# Patient Record
Sex: Female | Born: 1950
Health system: Southern US, Community
[De-identification: ages and names within clinical notes are randomized; demographics above are authoritative.]

## PROBLEM LIST (undated history)

## (undated) DIAGNOSIS — M549 Dorsalgia, unspecified: Secondary | ICD-10-CM

## (undated) DIAGNOSIS — M797 Fibromyalgia: Secondary | ICD-10-CM

## (undated) DIAGNOSIS — F329 Major depressive disorder, single episode, unspecified: Secondary | ICD-10-CM

## (undated) DIAGNOSIS — K279 Peptic ulcer, site unspecified, unspecified as acute or chronic, without hemorrhage or perforation: Secondary | ICD-10-CM

## (undated) DIAGNOSIS — E669 Obesity, unspecified: Secondary | ICD-10-CM

## (undated) DIAGNOSIS — M25569 Pain in unspecified knee: Secondary | ICD-10-CM

## (undated) DIAGNOSIS — M1990 Unspecified osteoarthritis, unspecified site: Secondary | ICD-10-CM

## (undated) DIAGNOSIS — I10 Essential (primary) hypertension: Secondary | ICD-10-CM

## (undated) DIAGNOSIS — E7800 Pure hypercholesterolemia, unspecified: Secondary | ICD-10-CM

## (undated) DIAGNOSIS — G8929 Other chronic pain: Secondary | ICD-10-CM

## (undated) DIAGNOSIS — E119 Type 2 diabetes mellitus without complications: Secondary | ICD-10-CM

## (undated) DIAGNOSIS — F32A Depression, unspecified: Secondary | ICD-10-CM

## (undated) DIAGNOSIS — I471 Supraventricular tachycardia, unspecified: Secondary | ICD-10-CM

## (undated) DIAGNOSIS — F419 Anxiety disorder, unspecified: Secondary | ICD-10-CM

## (undated) DIAGNOSIS — J660 Byssinosis: Secondary | ICD-10-CM

## (undated) DIAGNOSIS — K219 Gastro-esophageal reflux disease without esophagitis: Secondary | ICD-10-CM

## (undated) DIAGNOSIS — E876 Hypokalemia: Secondary | ICD-10-CM

## (undated) DIAGNOSIS — C801 Malignant (primary) neoplasm, unspecified: Secondary | ICD-10-CM

## (undated) HISTORY — DX: Hypokalemia: E87.6

## (undated) HISTORY — PX: GANGLION CYST EXCISION: SHX1691

## (undated) HISTORY — DX: Dorsalgia, unspecified: M54.9

## (undated) HISTORY — DX: Peptic ulcer, site unspecified, unspecified as acute or chronic, without hemorrhage or perforation: K27.9

## (undated) HISTORY — DX: Depression, unspecified: F32.A

## (undated) HISTORY — DX: Supraventricular tachycardia, unspecified: I47.10

## (undated) HISTORY — DX: Obesity, unspecified: E66.9

## (undated) HISTORY — DX: Byssinosis: J66.0

## (undated) HISTORY — DX: Hypomagnesemia: E83.42

## (undated) HISTORY — PX: BREAST SURGERY: SHX581

## (undated) HISTORY — DX: Pure hypercholesterolemia, unspecified: E78.00

## (undated) HISTORY — DX: Supraventricular tachycardia: I47.1

## (undated) HISTORY — PX: TOTAL ABDOMINAL HYSTERECTOMY: SHX209

## (undated) HISTORY — DX: Other chronic pain: G89.29

## (undated) HISTORY — PX: DILATION AND CURETTAGE OF UTERUS: SHX78

## (undated) HISTORY — DX: Fibromyalgia: M79.7

## (undated) HISTORY — PX: APPENDECTOMY: SHX54

## (undated) HISTORY — PX: OTHER SURGICAL HISTORY: SHX169

## (undated) HISTORY — DX: Pain in unspecified knee: M25.569

## (undated) HISTORY — DX: Essential (primary) hypertension: I10

## (undated) HISTORY — DX: Anxiety disorder, unspecified: F41.9

## (undated) HISTORY — DX: Major depressive disorder, single episode, unspecified: F32.9

## (undated) HISTORY — DX: Gastro-esophageal reflux disease without esophagitis: K21.9

## (undated) HISTORY — PX: CHOLECYSTECTOMY: SHX55

---

## 1998-08-16 ENCOUNTER — Other Ambulatory Visit: Admission: RE | Admit: 1998-08-16 | Discharge: 1998-08-16 | Payer: Self-pay | Admitting: General Surgery

## 2002-03-08 ENCOUNTER — Emergency Department (HOSPITAL_COMMUNITY): Admission: EM | Admit: 2002-03-08 | Discharge: 2002-03-08 | Payer: Self-pay | Admitting: Emergency Medicine

## 2004-04-26 ENCOUNTER — Inpatient Hospital Stay (HOSPITAL_BASED_OUTPATIENT_CLINIC_OR_DEPARTMENT_OTHER): Admission: RE | Admit: 2004-04-26 | Discharge: 2004-04-26 | Payer: Self-pay | Admitting: Cardiovascular Disease

## 2005-05-31 ENCOUNTER — Ambulatory Visit: Payer: Self-pay | Admitting: Gastroenterology

## 2005-06-10 ENCOUNTER — Ambulatory Visit: Payer: Self-pay | Admitting: Internal Medicine

## 2005-06-10 ENCOUNTER — Ambulatory Visit (HOSPITAL_COMMUNITY): Admission: RE | Admit: 2005-06-10 | Discharge: 2005-06-10 | Payer: Self-pay | Admitting: Internal Medicine

## 2005-06-10 ENCOUNTER — Encounter (INDEPENDENT_AMBULATORY_CARE_PROVIDER_SITE_OTHER): Payer: Self-pay | Admitting: Internal Medicine

## 2005-07-01 ENCOUNTER — Ambulatory Visit: Payer: Self-pay | Admitting: Cardiology

## 2005-07-22 ENCOUNTER — Ambulatory Visit (HOSPITAL_COMMUNITY): Admission: RE | Admit: 2005-07-22 | Discharge: 2005-07-22 | Payer: Self-pay | Admitting: Internal Medicine

## 2007-12-03 ENCOUNTER — Ambulatory Visit (HOSPITAL_COMMUNITY): Admission: RE | Admit: 2007-12-03 | Discharge: 2007-12-03 | Payer: Self-pay | Admitting: Orthopaedic Surgery

## 2008-12-27 ENCOUNTER — Ambulatory Visit (HOSPITAL_COMMUNITY): Admission: RE | Admit: 2008-12-27 | Discharge: 2008-12-27 | Payer: Self-pay | Admitting: Obstetrics & Gynecology

## 2009-01-16 ENCOUNTER — Ambulatory Visit: Payer: Self-pay | Admitting: Cardiology

## 2009-01-17 ENCOUNTER — Ambulatory Visit: Payer: Self-pay | Admitting: Cardiology

## 2009-01-17 ENCOUNTER — Ambulatory Visit (HOSPITAL_COMMUNITY): Admission: AD | Admit: 2009-01-17 | Discharge: 2009-01-17 | Payer: Self-pay | Admitting: Cardiology

## 2009-07-18 ENCOUNTER — Emergency Department (HOSPITAL_COMMUNITY): Admission: EM | Admit: 2009-07-18 | Discharge: 2009-07-18 | Payer: Self-pay | Admitting: Emergency Medicine

## 2010-12-16 ENCOUNTER — Encounter (INDEPENDENT_AMBULATORY_CARE_PROVIDER_SITE_OTHER): Payer: Self-pay | Admitting: Internal Medicine

## 2011-03-02 LAB — POCT CARDIAC MARKERS
Myoglobin, poc: 77.8 ng/mL (ref 12–200)
Troponin i, poc: <0.05 ng/mL (ref 0.00–0.09)

## 2011-03-12 LAB — GLUCOSE, CAPILLARY: Glucose-Capillary: 186 mg/dL — ABNORMAL HIGH (ref 70–99)

## 2011-04-09 NOTE — Discharge Summary (Signed)
Kelli Wilson, Kelli Wilson              ACCOUNT NO.:  [id number]   MEDICAL RECORD NO.:  [id number]          PATIENT TYPE:  OIB   LOCATION:  2899                         FACILITY:  [facility]   PHYSICIAN:  Vanna Scotland. Olevia Perches, MD, FACCDATE OF BIRTH:  04-Aug-1951   DATE OF ADMISSION:  01/16/2009  DATE OF DISCHARGE:  01/17/2009                               DISCHARGE SUMMARY   PRIMARY CARDIOLOGIST:  Satira Sark, MD   DISCHARGE DIAGNOSIS:  Noncardiac chest pain.   SECONDARY DIAGNOSES:  1. Chronic obstructive pulmonary disease.  2. Borderline diabetes.  3. Hypokalemia.  4. Morbid obesity.  5. Gastroesophageal reflux disease.  6. Osteoarthritis.  7. Fibromyalgia.   ALLERGIES:  1. TRAMADOL  2. MORPHINE.  3. LIPITOR.  4. PREVACID  5. SULFONAMIDE.  6. NSAIDs.  7. FLEXERIL.  8. SULFA.   PROCEDURES PERFORMED DURING THIS HOSPITALIZATION:  1. EKG showed normal sinus rhythm with a rate of 78 beats per minute.      No acute ST-T wave changes.  No significant Q-waves.  Normal axis.      No evidence of hypertrophy.  PR 132.  QRS 92.  QTc 433.  2. Cardiac catheterization on January 17, 2009, that revealed normal      coronaries and normal LV function.   BRIEF HISTORY OF PRESENT ILLNESS:  This is a 60 year old white female  with a history of diabetes, hypertension, dyslipidemia, morbid obesity,  and COPD. She came to the emergency room at [facility] on  January 16, 2009, complaining of chest pain that was sharp, pressure-  like, radiating to the left arm, lasting for a few minutes, resolving  and then returning.  No associated diaphoresis.  Not exacerbated by  exertion or rest.  The patient reports good medication compliance.   SURGICAL HISTORY:  1. Appendectomy.  2. Cholecystectomy.  3. Hysterectomy.   HOSPITAL COURSE:  The patient was transferred from [facility] to [facility] to undergo diagnostic cardiac catheterization.  The  patient tolerated the procedure well  without any significant  complications.  The patient was found to have normal coronaries and  normal LV function.  Please see results above.  The patient will be  transferred from to the Short Stay and will be discharged at 6:30 p.m.  pending her ability to ambulate without significant symptoms or any  other complications.  Prior to discharge, the patient will be given her  post-cath instructions as well as followup instructions to see her  primary care within the next 2 weeks.  All of her questions or concerns  will be addressed prior to her discharge. (Should any changes, occur  please see addendum.)   DISCHARGE LABORATORY DATA:  CK 91, CK-MB 1.0, and troponin I of less  than 0.01.  PT 13.0, INR of 1.0, and aPTT 26.0.  WBC 13.3, HGB 14.6, HCT  41.5, and PLT count 314.  Sodium 137, potassium 3.3, chloride 101, CO2  26.0, BUN 12, creatinine 1.10, and glucose 188.   FOLLOWUP PLANS AND APPOINTMENTS:  The patient will be instructed to make  a followup appointment with her primary care  doctor within the next 2  weeks.  The patient will be scheduled for a post-cath followup  appointment with Dr. Domenic Polite within the next 2-3 weeks.   DISCHARGE MEDICATIONS:  No changes.  1. Alprazolam 1 mg p.o. q.i.d. p.r.n.  2. Advair Diskus 1 inhalation b.i.d.  3. Albuterol nebulizer 1 vial q.4 h. p.r.n.  4. Vicodin 5/325 mg p.o. p.r.n.  5. Vistaril 25 mg p.o. q.4 h.  6. Protonix 40 mg p.o. daily.  7. DuoNeb 1 inhalation q.4 h. p.r.n.   Duration of discharge encounter including physician time was 35 minutes.      Guss Bunde, PAC      Bruce R. Olevia Perches, MD, [facility]  Electronically Signed    MS/MEDQ  D:  01/17/2009  T:  01/18/2009  Job:  [id number]   cc:   Satira Sark, MD

## 2011-04-12 NOTE — Op Note (Signed)
NAMEDELANNY, [name]   MEDICAL RECORD NO.:  [id number]          PATIENT TYPE:  AMB   LOCATION:  DAY                           FACILITY:  [facility]   PHYSICIAN:  Hildred Laser, M.D.    DATE OF BIRTH:  11/20/1951   DATE OF PROCEDURE:  06/10/2005  DATE OF DISCHARGE:                                 OPERATIVE REPORT   PROCEDURE:  Esophagogastroduodenoscopy followed by colonoscopy.   INDICATION:  Kelli Wilson is a 60 year old Caucasian female with multiple medical  problems with chronic GERD with frequent breakthrough symptoms.  She also  has intermittent nausea, vomiting, as well as epigastric and left upper  quadrant pain.  She also has noted change in her bowel habits.  Some days  she has diarrhea.  Other days she feels she is constipated and she also  passes pencil thin stools and gives history of melena.  She had colonic  adenoma removed in 02/2001.   Procedure risks were reviewed with the patient and informed consent was  obtained.   PREOPERATIVE MEDICATIONS:  Cetacaine spray pharyngeal topical anesthesia,  Demerol 50 mg IV, Versed 17 mg IV.   FINDINGS/PROCEDURES PERFORMED IN ENDOSCOPY SUITE:  The patient's vital signs  and O2 sat were monitored during the procedure and remained stable.   PROCEDURE:  1.  Esophagogastroduodenoscopy.  The patient was placed in the left lateral      position and Olympus videoscope was passed into the oropharynx without      any difficulty into esophagus.   Esophagus:  Mucosa of the esophagus normal.  There was a diamond shaped  patch of gastric type mucosa at distal esophagus which was biopsied for  histology to make sure this was not early Barrett's.  GE junction at 39 and  hiatus was at 41 cm.   Stomach:  It was empty and distended very well with insufflation.  Folds of  proximal stomach are normal.  Examination of mucosa of body, antrum, pyloric  channel as well as angularis, fundus and cardia was normal.   Duodenum:  Bulbar mucosa was normal.  The scope was passed to the second  bulb of the duodenal mucosa and folds were normal.  Endoscope was withdrawn  and the patient prepared for procedure number two.   Colonoscopy:  Rectal exam was performed.  No abnormality noted on external  or digital exam.  Olympus video scope was placed in the rectum and advanced  under vision into the sigmoid colon beyond.  Redundant colon with suboptimal  prep.  She still had some thick liquid stool.  A few diverticula were noted  at the sigmoid descending colon and some at the hepatic flexure ascending  colon.  I was able to see the valve from a distance but never could see the  blunt end.  The patient was turned in different positions.  She was  constantly complaining of pain or cramps during colonoscopy and was never  well sedated.  As the scope was withdrawn, colonic mucosa was carefully  examined and there were no other abnormalities.  Rectal mucosa was normal.  The  scope was retroflexed to examine anorectal junction which was  unremarkable.  The endoscope was then withdrawn.  The patient appeared to be  comfortable at the end of the procedure.   FINAL DIAGNOSES:  1.Small sliding hiatal hernia, small diamond shaped patch  of gastric type mucosa distal esophagus which was biopsied for histology to  make sure she does not have Barrett's.  1.  No evidence of peptic ulcer disease.  2.  Scattered diverticula at sigmoid, descending colon as well as the      ascending colon and hepatic flexure.  3.  blunt end of cecum was not seen.   RECOMMENDATIONS:  1.  She will continue antireflux measures and a PPI as before.  2.  Dicyclomine 20 mg before each meal.  3.  FiberChoice two tablets q.d.  4.  Will bring her back for barium enema at a later date.       NR/MEDQ  D:  06/10/2005  T:  06/10/2005  Job:  [id number]   cc:   Ara Kussmaul  [place] [id number]  Fax: [phone]

## 2011-04-12 NOTE — Consult Note (Signed)
[name], [name]              ACCOUNT NO.:  [id number]   MEDICAL RECORD NO.:  [id number]         PATIENT TYPE:  AMB   LOCATION:                                FACILITY:  [facility]   PHYSICIAN:  Hildred Laser, M.D.    DATE OF BIRTH:  1951-08-09   DATE OF CONSULTATION:  05/31/2005  DATE OF DISCHARGE:                                   CONSULTATION   CHIEF COMPLAINT:  Acid reflux, history of polyps.   HISTORY OF PRESENT ILLNESS:  The patient is a 60 year old Caucasian female  patient of Dr. Wiliam Ke who presents today for further evaluation of  above-stated symptoms.  She has had chronic gastroesophageal reflux disease.  Over the last 2-3 months she has had a flare-up of her symptoms.  She also  has nausea and vomiting intermittently and has been taking her daughter's  Reglan with some relief.  She often wakes up in the morning with nausea.  The smell of food makes her nauseated.  On occasion she does vomit.  She has  been having some left upper quadrant swelling and discomfort which is often  followed by abdominal cramps and diarrhea.  She often breaks out in a cold  sweat when she gets severe abdominal cramps.  She has fecal urgency.  She  complains of nocturnal acid reflux.  She has been sleeping on three pillows  underneath her head.  She notes dark carbonated beverages cause acid reflux.  For the most part her heartburn is controlled on Protonix which she takes  most days.  Her bowel movements are becoming worse over the last several  months.  She may go from having just one bowel movement a day to having  multiple postprandial stools.  Her stool caliber is pencil thin.  She states  she has black stools intermittently, especially when she has abdominal pain.  She denies any heartburn.   CURRENT MEDICATIONS:  1.  Serevent p.r.n.  2.  Flovent p.r.n.  3.  Albuterol inhaler p.r.n.  4.  Advair 2 puffs b.i.d.  5.  Protonix 40 mg daily p.r.n.  6.  Reglan 5 mg every other day.  7.  Xanax 1 mg q.i.d.  8.  Darvocet-N 100 p.r.n.   ALLERGIES:  MOTRIN, SULFA, CHOLESTEROL MEDICATIONS.   PAST MEDICAL HISTORY:  COPD.  Hypertension.  Borderline diabetes.  Fibromyalgia.  Gastroesophageal reflux disease.  Arthritis.  Depression.  Obesity.  History of tubular adenoma removed in April 2002 from the cecum;  it measured 5-6 mm.  She has also had EGD, last time in 2002, which revealed  erosive reflux esophagitis and a small sliding hiatal hernia but no Barrett  esophagus.   PAST SURGICAL HISTORY:  D&C.  Hysterectomy.  Appendectomy.  Cholecystectomy.  Rectocele and cystocele repair.   FAMILY HISTORY:  She is not aware of her biological family's history; she is  adopted.   SOCIAL HISTORY:  She is married and has 3 living children.  She is disabled.  She has never been a smoker, but her husband smokes around her.  No alcohol  use.   REVIEW OF  SYSTEMS:  See HPI for GI.  CARDIOPULMONARY:  She has intermittent  shortness of breath with asthma attacks especially set off by aerosol sprays  and change in weather.  She denies any chest pain.  CONSTITUTIONAL:  Denies  weight loss.   PHYSICAL EXAMINATION:  VITAL SIGNS:  Weight 256, height 5 feet 9-1/2 inches,  temperature 98.1, blood pressure 134/86, pulse 84.  GENERAL:  Pleasant, obese, Caucasian female in no acute distress.  SKIN:  Warm and dry, no jaundice.  HEENT:  Pupils are equal, round and reactive to light.  Conjunctivae are  pink.  Sclerae are nonicteric.  Oropharyngeal mucosa moist and pink, no  lesions, erythema or exudate.  No lymphadenopathy or thyromegaly.  CHEST:  Lungs are clear to auscultation.  CARDIAC:  Reveals a regular rate and rhythm, normal S1 and S2, no murmurs,  rubs or gallops.  ABDOMEN:  Positive bowel sounds, obese but symmetrical.  Soft.  Mild diffuse  abdominal tenderness to deep palpation, no organomegaly or masses, no  rebound tenderness or guarding, no abdominal bruits or hernias.  RECTAL:   Deferred per patient's request.  EXTREMITIES:  No edema.   IMPRESSION:  Patient is a 60 year old lady lady with multiple gastrointestinal  issues including chronic gastroesophageal reflux disease with intermittent  nausea, vomiting and upper abdominal discomfort.  She also has intermittent  postprandial diarrhea and subjective change in stool caliber.  She reports  having intermittent black stools associated with abdominal pain as well.  She has a history of tubular adenoma and recommendations have been for  followup colonoscopy in 6 years which would be early 2008.  Given change in  bowel pattern and patient's request for surveillance exam, it is reasonable  to proceed with colonoscopy at this time.  She also needs to have an upper  endoscopy for further evaluation of her GI symptoms and melena.   PLAN:  Colonoscopy and EGD in the near future.  Would have her stop Protonix  and try Zegerid 40 mg daily #20 samples given.      LL/MEDQ  D:  05/31/2005  T:  05/31/2005  Job:  [id number]   cc:   Wiliam Ke  [place] [id number]  Fax: [phone]

## 2011-04-12 NOTE — Cardiovascular Report (Signed)
NAMECHAYLIN, Kelli Wilson NO.:  [id number]   MEDICAL RECORD NO.:  [id number]                   PATIENT TYPE:  OIB   LOCATION:  6501                                 FACILITY:  [facility]   PHYSICIAN:  Jenkins Rouge, M.D.                  DATE OF BIRTH:  1951/11/05   DATE OF PROCEDURE:  04/26/2004  DATE OF DISCHARGE:                              CARDIAC CATHETERIZATION   INDICATION:  Recurrent chest pain.   PROCEDURE:  Standard catheterization was done from the right femoral artery.  The patient was sedated with 2 mg of Versed.  4 French catheters were used.   The left main coronary artery was normal.   Left anterior descending artery was normal.  There was one large first  diagonal branch which was normal.   Circumflex coronary artery was normal.   Right coronary artery was large and dominant.  It was normal.   PRESSURES:  Aortic pressure was 137/80, LV pressure was 136/2.   There was no significant gradient across the aortic valve and no MR.   IMPRESSION:  The patient has no critical coronary artery disease.  She will  follow up with her general medical care with Dr. __________.  She will see  Roque Cash in the [facility] on Monday for groin check.   The patient tolerated the procedure well.                                               Jenkins Rouge, M.D.    PN/MEDQ  D:  04/26/2004  T:  04/26/2004  Job:  [id number]   cc:   Dr. Susette Racer R. Duran, P.A.

## 2011-08-07 ENCOUNTER — Encounter (INDEPENDENT_AMBULATORY_CARE_PROVIDER_SITE_OTHER): Payer: Self-pay

## 2011-08-07 ENCOUNTER — Encounter (INDEPENDENT_AMBULATORY_CARE_PROVIDER_SITE_OTHER): Payer: Self-pay | Admitting: *Deleted

## 2011-10-01 ENCOUNTER — Ambulatory Visit (INDEPENDENT_AMBULATORY_CARE_PROVIDER_SITE_OTHER): Payer: Self-pay | Admitting: Internal Medicine

## 2012-02-11 DIAGNOSIS — I10 Essential (primary) hypertension: Secondary | ICD-10-CM | POA: Insufficient documentation

## 2012-02-11 DIAGNOSIS — F329 Major depressive disorder, single episode, unspecified: Secondary | ICD-10-CM

## 2012-02-11 DIAGNOSIS — J660 Byssinosis: Secondary | ICD-10-CM

## 2012-02-11 DIAGNOSIS — E7800 Pure hypercholesterolemia, unspecified: Secondary | ICD-10-CM

## 2012-02-11 DIAGNOSIS — M797 Fibromyalgia: Secondary | ICD-10-CM

## 2012-02-11 DIAGNOSIS — F419 Anxiety disorder, unspecified: Secondary | ICD-10-CM | POA: Insufficient documentation

## 2012-02-11 DIAGNOSIS — K219 Gastro-esophageal reflux disease without esophagitis: Secondary | ICD-10-CM | POA: Insufficient documentation

## 2013-01-28 ENCOUNTER — Encounter: Payer: Self-pay | Admitting: Cardiovascular Disease

## 2015-12-28 ENCOUNTER — Telehealth: Payer: Self-pay | Admitting: *Deleted

## 2015-12-28 MED ORDER — HYDROCODONE-ACETAMINOPHEN 7.5-325 MG PO TABS: 1.0000 | ORAL_TABLET | ORAL | Status: DC | PRN

## 2015-12-28 NOTE — Telephone Encounter (Signed)
Requesting refill on Hydrocodone. °

## 2015-12-28 NOTE — Telephone Encounter (Signed)
Rx printed

## 2015-12-28 NOTE — Addendum Note (Signed)
Addended by: Sanjuana Kava on: 12/28/2015 11:27 AM   Modules accepted: Orders

## 2016-01-29 ENCOUNTER — Telehealth: Payer: Self-pay | Admitting: Orthopaedic Surgery

## 2016-01-29 MED ORDER — HYDROCODONE-ACETAMINOPHEN 7.5-325 MG PO TABS: 1.0000 | ORAL_TABLET | ORAL | Status: DC | PRN

## 2016-01-29 NOTE — Telephone Encounter (Signed)
Rx done. 

## 2016-01-29 NOTE — Telephone Encounter (Signed)
Norco 7.5-325 mgs. Qty 120  Last filled on 12-28-15

## 2016-02-27 ENCOUNTER — Telehealth: Payer: Self-pay

## 2016-02-27 NOTE — Telephone Encounter (Signed)
Pt needs refill on pain med 7.5 325 Hydro

## 2016-02-28 MED ORDER — HYDROCODONE-ACETAMINOPHEN 7.5-325 MG PO TABS: 1.0000 | ORAL_TABLET | ORAL | Status: DC | PRN

## 2016-02-28 NOTE — Telephone Encounter (Signed)
Rx done. 

## 2016-03-12 ENCOUNTER — Ambulatory Visit (INDEPENDENT_AMBULATORY_CARE_PROVIDER_SITE_OTHER): Payer: Medicare Other | Admitting: Orthopaedic Surgery

## 2016-03-12 ENCOUNTER — Encounter: Payer: Self-pay | Admitting: Orthopaedic Surgery

## 2016-03-12 VITALS — BP 178/91 | HR 87 | Temp 98.2°F | Ht 67.5 in | Wt 291.0 lb

## 2016-03-12 DIAGNOSIS — M545 Low back pain, unspecified: Secondary | ICD-10-CM

## 2016-03-12 DIAGNOSIS — K219 Gastro-esophageal reflux disease without esophagitis: Secondary | ICD-10-CM | POA: Diagnosis not present

## 2016-03-12 DIAGNOSIS — M797 Fibromyalgia: Secondary | ICD-10-CM

## 2016-03-12 NOTE — Progress Notes (Signed)
Subjective: my back is still hurting    Patient ID: Kelli Wilson, female    DOB: 1951-11-18, 65 y.o.   MRN: [id number]  Back Pain This is a chronic problem. The current episode started more than 1 year ago. The problem occurs daily. The problem has been waxing and waning since onset. The pain is present in the lumbar spine. The quality of the pain is described as aching. The pain does not radiate. The pain is at a severity of 4/10. The pain is moderate. The pain is worse during the day. The symptoms are aggravated by bending, twisting and stress. Pertinent negatives include no chest pain. She has tried analgesics, heat, bed rest, home exercises, ice, muscle relaxant and NSAIDs for the symptoms. The treatment provided moderate relief.   She has chronic pain of the lower back with no paresthesias.  She has been doing her exercises.  She has no new trauma.  Her diabetes is well controlled.  Her last A1C was just over 6 she says.  Her COPD has good and bad days depending on the weather and her activity. The pollen has bothered her allergies and her breathing as well.  Review of Systems  Constitutional:       She has diabetes diet controlled. She has COPD  HENT: Positive for congestion.   Respiratory: Positive for shortness of breath. Negative for cough.   Cardiovascular: Negative for chest pain and leg swelling.  Endocrine: Positive for cold intolerance.  Musculoskeletal: Positive for myalgias, back pain and arthralgias.  Allergic/Immunologic: Positive for environmental allergies and food allergies.   Past Medical History  Diagnosis Date  . Hypertension   . Obesity   . Chronic pain   . Anxiety   . Knee pain   . Back pain   . Chest pain   . Hypercholesteremia   . Depression   . Fibromyalgia   . Peptic ulcer     history of - normal GI studies Sept 2010  . GERD (gastroesophageal reflux disease)   . 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 Total abdominal hysterectomy    . Cholecystectomy    . Appendectomy    . Dilation and curettage of uterus    . Bladder rectal repair      Current Outpatient Prescriptions on File Prior to Visit  Medication Sig Dispense Refill  . albuterol (PROVENTIL) (2.5 MG/3ML) 0.083% nebulizer solution Take by nebulization every 6 (six) hours as needed.      . Fluticasone-Salmeterol (ADVAIR) 100-50 MCG/DOSE AEPB Inhale 1 puff into the lungs every 12 (twelve) hours.    Marland Kitchen HYDROcodone-acetaminophen (NORCO) 7.5-325 MG tablet Take 1 tablet by mouth every 4 (four) hours as needed for moderate pain. 120 tablet 0  . hydrOXYzine (ATARAX/VISTARIL) 50 MG tablet Take 100 mg by mouth at bedtime.    . nitroGLYCERIN (NITRODUR - DOSED IN MG/24 HR) 0.2 mg/hr Place 1 patch onto the skin as needed.    Marland Kitchen omeprazole (PRILOSEC) 40 MG capsule Take 40 mg by mouth daily.    Marland Kitchen oxybutynin (DITROPAN) 5 MG tablet Take 5 mg by mouth daily.    . prochlorperazine (COMPAZINE) 10 MG tablet Take 10 mg by mouth every 6 (six) hours as needed.      . citalopram (CELEXA) 10 MG tablet Take 10 mg by mouth daily. Reported on 03/12/2016    . diltiazem (CARDIZEM CD)  240 MG 24 hr capsule Take 120 mg by mouth daily. Reported on 03/12/2016    . gabapentin (NEURONTIN) 300 MG capsule Take 300 mg by mouth 3 (three) times daily. Reported on 03/12/2016     No current facility-administered medications on file prior to visit.    Social History   Social History  . Marital Status: Married    Spouse Name: N/A  . Number of Children: N/A  . Years of Education: N/A   Occupational History  . disabled     disabled from a cotton mill (Byssinosis)   Social History Main Topics  . Smoking status: Never Smoker   . Smokeless tobacco: Never Used  . Alcohol Use: No  . Drug Use: Not on file  . Sexual Activity: Not on file   Other Topics Concern  . Not on file   Social History Narrative    BP 178/91 mmHg  Pulse 87   Temp(Src) 98.2 F (36.8 C)  Ht 5' 7.5" (1.715 m)  Wt 291 lb (131.997 kg)  BMI 44.88 kg/m2     Objective:   Physical Exam  Constitutional: She is oriented to person, place, and time. She appears well-developed and well-nourished.  HENT:  Head: Normocephalic and atraumatic.  Eyes: Conjunctivae and EOM are normal. Pupils are equal, round, and reactive to light.  Neck: Normal range of motion. Neck supple.  Cardiovascular: Normal rate, regular rhythm and intact distal pulses.   Pulmonary/Chest: Effort normal.  Abdominal: Soft.  Musculoskeletal: She exhibits tenderness (lower back is tender.  Forward flexion 30, extension 5, lateral bend normal, SLR normal, no spasm.  NV intact.).       Lumbar back: She exhibits decreased range of motion and tenderness.       Back:  Neurological: She is alert and oriented to person, place, and time. She displays normal reflexes. No cranial nerve deficit. She exhibits normal muscle tone. Coordination normal.  Skin: Skin is warm and dry.  Psychiatric: She has a normal mood and affect. Her behavior is normal. Judgment and thought content normal.      Assessment & Plan:   Encounter Diagnoses  Name Primary?  . Midline low back pain without sciatica Yes  . Fibromyalgia   . Gastroesophageal reflux disease, esophagitis presence not specified   . Morbid obesity due to excess calories [facility])    Continue her medicine.  Call if any problem.  Continue her exercises at home.  Return in three months.

## 2016-03-27 ENCOUNTER — Telehealth: Payer: Self-pay | Admitting: Orthopaedic Surgery

## 2016-03-27 MED ORDER — HYDROCODONE-ACETAMINOPHEN 7.5-325 MG PO TABS: 1.0000 | ORAL_TABLET | ORAL | Status: DC | PRN

## 2016-03-27 NOTE — Telephone Encounter (Signed)
Rx done. 

## 2016-03-27 NOTE — Telephone Encounter (Signed)
Hydrocodone(Norco) 7.5-325 mgs.  Qty 120   Sig: Take 1 tablet by mouth every 4 (four) hours as needed for moderate pain.

## 2016-04-29 ENCOUNTER — Telehealth: Payer: Self-pay | Admitting: Orthopaedic Surgery

## 2016-04-29 NOTE — Telephone Encounter (Signed)
Patient called for refill of medication: HYDROcodone-acetaminophen (NORCO) 7.5-325 MG tablet [id number] - quantity 120.

## 2016-04-30 MED ORDER — HYDROCODONE-ACETAMINOPHEN 7.5-325 MG PO TABS: 1.0000 | ORAL_TABLET | ORAL | Status: DC | PRN

## 2016-04-30 NOTE — Telephone Encounter (Signed)
Rx done. 

## 2016-05-29 ENCOUNTER — Telehealth: Payer: Self-pay | Admitting: Orthopaedic Surgery

## 2016-05-29 MED ORDER — HYDROCODONE-ACETAMINOPHEN 7.5-325 MG PO TABS: 1.0000 | ORAL_TABLET | ORAL | Status: DC | PRN

## 2016-05-29 NOTE — Telephone Encounter (Signed)
Rx done. 

## 2016-05-29 NOTE — Telephone Encounter (Signed)
Patient requests refill on: HYDROcodone-acetaminophen (NORCO) 7.5-325 MG tablet [id number]

## 2016-06-11 ENCOUNTER — Ambulatory Visit: Payer: Medicaid Other | Admitting: Orthopaedic Surgery

## 2016-06-27 ENCOUNTER — Ambulatory Visit (INDEPENDENT_AMBULATORY_CARE_PROVIDER_SITE_OTHER): Payer: Medicaid Other | Admitting: Orthopaedic Surgery

## 2016-06-27 ENCOUNTER — Encounter: Payer: Self-pay | Admitting: Orthopaedic Surgery

## 2016-06-27 VITALS — BP 139/89 | HR 102 | Ht 67.5 in

## 2016-06-27 DIAGNOSIS — M545 Low back pain, unspecified: Secondary | ICD-10-CM

## 2016-06-27 DIAGNOSIS — K219 Gastro-esophageal reflux disease without esophagitis: Secondary | ICD-10-CM

## 2016-06-27 DIAGNOSIS — M797 Fibromyalgia: Secondary | ICD-10-CM | POA: Diagnosis not present

## 2016-06-27 MED ORDER — HYDROCODONE-ACETAMINOPHEN 7.5-325 MG PO TABS: 1.0000 | ORAL_TABLET | ORAL | 0 refills | Status: DC | PRN

## 2016-06-27 NOTE — Progress Notes (Signed)
Patient [id number] [name], female DOB:02/03/1951, 65 y.o. [id number]  Chief Complaint  Patient presents with  . Follow-up    Back and hip    HPI  Kelli Wilson is a 65 y.o. female who has chronic back pain and has developed some neck pain.  She has no new trauma, has no paresthesias.  She has been active and doing exercises. HPI  There is no height or weight on file to calculate BMI.  ROS  Review of Systems  Constitutional:       She has diabetes diet controlled. She has COPD  HENT: Positive for congestion.   Respiratory: Positive for shortness of breath. Negative for cough.   Cardiovascular: Negative for chest pain and leg swelling.  Endocrine: Positive for cold intolerance.  Musculoskeletal: Positive for arthralgias, back pain and myalgias.  Allergic/Immunologic: Positive for environmental allergies and food allergies.    Past Medical History:  Diagnosis Date  . Anxiety   . Back pain   . 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 Chest pain   . Chronic pain   . Depression   . Fibromyalgia   . GERD (gastroesophageal reflux disease)   . Hypercholesteremia   . Hypertension   . Knee pain   . Obesity   . Peptic ulcer    history of - normal GI studies Sept 2010    Past Surgical History:  Procedure Laterality Date  . APPENDECTOMY    . bladder rectal repair    . CHOLECYSTECTOMY    . DILATION AND CURETTAGE OF UTERUS    . TOTAL ABDOMINAL HYSTERECTOMY      Family History  Problem Relation Age of Onset  . Aneurysm Father     Social History Social History  Substance Use Topics  . Smoking status: Never Smoker  . Smokeless tobacco: Never Used  . Alcohol use No    Allergies  Allergen Reactions  . Other Anaphylaxis    Lysol and other cleaning products  . Iohexol      Code: HIVES, Desc: PT STATES SHE WAS GIVEN IV DYE AT [facility] AND BROKE OUT IN HIVES, RASH, AND HAD TO BE HOSPITALIZED DUE TO REACTION   . Ivp Dye [Iodinated  Diagnostic Agents]   . Motrin [Ibuprofen]   . Nsaids Other (See Comments)  . Statins   . Sulfa Antibiotics     Current Outpatient Prescriptions  Medication Sig Dispense Refill  . albuterol (PROVENTIL) (2.5 MG/3ML) 0.083% nebulizer solution Take by nebulization every 6 (six) hours as needed.      Marland Kitchen aspirin 81 MG tablet Take 81 mg by mouth every other day.    . citalopram (CELEXA) 10 MG tablet Take 10 mg by mouth daily. Reported on 03/12/2016    . diltiazem (CARDIZEM CD) 240 MG 24 hr capsule Take 120 mg by mouth daily. Reported on 03/12/2016    . Fluticasone-Salmeterol (ADVAIR) 100-50 MCG/DOSE AEPB Inhale 1 puff into the lungs every 12 (twelve) hours.    . gabapentin (NEURONTIN) 300 MG capsule Take 300 mg by mouth 3 (three) times daily. Reported on 03/12/2016    . HYDROcodone-acetaminophen (NORCO) 7.5-325 MG tablet Take 1 tablet by mouth every 4 (four) hours as needed for moderate pain. 120 tablet 0  . hydrOXYzine (ATARAX/VISTARIL) 50 MG tablet Take 100 mg by mouth at bedtime.    . metFORMIN (GLUCOPHAGE) 500 MG tablet Take by mouth 2 (two) times daily with a meal. Takes one in the morning  and 1/2 at night    . nitroGLYCERIN (NITRODUR - DOSED IN MG/24 HR) 0.2 mg/hr Place 1 patch onto the skin as needed.    Marland Kitchen omeprazole (PRILOSEC) 40 MG capsule Take 40 mg by mouth daily.    Marland Kitchen oxybutynin (DITROPAN) 5 MG tablet Take 5 mg by mouth daily.    . pioglitazone (ACTOS) 15 MG tablet Take 15 mg by mouth daily.    . prochlorperazine (COMPAZINE) 10 MG tablet Take 10 mg by mouth every 6 (six) hours as needed.       No current facility-administered medications for this visit.      Physical Exam  Blood pressure 139/89, pulse (!) 102, height 5' 7.5" (1.715 m).  Constitutional: overall normal hygiene, normal nutrition, well developed, normal grooming, normal body habitus. Assistive device:none  Musculoskeletal: gait and station Limp none, muscle tone and strength are normal, no tremors or atrophy is  present.  .  Neurological: coordination overall normal.  Deep tendon reflex/nerve stretch intact.  Sensation normal.  Cranial nerves II-XII intact.   Skin:   normal overall no scars, lesions, ulcers or rashes. No psoriasis.  Psychiatric: Alert and oriented x 3.  Recent memory intact, remote memory unclear.  Normal mood and affect. Well groomed.  Good eye contact.  Cardiovascular: overall no swelling, no varicosities, no edema bilaterally, normal temperatures of the legs and arms, no clubbing, cyanosis and good capillary refill.  Lymphatic: palpation is normal.  Neck has full motion and no pain.  Spine/Pelvis examination:  Inspection:  Overall, sacoiliac joint benign and hips nontender; without crepitus or defects.   Thoracic spine inspection: Alignment normal without kyphosis present   Lumbar spine inspection:  Alignment  with normal lumbar lordosis, without scoliosis apparent.   Thoracic spine palpation:  without tenderness of spinal processes   Lumbar spine palpation: with tenderness of lumbar area; without tightness of lumbar muscles    Range of Motion:   Lumbar flexion, forward flexion is 35 without pain or tenderness    Lumbar extension is 5 without pain or tenderness   Left lateral bend is Normal  without pain or tenderness   Right lateral bend is Normal without pain or tenderness   Straight leg raising is Normal   Strength & tone: Normal   Stability overall normal stability     The patient has been educated about the nature of the problem(s) and counseled on treatment options.  The patient appeared to understand what I have discussed and is in agreement with it.  Encounter Diagnoses  Name Primary?  . Midline low back pain without sciatica Yes  . Fibromyalgia   . Morbid obesity due to excess calories ([place])   . Gastroesophageal reflux disease, esophagitis presence not specified     PLAN Call if any problems.  Precautions discussed.  Continue current medications.    Return to clinic 3 months   Electronically Signed Sanjuana Kava, MD 8/3/20178:52 AM

## 2016-07-30 ENCOUNTER — Telehealth: Payer: Self-pay | Admitting: Orthopaedic Surgery

## 2016-07-30 MED ORDER — HYDROCODONE-ACETAMINOPHEN 7.5-325 MG PO TABS: 1.0000 | ORAL_TABLET | ORAL | 0 refills | Status: DC | PRN

## 2016-07-30 NOTE — Telephone Encounter (Signed)
Patient requests refill: HYDROcodone-acetaminophen (NORCO) 7.5-325 MG tablet, quantity 120. Insurance is [employer] primary, Medicaid 2ndary.

## 2016-08-27 ENCOUNTER — Other Ambulatory Visit: Payer: Self-pay | Admitting: *Deleted

## 2016-08-27 ENCOUNTER — Telehealth: Payer: Self-pay | Admitting: Orthopedic Surgery

## 2016-08-27 MED ORDER — HYDROCODONE-ACETAMINOPHEN 7.5-325 MG PO TABS: 1.0000 | ORAL_TABLET | ORAL | 0 refills | Status: DC | PRN

## 2016-08-27 NOTE — Telephone Encounter (Signed)
Dr. Brooke Bonito patient requests a refill on Hydrocodone/Acetaminophen (Norco)  7.5-325 mgs.   Qty 110  Sig: Take 1 tablet by mouth every 4 (four) hours as needed for moderate pain.

## 2016-08-27 NOTE — Telephone Encounter (Signed)
OK 

## 2016-09-26 ENCOUNTER — Ambulatory Visit: Payer: Medicare Other | Admitting: Orthopaedic Surgery

## 2016-10-01 ENCOUNTER — Ambulatory Visit (INDEPENDENT_AMBULATORY_CARE_PROVIDER_SITE_OTHER): Payer: Medicare Other

## 2016-10-01 ENCOUNTER — Encounter: Payer: Self-pay | Admitting: Orthopaedic Surgery

## 2016-10-01 ENCOUNTER — Ambulatory Visit (INDEPENDENT_AMBULATORY_CARE_PROVIDER_SITE_OTHER): Payer: Medicare Other | Admitting: Orthopaedic Surgery

## 2016-10-01 VITALS — BP 155/97 | HR 96 | Temp 97.5°F | Ht 67.5 in | Wt 279.0 lb

## 2016-10-01 DIAGNOSIS — M25551 Pain in right hip: Secondary | ICD-10-CM

## 2016-10-01 DIAGNOSIS — K219 Gastro-esophageal reflux disease without esophagitis: Secondary | ICD-10-CM

## 2016-10-01 DIAGNOSIS — G8929 Other chronic pain: Secondary | ICD-10-CM

## 2016-10-01 DIAGNOSIS — M5441 Lumbago with sciatica, right side: Secondary | ICD-10-CM

## 2016-10-01 MED ORDER — HYDROCODONE-ACETAMINOPHEN 7.5-325 MG PO TABS: 1.0000 | ORAL_TABLET | ORAL | 0 refills | Status: DC | PRN

## 2016-10-01 NOTE — Progress Notes (Signed)
Patient Kelli Wilson, female DOB:May 06, 1951, 65 y.o. [id number]  Chief Complaint  Patient presents with  . Follow-up    Back and hip pain    HPI  Kelli Wilson is a 65 y.o. female who has lower back pain with right sided paresthesias.  She has no new trauma. She is having more pain in the right hip.  She has been taking her medicine and doing her exercises.  She uses a cane. HPI  Body mass index is 43.05 kg/m.  ROS  Review of Systems  Constitutional:       She has diabetes diet controlled. She has COPD  HENT: Positive for congestion.   Respiratory: Positive for shortness of breath. Negative for cough.   Cardiovascular: Negative for chest pain and leg swelling.  Endocrine: Positive for cold intolerance.  Musculoskeletal: Positive for arthralgias, back pain and myalgias.  Allergic/Immunologic: Positive for environmental allergies and food allergies.    Past Medical History:  Diagnosis Date  . Anxiety   . Back pain   . 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 Chest pain   . Chronic pain   . Depression   . Fibromyalgia   . GERD (gastroesophageal reflux disease)   . Hypercholesteremia   . Hypertension   . Knee pain   . Obesity   . Peptic ulcer    history of - normal GI studies Sept 2010    Past Surgical History:  Procedure Laterality Date  . APPENDECTOMY    . bladder rectal repair    . CHOLECYSTECTOMY    . DILATION AND CURETTAGE OF UTERUS    . TOTAL ABDOMINAL HYSTERECTOMY      Family History  Problem Relation Age of Onset  . Aneurysm Father     Social History Social History  Substance Use Topics  . Smoking status: Never Smoker  . Smokeless tobacco: Never Used  . Alcohol use No    Allergies  Allergen Reactions  . Other Anaphylaxis    Lysol and other cleaning products  . Iohexol      Code: HIVES, Desc: PT STATES SHE WAS GIVEN IV DYE AT [facility] AND BROKE OUT IN HIVES, RASH, AND HAD TO BE HOSPITALIZED DUE TO  REACTION   . Ivp Dye [Iodinated Diagnostic Agents]   . Motrin [Ibuprofen]   . Nsaids Other (See Comments)  . Statins   . Sulfa Antibiotics     Current Outpatient Prescriptions  Medication Sig Dispense Refill  . albuterol (PROVENTIL) (2.5 MG/3ML) 0.083% nebulizer solution Take by nebulization every 6 (six) hours as needed.      Marland Kitchen aspirin 81 MG tablet Take 81 mg by mouth every other day.    . citalopram (CELEXA) 10 MG tablet Take 10 mg by mouth daily. Reported on 03/12/2016    . diltiazem (CARDIZEM CD) 240 MG 24 hr capsule Take 120 mg by mouth daily. Reported on 03/12/2016    . Fluticasone-Salmeterol (ADVAIR) 100-50 MCG/DOSE AEPB Inhale 1 puff into the lungs every 12 (twelve) hours.    . gabapentin (NEURONTIN) 300 MG capsule Take 300 mg by mouth 3 (three) times daily. Reported on 03/12/2016    . HYDROcodone-acetaminophen (NORCO) 7.5-325 MG tablet Take 1 tablet by mouth every 4 (four) hours as needed for moderate pain. 120 tablet 0  . hydrOXYzine (ATARAX/VISTARIL) 50 MG tablet Take 100 mg by mouth at bedtime.    . metFORMIN (GLUCOPHAGE) 500 MG tablet Take by mouth 2 (two) times daily  with a meal. Takes one in the morning and 1/2 at night    . nitroGLYCERIN (NITRODUR - DOSED IN MG/24 HR) 0.2 mg/hr Place 1 patch onto the skin as needed.    Marland Kitchen omeprazole (PRILOSEC) 40 MG capsule Take 40 mg by mouth daily.    Marland Kitchen oxybutynin (DITROPAN) 5 MG tablet Take 5 mg by mouth daily.    . pioglitazone (ACTOS) 15 MG tablet Take 15 mg by mouth daily.    . prochlorperazine (COMPAZINE) 10 MG tablet Take 10 mg by mouth every 6 (six) hours as needed.       No current facility-administered medications for this visit.      Physical Exam  Blood pressure (!) 155/97, pulse 96, temperature 97.5 F (36.4 C), height 5' 7.5" (1.715 m), weight 279 lb (126.6 kg).  Constitutional: overall normal hygiene, normal nutrition, well developed, normal grooming, normal body habitus. Assistive device:cane  Musculoskeletal: gait  and station Limp right, muscle tone and strength are normal, no tremors or atrophy is present.  .  Neurological: coordination overall normal.  Deep tendon reflex/nerve stretch intact.  Sensation normal.  Cranial nerves II-XII intact.   Skin:   Normal overall no scars, lesions, ulcers or rashes. No psoriasis.  Psychiatric: Alert and oriented x 3.  Recent memory intact, remote memory unclear.  Normal mood and affect. Well groomed.  Good eye contact.  Cardiovascular: overall no swelling, no varicosities, no edema bilaterally, normal temperatures of the legs and arms, no clubbing, cyanosis and good capillary refill.  Lymphatic: palpation is normal.  Spine/Pelvis examination:  Inspection:  Overall, sacoiliac joint benign and hips nontender; without crepitus or defects.   Thoracic spine inspection: Alignment normal without kyphosis present   Lumbar spine inspection:  Alignment  with normal lumbar lordosis, without scoliosis apparent.   Thoracic spine palpation:  without tenderness of spinal processes   Lumbar spine palpation: with tenderness of lumbar area; without tightness of lumbar muscles    Range of Motion:   Lumbar flexion, forward flexion is 35 with pain or tenderness    Lumbar extension is 5 with pain or tenderness   Left lateral bend is Normal  without pain or tenderness   Right lateral bend is Normal without pain or tenderness   Straight leg raising is Normal   Strength & tone: Normal   Stability overall normal stability   The right hip has diffuse tenderness but full motion.  NV intact.  She uses a cane.  The left hip has full ROM and no pain.  NV intact.  The patient has been educated about the nature of the problem(s) and counseled on treatment options.  The patient appeared to understand what I have discussed and is in agreement with it.  Encounter Diagnoses  Name Primary?  . Chronic right-sided low back pain with right-sided sciatica Yes  . Pain of right hip joint    . Morbid obesity due to excess calories ([place])   . Gastroesophageal reflux disease, esophagitis presence not specified    X-rays were done of the right hip and lumbar spine, reported separately.  PLAN Call if any problems.  Precautions discussed.  Continue current medications.   Return to clinic 3 months   Electronically [place], MD 11/7/20179:06 AM

## 2016-10-30 ENCOUNTER — Telehealth: Payer: Self-pay | Admitting: Orthopaedic Surgery

## 2016-10-30 MED ORDER — HYDROCODONE-ACETAMINOPHEN 7.5-325 MG PO TABS: 1.0000 | ORAL_TABLET | ORAL | 0 refills | Status: DC | PRN

## 2016-10-30 NOTE — Telephone Encounter (Signed)
Patient requests refill:  HYDROcodone-acetaminophen (NORCO) 7.5-325 MG tablet 120 tablet    - no email available for MyChart access.

## 2016-11-28 ENCOUNTER — Telehealth: Payer: Self-pay | Admitting: Orthopaedic Surgery

## 2016-11-28 MED ORDER — HYDROCODONE-ACETAMINOPHEN 7.5-325 MG PO TABS: 1.0000 | ORAL_TABLET | ORAL | 0 refills | Status: DC | PRN

## 2016-11-28 MED ORDER — HYDROCODONE-ACETAMINOPHEN 7.5-325 MG PO TABS: ORAL_TABLET | ORAL | 0 refills | Status: DC

## 2016-11-28 NOTE — Telephone Encounter (Signed)
Hydrocodone-Acetaminphen  7.5/325  Qty  120  Tablets

## 2016-11-28 NOTE — Telephone Encounter (Signed)
Patient requests refill on Hydrocodone/Acetaminophen (Norco) 7.5-325  Mgs.   Qty  120  Sig: Take 1 tablet by mouth every 4 (four) hours as needed for moderate pain.

## 2016-12-30 DIAGNOSIS — E1165 Type 2 diabetes mellitus with hyperglycemia: Secondary | ICD-10-CM | POA: Diagnosis not present

## 2016-12-30 DIAGNOSIS — I10 Essential (primary) hypertension: Secondary | ICD-10-CM | POA: Diagnosis not present

## 2016-12-30 DIAGNOSIS — E784 Other hyperlipidemia: Secondary | ICD-10-CM | POA: Diagnosis not present

## 2016-12-30 DIAGNOSIS — M545 Low back pain: Secondary | ICD-10-CM | POA: Diagnosis not present

## 2017-01-01 ENCOUNTER — Encounter: Payer: Self-pay | Admitting: Orthopaedic Surgery

## 2017-01-01 ENCOUNTER — Ambulatory Visit (INDEPENDENT_AMBULATORY_CARE_PROVIDER_SITE_OTHER): Payer: Medicare Other | Admitting: Orthopaedic Surgery

## 2017-01-01 VITALS — BP 151/93 | HR 86 | Temp 97.9°F | Ht 67.5 in | Wt 273.0 lb

## 2017-01-01 DIAGNOSIS — M797 Fibromyalgia: Secondary | ICD-10-CM | POA: Diagnosis not present

## 2017-01-01 DIAGNOSIS — G8929 Other chronic pain: Secondary | ICD-10-CM

## 2017-01-01 DIAGNOSIS — M25551 Pain in right hip: Secondary | ICD-10-CM

## 2017-01-01 DIAGNOSIS — M545 Low back pain, unspecified: Secondary | ICD-10-CM

## 2017-01-01 DIAGNOSIS — M5441 Lumbago with sciatica, right side: Secondary | ICD-10-CM | POA: Diagnosis not present

## 2017-01-01 DIAGNOSIS — K219 Gastro-esophageal reflux disease without esophagitis: Secondary | ICD-10-CM

## 2017-01-01 MED ORDER — HYDROCODONE-ACETAMINOPHEN 7.5-325 MG PO TABS: ORAL_TABLET | ORAL | 0 refills | Status: DC

## 2017-01-01 NOTE — Progress Notes (Signed)
Patient Kelli Wilson, female DOB:Dec 26, 1950, 66 y.o. [id number]  Chief Complaint  Patient presents with  . Follow-up    chronic back and right hip pain  . Finger Injury    Rt ring finger injury  . Knee Pain    bilateral knee pain    HPI  Kelli Wilson is a 66 y.o. female who has chronic pain in multiple joints and her back. She has no new acute injury.  She has a mallet finger of the right ring finger.  She has some right sided paresthesias at times.  She is taking her medicine.  She is active. HPI  Body mass index is 42.13 kg/m.  ROS  Review of Systems  Constitutional:       She has diabetes diet controlled. She has COPD  HENT: Positive for congestion.   Respiratory: Positive for shortness of breath. Negative for cough.   Cardiovascular: Negative for chest pain and leg swelling.  Endocrine: Positive for cold intolerance.  Musculoskeletal: Positive for arthralgias, back pain and myalgias.  Allergic/Immunologic: Positive for environmental allergies and food allergies.    Past Medical History:  Diagnosis Date  . Anxiety   . Back pain   . 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 Chest pain   . Chronic pain   . Depression   . Fibromyalgia   . GERD (gastroesophageal reflux disease)   . Hypercholesteremia   . Hypertension   . Knee pain   . Obesity   . Peptic ulcer    history of - normal GI studies Sept 2010    Past Surgical History:  Procedure Laterality Date  . APPENDECTOMY    . bladder rectal repair    . CHOLECYSTECTOMY    . DILATION AND CURETTAGE OF UTERUS    . TOTAL ABDOMINAL HYSTERECTOMY      Family History  Problem Relation Age of Onset  . Aneurysm Father     Social History Social History  Substance Use Topics  . Smoking status: Never Smoker  . Smokeless tobacco: Never Used  . Alcohol use No    Allergies  Allergen Reactions  . Other Anaphylaxis    Lysol and other cleaning products  . Iohexol      Code:  HIVES, Desc: PT STATES SHE WAS GIVEN IV DYE AT [facility] AND BROKE OUT IN HIVES, RASH, AND HAD TO BE HOSPITALIZED DUE TO REACTION   . Ivp Dye [Iodinated Diagnostic Agents]   . Motrin [Ibuprofen]   . Nsaids Other (See Comments)  . Statins   . Sulfa Antibiotics     Current Outpatient Prescriptions  Medication Sig Dispense Refill  . albuterol (PROVENTIL) (2.5 MG/3ML) 0.083% nebulizer solution Take by nebulization every 6 (six) hours as needed.      Marland Kitchen aspirin 81 MG tablet Take 81 mg by mouth every other day.    . citalopram (CELEXA) 10 MG tablet Take 10 mg by mouth daily. Reported on 03/12/2016    . diltiazem (CARDIZEM CD) 240 MG 24 hr capsule Take 120 mg by mouth daily. Reported on 03/12/2016    . Fluticasone-Salmeterol (ADVAIR) 100-50 MCG/DOSE AEPB Inhale 1 puff into the lungs every 12 (twelve) hours.    . gabapentin (NEURONTIN) 300 MG capsule Take 300 mg by mouth 3 (three) times daily. Reported on 03/12/2016    . HYDROcodone-acetaminophen (NORCO) 7.5-325 MG tablet One tablet every six hours as needed for pain.  Must last 30 days. 75 tablet 0  .  hydrOXYzine (ATARAX/VISTARIL) 50 MG tablet Take 100 mg by mouth at bedtime.    . metFORMIN (GLUCOPHAGE) 500 MG tablet Take by mouth 2 (two) times daily with a meal. Takes one in the morning and 1/2 at night    . nitroGLYCERIN (NITRODUR - DOSED IN MG/24 HR) 0.2 mg/hr Place 1 patch onto the skin as needed.    Marland Kitchen omeprazole (PRILOSEC) 40 MG capsule Take 40 mg by mouth daily.    Marland Kitchen oxybutynin (DITROPAN) 5 MG tablet Take 5 mg by mouth daily.    . pioglitazone (ACTOS) 15 MG tablet Take 15 mg by mouth daily.    . prochlorperazine (COMPAZINE) 10 MG tablet Take 10 mg by mouth every 6 (six) hours as needed.       No current facility-administered medications for this visit.      Physical Exam  Blood pressure (!) 151/93, pulse 86, temperature 97.9 F (36.6 C), height 5' 7.5" (1.715 m), weight 273 lb (123.8 kg).  Constitutional: overall normal hygiene, normal  nutrition, well developed, normal grooming, normal body habitus. Assistive device:none  Musculoskeletal: gait and station Limp none, muscle tone and strength are normal, no tremors or atrophy is present.  .  Neurological: coordination overall normal.  Deep tendon reflex/nerve stretch intact.  Sensation normal.  Cranial nerves II-XII intact.   Skin:   Normal overall no scars, lesions, ulcers or rashes. No psoriasis.  Psychiatric: Alert and oriented x 3.  Recent memory intact, remote memory unclear.  Normal mood and affect. Well groomed.  Good eye contact.  Cardiovascular: overall no swelling, no varicosities, no edema bilaterally, normal temperatures of the legs and arms, no clubbing, cyanosis and good capillary refill.  Lymphatic: palpation is normal.  Spine/Pelvis examination:  Inspection:  Overall, sacoiliac joint benign and hips nontender; without crepitus or defects.   Thoracic spine inspection: Alignment normal without kyphosis present   Lumbar spine inspection:  Alignment  with normal lumbar lordosis, without scoliosis apparent.   Thoracic spine palpation:  without tenderness of spinal processes   Lumbar spine palpation: with tenderness of lumbar area; without tightness of lumbar muscles    Range of Motion:   Lumbar flexion, forward flexion is 45 without pain or tenderness    Lumbar extension is 10 without pain or tenderness   Left lateral bend is Normal  without pain or tenderness   Right lateral bend is Normal without pain or tenderness   Straight leg raising is Normal   Strength & tone: Normal   Stability overall normal stability   She has a mallet finger of the right ring finger.  NV intact.  Right hip tender but ROM full.  She has no limp.  The patient has been educated about the nature of the problem(s) and counseled on treatment options.  The patient appeared to understand what I have discussed and is in agreement with it.  Encounter Diagnoses  Name Primary?   . Chronic right-sided low back pain with right-sided sciatica Yes  . Pain of right hip joint   . Fibromyalgia   . Chronic midline low back pain without sciatica   . Gastroesophageal reflux disease, esophagitis presence not specified     PLAN Call if any problems.  Precautions discussed.  Continue current medications.   Return to clinic 3 months   I have reviewed the [place] web site prior to prescribing narcotic medicine for this patient.  Electronically Signed Sanjuana Kava, MD 2/7/20189:12 AM

## 2017-01-05 DIAGNOSIS — K219 Gastro-esophageal reflux disease without esophagitis: Secondary | ICD-10-CM | POA: Diagnosis not present

## 2017-01-05 DIAGNOSIS — E119 Type 2 diabetes mellitus without complications: Secondary | ICD-10-CM | POA: Diagnosis not present

## 2017-01-05 DIAGNOSIS — M797 Fibromyalgia: Secondary | ICD-10-CM | POA: Diagnosis not present

## 2017-01-05 DIAGNOSIS — R05 Cough: Secondary | ICD-10-CM | POA: Diagnosis not present

## 2017-01-05 DIAGNOSIS — I10 Essential (primary) hypertension: Secondary | ICD-10-CM | POA: Diagnosis not present

## 2017-01-05 DIAGNOSIS — J449 Chronic obstructive pulmonary disease, unspecified: Secondary | ICD-10-CM | POA: Diagnosis not present

## 2017-01-05 DIAGNOSIS — R531 Weakness: Secondary | ICD-10-CM | POA: Diagnosis not present

## 2017-01-05 DIAGNOSIS — J690 Pneumonitis due to inhalation of food and vomit: Secondary | ICD-10-CM | POA: Diagnosis not present

## 2017-01-05 DIAGNOSIS — Z79899 Other long term (current) drug therapy: Secondary | ICD-10-CM | POA: Diagnosis not present

## 2017-01-05 DIAGNOSIS — T5991XA Toxic effect of unspecified gases, fumes and vapors, accidental (unintentional), initial encounter: Secondary | ICD-10-CM | POA: Diagnosis not present

## 2017-01-05 DIAGNOSIS — J680 Bronchitis and pneumonitis due to chemicals, gases, fumes and vapors: Secondary | ICD-10-CM | POA: Diagnosis not present

## 2017-01-05 DIAGNOSIS — R0602 Shortness of breath: Secondary | ICD-10-CM | POA: Diagnosis not present

## 2017-01-05 DIAGNOSIS — M810 Age-related osteoporosis without current pathological fracture: Secondary | ICD-10-CM | POA: Diagnosis not present

## 2017-01-05 DIAGNOSIS — Z7984 Long term (current) use of oral hypoglycemic drugs: Secondary | ICD-10-CM | POA: Diagnosis not present

## 2017-01-05 DIAGNOSIS — R404 Transient alteration of awareness: Secondary | ICD-10-CM | POA: Diagnosis not present

## 2017-01-06 DIAGNOSIS — J208 Acute bronchitis due to other specified organisms: Secondary | ICD-10-CM | POA: Diagnosis not present

## 2017-04-01 ENCOUNTER — Ambulatory Visit: Payer: Medicare Other | Admitting: Orthopaedic Surgery

## 2017-09-02 ENCOUNTER — Ambulatory Visit (INDEPENDENT_AMBULATORY_CARE_PROVIDER_SITE_OTHER): Payer: Medicare Other | Admitting: General Surgery

## 2017-09-02 ENCOUNTER — Encounter: Payer: Self-pay | Admitting: General Surgery

## 2017-09-02 VITALS — BP 186/102 | HR 105 | Temp 98.6°F | Resp 18 | Ht 68.0 in | Wt 274.0 lb

## 2017-09-02 DIAGNOSIS — C50412 Malignant neoplasm of upper-outer quadrant of left female breast: Secondary | ICD-10-CM

## 2017-09-02 NOTE — Patient Instructions (Signed)
Total or Modified Radical Mastectomy A total mastectomy and a modified radical mastectomy are types of surgery for breast cancer. If you are having a total mastectomy (simple mastectomy), your entire breast will be removed. If you are having a modified radical mastectomy, your breast and nipple will be removed along with the lymph nodes under your arm. You may also have some of the lining over the muscle tissues under your breast removed. Let your health care provider know about:  Any allergies you have.  All medicines you are taking, including vitamins, herbs, eye drops, creams, and over-the-counter medicines.  Previous problems you or members of your family have had with the use of anesthetics.  Any blood disorders you have.  Any surgeries you have had.  Any medical conditions you have. What are the risks? Generally, this is a safe procedure. However, problems may occur, including:  Pain.  Infection.  Bleeding.  Scar tissue.  Chest numbness on the side of the surgery.  Fluid buildup under the skin flaps where your breast was removed (seroma).  Sensation of throbbing or tingling.  Stress or sadness from losing your breast.  If you have the lymph nodes under your arm removed, you may have arm swelling, weakness, or numbness on the same side of your body as your surgery. What happens before the procedure?  Ask your health care provider about: ? Changing or stopping your regular medicines. This is especially important if you are taking diabetes medicines or blood thinners. ? Taking medicines such as aspirin and ibuprofen. These medicines can thin your blood. Do not take these medicines before your procedure if your health care provider instructs you not to.  Follow your health care provider's instructions about eating or drinking restrictions.  You may be checked for extra fluid around your lymph nodes (lymphedema).  Plan to have someone take you home after the  procedure. What happens during the procedure?  An IV tube will be inserted into one of your veins.  You will be given a medicine that makes you fall asleep (general anesthetic).  Your breast will be cleaned with a germ-killing solution (antiseptic).  A wide incision will be made around your nipple. The skin and nipple inside the incision will be removed along with all breast tissue.  If you are having a modified radical mastectomy: ? The lining over your chest muscles will be removed. ? The incision may be extended to reach the lymph nodes under your arm, or a second incision may be made. ? The lymph nodes will be removed.  You may have a drainage tube inserted into your incision to collect fluid that builds up after surgery. This tube is connected to a suction bulb.  Your incision or incisions will be closed with stitches (sutures).  A bandage (dressing) will be placed over your breast and under your arm. The procedure may vary among health care providers and hospitals. What happens after the procedure?  You will be moved to a recovery area.  Your blood pressure, heart rate, breathing rate, and blood oxygen level will be monitored often until the medicines you were given have worn off.  You will be given pain medicine as needed.  After a while, you will be taken to a hospital room.  You will be encouraged to get up and walk as soon as you can.  Your IV tube can be removed when you are able to eat and drink.  Your drain may be removed before you go home   from the hospital, or you may be sent home with your drain and suction bulb. This information is not intended to replace advice given to you by your health care provider. Make sure you discuss any questions you have with your health care provider. Document Released: 08/06/2001 Document Revised: 07/18/2016 Document Reviewed: 07/27/2014 Elsevier Interactive Patient Education  2018 [employer]  

## 2017-09-02 NOTE — Progress Notes (Signed)
Kelli Wilson; [id number]; 08-29-1951   HPI Patient is a 66 year old white female who was referred to my care by Dr. Lorriane Shire for evaluation treatment of a left breast cancer. This was found on screening mammography. Core biopsy reveals invasive carcinoma in the upper, outer quadrant. Patient denies any pain in the left breast. She was adopted. She denies any nipple discharge. She currently has 0/10 pain. Past Medical History:  Diagnosis Date  . Anxiety   . Back pain   . Byssinosis ([place])    secondary to working in a [employer]  . Chest pain   . Chronic pain   . Depression   . Fibromyalgia   . GERD (gastroesophageal reflux disease)   . Hypercholesteremia   . Hypertension   . Knee pain   . Obesity   . Peptic ulcer    history of - normal GI studies Sept 2010    Past Surgical History:  Procedure Laterality Date  . APPENDECTOMY    . bladder rectal repair    . CHOLECYSTECTOMY    . DILATION AND CURETTAGE OF UTERUS    . TOTAL ABDOMINAL HYSTERECTOMY      Family History  Problem Relation Age of Onset  . Aneurysm Father     Current Outpatient Prescriptions on File Prior to Visit  Medication Sig Dispense Refill  . albuterol (PROVENTIL) (2.5 MG/3ML) 0.083% nebulizer solution Take 2.5 mg by nebulization every 6 (six) hours as needed for wheezing or shortness of breath.     Marland Kitchen HYDROcodone-acetaminophen (NORCO) 7.5-325 MG tablet One tablet every six hours as needed for pain.  Must last 30 days. (Patient taking differently: Take 1 tablet by mouth every 6 (six) hours as needed for moderate pain. ) 75 tablet 0  . hydrOXYzine (ATARAX/VISTARIL) 50 MG tablet Take 50 mg by mouth at bedtime.     . metFORMIN (GLUCOPHAGE) 500 MG tablet Take 500 mg by mouth 2 (two) times daily with a meal.     . omeprazole (PRILOSEC) 40 MG capsule Take 40 mg by mouth at bedtime.      No current facility-administered medications on file prior to visit.     Allergies  Allergen Reactions  . Other Anaphylaxis  and Other (See Comments)    Lysol and other cleaning products  . Iohexol Other (See Comments)     Code: HIVES, Desc: PT STATES SHE WAS GIVEN IV DYE AT [facility] AND BROKE OUT IN HIVES, RASH, AND HAD TO BE HOSPITALIZED DUE TO REACTION   . Ivp Dye [Iodinated Diagnostic Agents] Other (See Comments)    Unknown  . Latex Itching  . Motrin [Ibuprofen] Other (See Comments)    Unknown  . Nsaids Other (See Comments)    Unknown  . Statins Other (See Comments)    Unknown  . Sulfa Antibiotics Other (See Comments)    Unknown  . Adhesive [Tape] Rash    History  Alcohol Use No    History  Smoking Status  . Never Smoker  Smokeless Tobacco  . Never Used    Review of Systems  Constitutional: Positive for malaise/fatigue.  HENT: Positive for sinus pain.   Eyes: Negative.   Respiratory: Positive for cough, shortness of breath and wheezing.   Cardiovascular: Negative.   Gastrointestinal: Positive for heartburn.  Genitourinary: Negative.   Musculoskeletal: Positive for back pain, joint pain and neck pain.  Skin: Negative.   Neurological: Negative.   Endo/Heme/Allergies: Negative.   Psychiatric/Behavioral: Negative.     Objective  Vitals:   09/02/17 1406  BP: (!) 186/102  Pulse: (!) 105  Resp: 18  Temp: 98.6 F (37 C)    Physical Exam  Constitutional: She is oriented to person, place, and time and well-developed, well-nourished, and in no distress.  HENT:  Head: Normocephalic and atraumatic.  Neck: Normal range of motion. Neck supple.  Cardiovascular: Normal rate and regular rhythm.  Exam reveals friction rub. Exam reveals no gallop.   No murmur heard. Pulmonary/Chest: Effort normal and breath sounds normal. No respiratory distress. She has no wheezes. She has no rales.  Poor inspiratory effort noted.  Lymphadenopathy:    She has no cervical adenopathy.  Neurological: She is alert and oriented to person, place, and time.  Skin: Skin is warm and dry.  Vitals  reviewed. Breast: No dominant mass, nipple discharge, dimpling noted. The left nipple is somewhat inverted. There is a surgical scar noted along the superior aspect of the left breast. Axillas are negative for palpable nodes. Pathology report reviewed.  Assessment  Left breast carcinoma Plan   I did discuss surgical options including left modified radical mastectomy versus left partial mastectomy with sentinel lymph node biopsy and postoperative radiation therapy. Patient would like to proceed with a left modified radical mastectomy. This has been scheduled for 09/15/2017. The risks and benefits of the procedure including bleeding, infection, nerve injury, and the possibility of left arm swelling were fully explained to the patient, who gave informed consent.

## 2017-09-02 NOTE — H&P (Signed)
Kelli Wilson; [id number]; 11/26/1950   HPI Patient is a 66 year old white female who was referred to my care by Dr. Lorriane Shire for evaluation treatment of a left breast cancer. This was found on screening mammography. Core biopsy reveals invasive carcinoma in the upper, outer quadrant. Patient denies any pain in the left breast. She was adopted. She denies any nipple discharge. She currently has 0/10 pain. Past Medical History:  Diagnosis Date  . Anxiety   . Back pain   . Byssinosis ([place])    secondary to working in a [employer]  . Chest pain   . Chronic pain   . Depression   . Fibromyalgia   . GERD (gastroesophageal reflux disease)   . Hypercholesteremia   . Hypertension   . Knee pain   . Obesity   . Peptic ulcer    history of - normal GI studies Sept 2010    Past Surgical History:  Procedure Laterality Date  . APPENDECTOMY    . bladder rectal repair    . CHOLECYSTECTOMY    . DILATION AND CURETTAGE OF UTERUS    . TOTAL ABDOMINAL HYSTERECTOMY      Family History  Problem Relation Age of Onset  . Aneurysm Father     Current Outpatient Prescriptions on File Prior to Visit  Medication Sig Dispense Refill  . albuterol (PROVENTIL) (2.5 MG/3ML) 0.083% nebulizer solution Take 2.5 mg by nebulization every 6 (six) hours as needed for wheezing or shortness of breath.     Marland Kitchen HYDROcodone-acetaminophen (NORCO) 7.5-325 MG tablet One tablet every six hours as needed for pain.  Must last 30 days. (Patient taking differently: Take 1 tablet by mouth every 6 (six) hours as needed for moderate pain. ) 75 tablet 0  . hydrOXYzine (ATARAX/VISTARIL) 50 MG tablet Take 50 mg by mouth at bedtime.     . metFORMIN (GLUCOPHAGE) 500 MG tablet Take 500 mg by mouth 2 (two) times daily with a meal.     . omeprazole (PRILOSEC) 40 MG capsule Take 40 mg by mouth at bedtime.      No current facility-administered medications on file prior to visit.     Allergies  Allergen Reactions  . Other Anaphylaxis  and Other (See Comments)    Lysol and other cleaning products  . Iohexol Other (See Comments)     Code: HIVES, Desc: PT STATES SHE WAS GIVEN IV DYE AT [facility] AND BROKE OUT IN HIVES, RASH, AND HAD TO BE HOSPITALIZED DUE TO REACTION   . Ivp Dye [Iodinated Diagnostic Agents] Other (See Comments)    Unknown  . Latex Itching  . Motrin [Ibuprofen] Other (See Comments)    Unknown  . Nsaids Other (See Comments)    Unknown  . Statins Other (See Comments)    Unknown  . Sulfa Antibiotics Other (See Comments)    Unknown  . Adhesive [Tape] Rash    History  Alcohol Use No    History  Smoking Status  . Never Smoker  Smokeless Tobacco  . Never Used    Review of Systems  Constitutional: Positive for malaise/fatigue.  HENT: Positive for sinus pain.   Eyes: Negative.   Respiratory: Positive for cough, shortness of breath and wheezing.   Cardiovascular: Negative.   Gastrointestinal: Positive for heartburn.  Genitourinary: Negative.   Musculoskeletal: Positive for back pain, joint pain and neck pain.  Skin: Negative.   Neurological: Negative.   Endo/Heme/Allergies: Negative.   Psychiatric/Behavioral: Negative.     Objective  Vitals:   09/02/17 1406  BP: (!) 186/102  Pulse: (!) 105  Resp: 18  Temp: 98.6 F (37 C)    Physical Exam  Constitutional: She is oriented to person, place, and time and well-developed, well-nourished, and in no distress.  HENT:  Head: Normocephalic and atraumatic.  Neck: Normal range of motion. Neck supple.  Cardiovascular: Normal rate and regular rhythm.  Exam reveals friction rub. Exam reveals no gallop.   No murmur heard. Pulmonary/Chest: Effort normal and breath sounds normal. No respiratory distress. She has no wheezes. She has no rales.  Poor inspiratory effort noted.  Lymphadenopathy:    She has no cervical adenopathy.  Neurological: She is alert and oriented to person, place, and time.  Skin: Skin is warm and dry.  Vitals  reviewed. Breast: No dominant mass, nipple discharge, dimpling noted. The left nipple is somewhat inverted. There is a surgical scar noted along the superior aspect of the left breast. Axillas are negative for palpable nodes. Pathology report reviewed.  Assessment  Left breast carcinoma Plan   I did discuss surgical options including left modified radical mastectomy versus left partial mastectomy with sentinel lymph node biopsy and postoperative radiation therapy. Patient would like to proceed with a left modified radical mastectomy. This has been scheduled for 09/15/2017. The risks and benefits of the procedure including bleeding, infection, nerve injury, and the possibility of left arm swelling were fully explained to the patient, who gave informed consent.

## 2017-09-09 NOTE — Patient Instructions (Signed)
Kelli Wilson  09/09/2017     @PREFPERIOPPHARMACY @   Your procedure is scheduled on 09/15/2017   Report to [facility] at  720  A.M.  Call this number if you have problems the morning of surgery:  [phone]   Remember:  Do not eat food or drink liquids after midnight.  Take these medicines the morning of surgery with A SIP OF WATER  Xanax, hydrocodone, prilosec, phenergan.   Do not wear jewelry, make-up or nail polish.  Do not wear lotions, powders, or perfumes, or deoderant.  Do not shave 48 hours prior to surgery.  Men may shave face and neck.  Do not bring valuables to the hospital.  [facility] is not responsible for any belongings or valuables.  Contacts, dentures or bridgework may not be worn into surgery.  Leave your suitcase in the car.  After surgery it may be brought to your room.  For patients admitted to the hospital, discharge time will be determined by your treatment team.  Patients discharged the day of surgery will not be allowed to drive home.   Name and phone number of your driver:   family Special instructions:  None  Please read over the following fact sheets that you were given. Pain Booklet, Coughing and Deep Breathing, Blood Transfusion Information, Lab Information, MRSA Information, Surgical Site Infection Prevention, Anesthesia Post-op Instructions and Care and Recovery After Surgery      Total or Modified Radical Mastectomy A total mastectomy and a modified radical mastectomy are types of surgery for breast cancer. If you are having a total mastectomy (simple mastectomy), your entire breast will be removed. If you are having a modified radical mastectomy, your breast and nipple will be removed along with the lymph nodes under your arm. You may also have some of the lining over the muscle tissues under your breast removed. Let your health care provider know about:  Any allergies you have.  All medicines you are taking,  including vitamins, herbs, eye drops, creams, and over-the-counter medicines.  Previous problems you or members of your family have had with the use of anesthetics.  Any blood disorders you have.  Any surgeries you have had.  Any medical conditions you have. What are the risks? Generally, this is a safe procedure. However, problems may occur, including:  Pain.  Infection.  Bleeding.  Scar tissue.  Chest numbness on the side of the surgery.  Fluid buildup under the skin flaps where your breast was removed (seroma).  Sensation of throbbing or tingling.  Stress or sadness from losing your breast.  If you have the lymph nodes under your arm removed, you may have arm swelling, weakness, or numbness on the same side of your body as your surgery. What happens before the procedure?  Ask your health care provider about: ? Changing or stopping your regular medicines. This is especially important if you are taking diabetes medicines or blood thinners. ? Taking medicines such as aspirin and ibuprofen. These medicines can thin your blood. Do not take these medicines before your procedure if your health care provider instructs you not to.  Follow your health care provider's instructions about eating or drinking restrictions.  You may be checked for extra fluid around your lymph nodes (lymphedema).  Plan to have someone take you home after the procedure. What happens during the procedure?  An IV tube will be inserted into one of your veins.  You will  be given a medicine that makes you fall asleep (general anesthetic).  Your breast will be cleaned with a germ-killing solution (antiseptic).  A wide incision will be made around your nipple. The skin and nipple inside the incision will be removed along with all breast tissue.  If you are having a modified radical mastectomy: ? The lining over your chest muscles will be removed. ? The incision may be extended to reach the lymph nodes  under your arm, or a second incision may be made. ? The lymph nodes will be removed.  You may have a drainage tube inserted into your incision to collect fluid that builds up after surgery. This tube is connected to a suction bulb.  Your incision or incisions will be closed with stitches (sutures).  A bandage (dressing) will be placed over your breast and under your arm. The procedure may vary among health care providers and hospitals. What happens after the procedure?  You will be moved to a recovery area.  Your blood pressure, heart rate, breathing rate, and blood oxygen level will be monitored often until the medicines you were given have worn off.  You will be given pain medicine as needed.  After a while, you will be taken to a hospital room.  You will be encouraged to get up and walk as soon as you can.  Your IV tube can be removed when you are able to eat and drink.  Your drain may be removed before you go home from the hospital, or you may be sent home with your drain and suction bulb. This information is not intended to replace advice given to you by your health care provider. Make sure you discuss any questions you have with your health care provider. Document Released: 08/06/2001 Document Revised: 07/18/2016 Document Reviewed: 07/27/2014 Elsevier Interactive Patient Education  2018 [place]. Total or Modified Radical Mastectomy, Care After Refer to this sheet in the next few weeks. These instructions provide you with information about caring for yourself after your procedure. Your health care provider may also give you more specific instructions. Your treatment has been planned according to current medical practices, but problems sometimes occur. Call your health care provider if you have any problems or questions after your procedure. What can I expect after the procedure? After your procedure, it is common to have:  Pain.  Numbness.  Stiffness in your arm or  shoulder.  Feelings of stress, sadness, or depression.  If the lymph nodes under your arm were removed, you may have arm swelling, weakness, or numbness on the same side of your body as your surgery. Follow these instructions at home: Incision care  There are many different ways to close and cover an incision, including stitches, skin glue, and adhesive strips. Follow your health care provider's instructions about: ? Incision care. ? Bandage (dressing) changes and removal. ? Incision closure removal.  Check your incision area every day for signs of infection. Watch for: ? Redness, swelling, or pain. ? Fluid, blood, or pus.  If you were sent home with a surgical drain in place, follow your health care provider's instructions for emptying it. Bathing  Do not take baths, swim, or use a hot tub until your health care provider approves.  Take sponge baths until your health care provider says that you can start showering or bathing. Activity  Return to your normal activities as directed by your health care provider.  Avoid strenuous exercise.  Be careful to avoid any activities that  could cause an injury to your arm on the side of your surgery.  Do not lift anything that is heavier than 10 lb (4.5 kg). Avoid lifting with the arm that is on the side of your surgery.  Do not carry heavy objects on your shoulder.  After your drain is removed, you should perform exercises to keep your arm from getting stiff and swollen. Talk with your health care provider about which exercises are safe for you. General instructions  Take medicines only as directed by your health care provider.  You may eat what you usually do.  Keep your arm elevated when at rest.  Do not wear tight jewelry on your arm, wrist, or fingers on the side of your surgery.  Get checked for extra fluid around your lymph nodes (lymphedema) as often as told by your health care provider.  If you had a modified radical  mastectomy, always let your health care providers know that lymph nodes under your arm were removed. This is important information to share before you are involved in certain procedures, such as giving blood or having your blood pressure taken. Contact a health care provider if:  You have a fever.  Your pain medicine is not working.  Your arm swelling, weakness, or numbness has not improved after a few weeks.  You have new swelling in your breast or arm.  You have redness, swelling, or pain in your incision area.  You have fluid, blood, or pus coming from your incision. Get help right away if:  You have very bad pain in your breast or arm.  You have chest pain.  You have difficulty breathing. This information is not intended to replace advice given to you by your health care provider. Make sure you discuss any questions you have with your health care provider. Document Released: 07/04/2004 Document Revised: 07/18/2016 Document Reviewed: 07/27/2014 Elsevier Interactive Patient Education  2018 [place] Anesthesia, Adult, Care After These instructions provide you with information about caring for yourself after your procedure. Your health care provider may also give you more specific instructions. Your treatment has been planned according to current medical practices, but problems sometimes occur. Call your health care provider if you have any problems or questions after your procedure. What can I expect after the procedure? After the procedure, it is common to have:  Vomiting.  A sore throat.  Mental slowness.  It is common to feel:  Nauseous.  Cold or shivery.  Sleepy.  Tired.  Sore or achy, even in parts of your body where you did not have surgery.  Follow these instructions at home: For at least 24 hours after the procedure:  Do not: ? Participate in activities where you could fall or become injured. ? Drive. ? Use heavy machinery. ? Drink  alcohol. ? Take sleeping pills or medicines that cause drowsiness. ? Make important decisions or sign legal documents. ? Take care of children on your own.  Rest. Eating and drinking  If you vomit, drink water, juice, or soup when you can drink without vomiting.  Drink enough fluid to keep your urine clear or pale yellow.  Make sure you have little or no nausea before eating solid foods.  Follow the diet recommended by your health care provider. General instructions  Have a responsible adult stay with you until you are awake and alert.  Return to your normal activities as told by your health care provider. Ask your health care provider what activities are safe  for you.  Take over-the-counter and prescription medicines only as told by your health care provider.  If you smoke, do not smoke without supervision.  Keep all follow-up visits as told by your health care provider. This is important. Contact a health care provider if:  You continue to have nausea or vomiting at home, and medicines are not helpful.  You cannot drink fluids or start eating again.  You cannot urinate after 8-12 hours.  You develop a skin rash.  You have fever.  You have increasing redness at the site of your procedure. Get help right away if:  You have difficulty breathing.  You have chest pain.  You have unexpected bleeding.  You feel that you are having a life-threatening or urgent problem. This information is not intended to replace advice given to you by your health care provider. Make sure you discuss any questions you have with your health care provider. Document Released: 02/17/2001 Document Revised: 04/15/2016 Document Reviewed: 10/26/2015 Elsevier Interactive Patient Education  2018 [place] Anesthesia, Adult, Care After These instructions provide you with information about caring for yourself after your procedure. Your health care provider may also give you more specific  instructions. Your treatment has been planned according to current medical practices, but problems sometimes occur. Call your health care provider if you have any problems or questions after your procedure. What can I expect after the procedure? After the procedure, it is common to have:  Vomiting.  A sore throat.  Mental slowness.  It is common to feel:  Nauseous.  Cold or shivery.  Sleepy.  Tired.  Sore or achy, even in parts of your body where you did not have surgery.  Follow these instructions at home: For at least 24 hours after the procedure:  Do not: ? Participate in activities where you could fall or become injured. ? Drive. ? Use heavy machinery. ? Drink alcohol. ? Take sleeping pills or medicines that cause drowsiness. ? Make important decisions or sign legal documents. ? Take care of children on your own.  Rest. Eating and drinking  If you vomit, drink water, juice, or soup when you can drink without vomiting.  Drink enough fluid to keep your urine clear or pale yellow.  Make sure you have little or no nausea before eating solid foods.  Follow the diet recommended by your health care provider. General instructions  Have a responsible adult stay with you until you are awake and alert.  Return to your normal activities as told by your health care provider. Ask your health care provider what activities are safe for you.  Take over-the-counter and prescription medicines only as told by your health care provider.  If you smoke, do not smoke without supervision.  Keep all follow-up visits as told by your health care provider. This is important. Contact a health care provider if:  You continue to have nausea or vomiting at home, and medicines are not helpful.  You cannot drink fluids or start eating again.  You cannot urinate after 8-12 hours.  You develop a skin rash.  You have fever.  You have increasing redness at the site of your  procedure. Get help right away if:  You have difficulty breathing.  You have chest pain.  You have unexpected bleeding.  You feel that you are having a life-threatening or urgent problem. This information is not intended to replace advice given to you by your health care provider. Make sure you discuss any questions you  have with your health care provider. Document Released: 02/17/2001 Document Revised: 04/15/2016 Document Reviewed: 10/26/2015 Elsevier Interactive Patient Education  [employer].

## 2017-09-10 ENCOUNTER — Ambulatory Visit (HOSPITAL_COMMUNITY)
Admission: RE | Admit: 2017-09-10 | Discharge: 2017-09-10 | Disposition: A | Discharge status: Home / Self Care | Payer: Medicare Other | Source: Ambulatory Visit | Attending: General Surgery | Admitting: General Surgery

## 2017-09-10 ENCOUNTER — Encounter (HOSPITAL_COMMUNITY)
Admission: RE | Admit: 2017-09-10 | Discharge: 2017-09-10 | Disposition: A | Discharge status: Home / Self Care | Payer: Medicare Other | Source: Ambulatory Visit | Attending: General Surgery | Admitting: General Surgery

## 2017-09-10 ENCOUNTER — Encounter (HOSPITAL_COMMUNITY): Payer: Self-pay

## 2017-09-10 DIAGNOSIS — Z79899 Other long term (current) drug therapy: Secondary | ICD-10-CM | POA: Insufficient documentation

## 2017-09-10 DIAGNOSIS — Z01810 Encounter for preprocedural cardiovascular examination: Secondary | ICD-10-CM | POA: Insufficient documentation

## 2017-09-10 DIAGNOSIS — Z01818 Encounter for other preprocedural examination: Secondary | ICD-10-CM | POA: Insufficient documentation

## 2017-09-10 DIAGNOSIS — Z7984 Long term (current) use of oral hypoglycemic drugs: Secondary | ICD-10-CM | POA: Insufficient documentation

## 2017-09-10 DIAGNOSIS — K219 Gastro-esophageal reflux disease without esophagitis: Secondary | ICD-10-CM | POA: Diagnosis not present

## 2017-09-10 DIAGNOSIS — C50912 Malignant neoplasm of unspecified site of left female breast: Secondary | ICD-10-CM | POA: Diagnosis not present

## 2017-09-10 DIAGNOSIS — I10 Essential (primary) hypertension: Secondary | ICD-10-CM | POA: Diagnosis not present

## 2017-09-10 DIAGNOSIS — F329 Major depressive disorder, single episode, unspecified: Secondary | ICD-10-CM | POA: Insufficient documentation

## 2017-09-10 DIAGNOSIS — F419 Anxiety disorder, unspecified: Secondary | ICD-10-CM | POA: Insufficient documentation

## 2017-09-10 HISTORY — DX: Unspecified osteoarthritis, unspecified site: M19.90

## 2017-09-10 HISTORY — DX: Type 2 diabetes mellitus without complications: E11.9

## 2017-09-10 LAB — COMPREHENSIVE METABOLIC PANEL
ALK PHOS: 89 U/L (ref 38–126)
ALT: 18 U/L (ref 14–54)
ANION GAP: 11 (ref 5–15)
AST: 22 U/L (ref 15–41)
Albumin: 3.8 g/dL (ref 3.5–5.0)
BILIRUBIN TOTAL: 0.3 mg/dL (ref 0.3–1.2)
BUN: 12 mg/dL (ref 6–20)
CALCIUM: 8.9 mg/dL (ref 8.9–10.3)
CO2: 24 mmol/L (ref 22–32)
CREATININE: 0.95 mg/dL (ref 0.44–1.00)
Chloride: 103 mmol/L (ref 101–111)
Glucose, Bld: 170 mg/dL — ABNORMAL HIGH (ref 65–99)
Potassium: 3.2 mmol/L — ABNORMAL LOW (ref 3.5–5.1)
SODIUM: 138 mmol/L (ref 135–145)
TOTAL PROTEIN: 7.2 g/dL (ref 6.5–8.1)

## 2017-09-10 LAB — HEMOGLOBIN A1C
HEMOGLOBIN A1C: 6.3 % — AB (ref 4.8–5.6)
Mean Plasma Glucose: 134.11 mg/dL

## 2017-09-10 LAB — CBC WITH DIFFERENTIAL/PLATELET
BASOS PCT: 0 %
Basophils Absolute: 0 10*3/uL (ref 0.0–0.1)
EOS ABS: 0.2 10*3/uL (ref 0.0–0.7)
EOS PCT: 2 %
HCT: 39.6 % (ref 36.0–46.0)
Hemoglobin: 13.3 g/dL (ref 12.0–15.0)
LYMPHS ABS: 4.6 10*3/uL — AB (ref 0.7–4.0)
Lymphocytes Relative: 44 %
MCH: 29 pg (ref 26.0–34.0)
MCHC: 33.6 g/dL (ref 30.0–36.0)
MCV: 86.3 fL (ref 78.0–100.0)
MONO ABS: 0.5 10*3/uL (ref 0.1–1.0)
MONOS PCT: 5 %
Neutro Abs: 5 10*3/uL (ref 1.7–7.7)
Neutrophils Relative %: 49 %
Platelets: 228 10*3/uL (ref 150–400)
RBC: 4.59 MIL/uL (ref 3.87–5.11)
RDW: 13.7 % (ref 11.5–15.5)
WBC: 10.3 10*3/uL (ref 4.0–10.5)

## 2017-09-11 ENCOUNTER — Other Ambulatory Visit: Payer: Self-pay | Admitting: General Surgery

## 2017-09-11 MED ORDER — POTASSIUM CHLORIDE ER 20 MEQ PO TBCR: 20.0000 meq | EXTENDED_RELEASE_TABLET | Freq: Two times a day (BID) | ORAL | 0 refills | Status: DC

## 2017-09-11 NOTE — Pre-Procedure Instructions (Signed)
HgbA1C routed to PCP. 

## 2017-09-15 ENCOUNTER — Encounter (HOSPITAL_COMMUNITY): Payer: Self-pay | Admitting: *Deleted

## 2017-09-15 ENCOUNTER — Ambulatory Visit (HOSPITAL_COMMUNITY): Payer: Medicare Other | Admitting: Anesthesiology

## 2017-09-15 ENCOUNTER — Encounter (HOSPITAL_COMMUNITY)
Admission: RE | Disposition: A | Discharge status: Home / Self Care | Payer: Self-pay | Source: Ambulatory Visit | Attending: General Surgery

## 2017-09-15 ENCOUNTER — Observation Stay (HOSPITAL_COMMUNITY)
Admission: RE | Admit: 2017-09-15 | Discharge: 2017-09-16 | Disposition: A | Discharge status: Home / Self Care | Payer: Medicare Other | Source: Ambulatory Visit | Attending: General Surgery | Admitting: General Surgery

## 2017-09-15 DIAGNOSIS — C50912 Malignant neoplasm of unspecified site of left female breast: Secondary | ICD-10-CM | POA: Diagnosis present

## 2017-09-15 DIAGNOSIS — E7800 Pure hypercholesterolemia, unspecified: Secondary | ICD-10-CM | POA: Insufficient documentation

## 2017-09-15 DIAGNOSIS — G8929 Other chronic pain: Secondary | ICD-10-CM | POA: Insufficient documentation

## 2017-09-15 DIAGNOSIS — Z888 Allergy status to other drugs, medicaments and biological substances status: Secondary | ICD-10-CM | POA: Insufficient documentation

## 2017-09-15 DIAGNOSIS — C50412 Malignant neoplasm of upper-outer quadrant of left female breast: Principal | ICD-10-CM | POA: Insufficient documentation

## 2017-09-15 DIAGNOSIS — Z79891 Long term (current) use of opiate analgesic: Secondary | ICD-10-CM | POA: Diagnosis not present

## 2017-09-15 DIAGNOSIS — I10 Essential (primary) hypertension: Secondary | ICD-10-CM | POA: Diagnosis not present

## 2017-09-15 DIAGNOSIS — Z79899 Other long term (current) drug therapy: Secondary | ICD-10-CM | POA: Insufficient documentation

## 2017-09-15 DIAGNOSIS — Z7984 Long term (current) use of oral hypoglycemic drugs: Secondary | ICD-10-CM | POA: Insufficient documentation

## 2017-09-15 DIAGNOSIS — M797 Fibromyalgia: Secondary | ICD-10-CM | POA: Insufficient documentation

## 2017-09-15 DIAGNOSIS — Z882 Allergy status to sulfonamides status: Secondary | ICD-10-CM | POA: Insufficient documentation

## 2017-09-15 DIAGNOSIS — J449 Chronic obstructive pulmonary disease, unspecified: Secondary | ICD-10-CM | POA: Diagnosis not present

## 2017-09-15 DIAGNOSIS — F329 Major depressive disorder, single episode, unspecified: Secondary | ICD-10-CM | POA: Insufficient documentation

## 2017-09-15 DIAGNOSIS — E669 Obesity, unspecified: Secondary | ICD-10-CM | POA: Diagnosis not present

## 2017-09-15 DIAGNOSIS — F419 Anxiety disorder, unspecified: Secondary | ICD-10-CM | POA: Diagnosis not present

## 2017-09-15 DIAGNOSIS — K219 Gastro-esophageal reflux disease without esophagitis: Secondary | ICD-10-CM | POA: Diagnosis not present

## 2017-09-15 HISTORY — PX: MASTECTOMY MODIFIED RADICAL: SHX5962

## 2017-09-15 LAB — GLUCOSE, CAPILLARY
GLUCOSE-CAPILLARY: 130 mg/dL — AB (ref 65–99)
GLUCOSE-CAPILLARY: 126 mg/dL — AB (ref 65–99)
Glucose-Capillary: 145 mg/dL — ABNORMAL HIGH (ref 65–99)

## 2017-09-15 SURGERY — MASTECTOMY, MODIFIED RADICAL
Anesthesia: General | Laterality: Left

## 2017-09-15 MED ORDER — ACETAMINOPHEN 325 MG PO TABS: 650.0000 mg | ORAL_TABLET | Freq: Four times a day (QID) | ORAL | Status: DC | PRN

## 2017-09-15 MED ORDER — NITROGLYCERIN 0.4 MG SL SUBL: 0.4000 mg | SUBLINGUAL_TABLET | SUBLINGUAL | Status: DC | PRN

## 2017-09-15 MED ORDER — ONDANSETRON HCL 4 MG/2ML IJ SOLN
INTRAMUSCULAR | Status: DC | PRN
  Administered 2017-09-15: 4 mg via INTRAVENOUS

## 2017-09-15 MED ORDER — ONDANSETRON HCL 4 MG/2ML IJ SOLN: 4.0000 mg | Freq: Four times a day (QID) | INTRAMUSCULAR | Status: DC | PRN

## 2017-09-15 MED ORDER — HYDROMORPHONE HCL 1 MG/ML IJ SOLN
INTRAMUSCULAR | Status: AC
  Filled 2017-09-15: qty 1

## 2017-09-15 MED ORDER — ONDANSETRON HCL 4 MG/2ML IJ SOLN
INTRAMUSCULAR | Status: AC
  Filled 2017-09-15: qty 2

## 2017-09-15 MED ORDER — LORAZEPAM 2 MG/ML IJ SOLN
1.0000 mg | INTRAMUSCULAR | Status: DC | PRN
  Administered 2017-09-15: 1 mg via INTRAVENOUS
  Filled 2017-09-15: qty 1

## 2017-09-15 MED ORDER — MIDAZOLAM HCL 2 MG/2ML IJ SOLN
1.0000 mg | Freq: Once | INTRAMUSCULAR | Status: AC | PRN
  Administered 2017-09-15: 2 mg via INTRAVENOUS

## 2017-09-15 MED ORDER — ONDANSETRON 4 MG PO TBDP
ORAL_TABLET | ORAL | Status: AC
  Filled 2017-09-15: qty 1

## 2017-09-15 MED ORDER — DIPHENHYDRAMINE HCL 12.5 MG/5ML PO ELIX: 12.5000 mg | ORAL_SOLUTION | Freq: Four times a day (QID) | ORAL | Status: DC | PRN

## 2017-09-15 MED ORDER — MIDAZOLAM HCL 2 MG/2ML IJ SOLN
INTRAMUSCULAR | Status: AC
  Filled 2017-09-15: qty 2

## 2017-09-15 MED ORDER — SODIUM CHLORIDE 0.9 % IJ SOLN
INTRAMUSCULAR | Status: AC
  Filled 2017-09-15: qty 10

## 2017-09-15 MED ORDER — EPHEDRINE SULFATE 50 MG/ML IJ SOLN
INTRAMUSCULAR | Status: AC
  Filled 2017-09-15: qty 1

## 2017-09-15 MED ORDER — ENOXAPARIN SODIUM 40 MG/0.4ML ~~LOC~~ SOLN
40.0000 mg | Freq: Once | SUBCUTANEOUS | Status: AC
  Administered 2017-09-15: 40 mg via SUBCUTANEOUS

## 2017-09-15 MED ORDER — HEMOSTATIC AGENTS (NO CHARGE) OPTIME
TOPICAL | Status: DC | PRN
  Administered 2017-09-15: 1 via TOPICAL

## 2017-09-15 MED ORDER — LACTATED RINGERS IV SOLN
INTRAVENOUS | Status: DC
  Administered 2017-09-15 (×2): via INTRAVENOUS

## 2017-09-15 MED ORDER — FENTANYL CITRATE (PF) 250 MCG/5ML IJ SOLN
INTRAMUSCULAR | Status: AC
  Filled 2017-09-15: qty 5

## 2017-09-15 MED ORDER — BUPIVACAINE LIPOSOME 1.3 % IJ SUSP
INTRAMUSCULAR | Status: DC | PRN
  Administered 2017-09-15: 20 mL

## 2017-09-15 MED ORDER — DIPHENHYDRAMINE HCL 50 MG/ML IJ SOLN: 12.5000 mg | Freq: Four times a day (QID) | INTRAMUSCULAR | Status: DC | PRN

## 2017-09-15 MED ORDER — ONDANSETRON 4 MG PO TBDP
4.0000 mg | ORAL_TABLET | Freq: Four times a day (QID) | ORAL | Status: DC | PRN
Start: 2017-09-15 — End: 2017-09-16

## 2017-09-15 MED ORDER — HYDROMORPHONE HCL 1 MG/ML IJ SOLN
1.0000 mg | INTRAMUSCULAR | Status: DC | PRN
  Administered 2017-09-15 – 2017-09-16 (×6): 1 mg via INTRAVENOUS
  Filled 2017-09-15 (×6): qty 1

## 2017-09-15 MED ORDER — LACTATED RINGERS IV SOLN
INTRAVENOUS | Status: DC
  Administered 2017-09-15 (×2): via INTRAVENOUS

## 2017-09-15 MED ORDER — CHLORHEXIDINE GLUCONATE CLOTH 2 % EX PADS: 6.0000 | MEDICATED_PAD | Freq: Once | CUTANEOUS | Status: DC

## 2017-09-15 MED ORDER — METFORMIN HCL 500 MG PO TABS
500.0000 mg | ORAL_TABLET | Freq: Two times a day (BID) | ORAL | Status: DC
  Administered 2017-09-15 – 2017-09-16 (×2): 500 mg via ORAL
  Filled 2017-09-15 (×2): qty 1

## 2017-09-15 MED ORDER — HYDROMORPHONE HCL 1 MG/ML IJ SOLN
0.5000 mg | INTRAMUSCULAR | Status: DC | PRN
Start: 2017-09-15 — End: 2017-09-15
  Administered 2017-09-15 (×2): 0.5 mg via INTRAVENOUS

## 2017-09-15 MED ORDER — LIDOCAINE HCL (PF) 1 % IJ SOLN
INTRAMUSCULAR | Status: AC
  Filled 2017-09-15: qty 5

## 2017-09-15 MED ORDER — PANTOPRAZOLE SODIUM 40 MG PO TBEC
40.0000 mg | DELAYED_RELEASE_TABLET | Freq: Every day | ORAL | Status: DC
  Administered 2017-09-15: 40 mg via ORAL
  Filled 2017-09-15: qty 1

## 2017-09-15 MED ORDER — LIDOCAINE HCL (CARDIAC) 20 MG/ML IV SOLN
INTRAVENOUS | Status: DC | PRN
  Administered 2017-09-15: 40 mg via INTRAVENOUS

## 2017-09-15 MED ORDER — POVIDONE-IODINE 10 % OINT PACKET
TOPICAL_OINTMENT | CUTANEOUS | Status: DC | PRN
  Administered 2017-09-15: 1 via TOPICAL

## 2017-09-15 MED ORDER — PROPOFOL 10 MG/ML IV BOLUS
INTRAVENOUS | Status: AC
  Filled 2017-09-15: qty 20

## 2017-09-15 MED ORDER — ACETAMINOPHEN 650 MG RE SUPP: 650.0000 mg | Freq: Four times a day (QID) | RECTAL | Status: DC | PRN

## 2017-09-15 MED ORDER — ALBUTEROL SULFATE (2.5 MG/3ML) 0.083% IN NEBU: 2.5000 mg | INHALATION_SOLUTION | Freq: Four times a day (QID) | RESPIRATORY_TRACT | Status: DC | PRN

## 2017-09-15 MED ORDER — CEFAZOLIN SODIUM-DEXTROSE 2-4 GM/100ML-% IV SOLN
INTRAVENOUS | Status: AC
  Filled 2017-09-15: qty 100

## 2017-09-15 MED ORDER — ENOXAPARIN SODIUM 40 MG/0.4ML ~~LOC~~ SOLN
SUBCUTANEOUS | Status: AC
  Filled 2017-09-15: qty 0.4

## 2017-09-15 MED ORDER — PROPOFOL 10 MG/ML IV BOLUS
INTRAVENOUS | Status: DC | PRN
  Administered 2017-09-15: 130 mg via INTRAVENOUS
  Administered 2017-09-15: 20 mg via INTRAVENOUS

## 2017-09-15 MED ORDER — CEFAZOLIN SODIUM-DEXTROSE 2-4 GM/100ML-% IV SOLN
2.0000 g | INTRAVENOUS | Status: AC
  Administered 2017-09-15: 2 g via INTRAVENOUS

## 2017-09-15 MED ORDER — HYDROCODONE-ACETAMINOPHEN 5-325 MG PO TABS
1.0000 | ORAL_TABLET | ORAL | Status: DC | PRN
  Administered 2017-09-16: 2 via ORAL
  Filled 2017-09-15: qty 2

## 2017-09-15 MED ORDER — PRAVASTATIN SODIUM 40 MG PO TABS: 40.0000 mg | ORAL_TABLET | Freq: Every day | ORAL | Status: DC

## 2017-09-15 MED ORDER — POVIDONE-IODINE 10 % EX OINT
TOPICAL_OINTMENT | CUTANEOUS | Status: AC
  Filled 2017-09-15: qty 1

## 2017-09-15 MED ORDER — ENOXAPARIN SODIUM 40 MG/0.4ML ~~LOC~~ SOLN
40.0000 mg | SUBCUTANEOUS | Status: DC
  Administered 2017-09-16: 40 mg via SUBCUTANEOUS
  Filled 2017-09-15: qty 0.4

## 2017-09-15 MED ORDER — FENTANYL CITRATE (PF) 100 MCG/2ML IJ SOLN
INTRAMUSCULAR | Status: DC | PRN
  Administered 2017-09-15: 50 ug via INTRAVENOUS
  Administered 2017-09-15: 25 ug via INTRAVENOUS
  Administered 2017-09-15: 50 ug via INTRAVENOUS
  Administered 2017-09-15 (×5): 25 ug via INTRAVENOUS

## 2017-09-15 MED ORDER — ONDANSETRON 4 MG PO TBDP
4.0000 mg | ORAL_TABLET | Freq: Once | ORAL | Status: AC
  Administered 2017-09-15: 4 mg via ORAL

## 2017-09-15 MED ORDER — BUPIVACAINE LIPOSOME 1.3 % IJ SUSP
INTRAMUSCULAR | Status: AC
  Filled 2017-09-15: qty 20

## 2017-09-15 MED ORDER — 0.9 % SODIUM CHLORIDE (POUR BTL) OPTIME
TOPICAL | Status: DC | PRN
  Administered 2017-09-15: 1000 mL

## 2017-09-15 SURGICAL SUPPLY — 40 items
APPLIER CLIP 11 MED OPEN (CLIP) ×4
APR CLP MED 11 20 MLT OPN (CLIP) ×2
BAG HAMPER (MISCELLANEOUS) ×2 IMPLANT
BINDER BREAST XLRG (GAUZE/BANDAGES/DRESSINGS) ×1 IMPLANT
CHLORAPREP W/TINT 26ML (MISCELLANEOUS) ×2 IMPLANT
CLIP APPLIE 11 MED OPEN (CLIP) IMPLANT
CLOTH BEACON ORANGE TIMEOUT ST (SAFETY) ×2 IMPLANT
COVER LIGHT HANDLE STERIS (MISCELLANEOUS) ×4 IMPLANT
DRAPE PROXIMA HALF (DRAPES) ×1 IMPLANT
ELECT REM PT RETURN 9FT ADLT (ELECTROSURGICAL) ×2
ELECTRODE REM PT RTRN 9FT ADLT (ELECTROSURGICAL) ×1 IMPLANT
EVACUATOR DRAINAGE 10X20 100CC (DRAIN) ×1 IMPLANT
EVACUATOR SILICONE 100CC (DRAIN) ×2
GAUZE SPONGE 4X4 12PLY STRL (GAUZE/BANDAGES/DRESSINGS) ×2 IMPLANT
GLOVE BIOGEL PI IND STRL 7.0 (GLOVE) ×1 IMPLANT
GLOVE BIOGEL PI INDICATOR 7.0 (GLOVE) ×1
GLOVE SURG SS PI 7.0 STRL IVOR (GLOVE) ×2 IMPLANT
GLOVE SURG SS PI 7.5 STRL IVOR (GLOVE) ×2 IMPLANT
GOWN STRL REUS W/TWL LRG LVL3 (GOWN DISPOSABLE) ×6 IMPLANT
HEMOSTAT ARISTA ABSORB 3G PWDR (MISCELLANEOUS) ×2 IMPLANT
INST SET MINOR GENERAL (KITS) ×2 IMPLANT
KIT ROOM TURNOVER APOR (KITS) ×2 IMPLANT
MANIFOLD NEPTUNE II (INSTRUMENTS) ×2 IMPLANT
NEEDLE HYPO 22GX1.5 SAFETY (NEEDLE) ×2 IMPLANT
NS IRRIG 1000ML POUR BTL (IV SOLUTION) ×2 IMPLANT
PACK MINOR (CUSTOM PROCEDURE TRAY) ×2 IMPLANT
PAD ABD 5X9 TENDERSORB (GAUZE/BANDAGES/DRESSINGS) ×2 IMPLANT
PAD ARMBOARD 7.5X6 YLW CONV (MISCELLANEOUS) ×2 IMPLANT
SET BASIN LINEN APH (SET/KITS/TRAYS/PACK) ×2 IMPLANT
SPONGE DRAIN TRACH 4X4 STRL 2S (GAUZE/BANDAGES/DRESSINGS) ×2 IMPLANT
SPONGE INTESTINAL PEANUT (DISPOSABLE) ×1 IMPLANT
SPONGE LAP 18X18 X RAY DECT (DISPOSABLE) ×4 IMPLANT
STAPLER VISISTAT (STAPLE) ×2 IMPLANT
SUT ETHILON 3 0 FSL (SUTURE) ×2 IMPLANT
SUT SILK 2 0 (SUTURE) ×2
SUT SILK 2 0 SH (SUTURE) ×2 IMPLANT
SUT SILK 2-0 18XBRD TIE 12 (SUTURE) ×1 IMPLANT
SUT VIC AB 2-0 CT1 27 (SUTURE) ×8
SUT VIC AB 2-0 CT1 TAPERPNT 27 (SUTURE) ×3 IMPLANT
SYR 20CC LL (SYRINGE) ×2 IMPLANT

## 2017-09-15 NOTE — Anesthesia Postprocedure Evaluation (Signed)
Anesthesia Post Note  Patient: Kelli Wilson  Procedure(s) Performed: LEFT MODIFIED RADICAL MASTECTOMY (Left )  Patient location during evaluation: PACU Anesthesia Type: General Level of consciousness: awake and alert, oriented and patient cooperative Pain management: pain level controlled Vital Signs Assessment: post-procedure vital signs reviewed and stable Respiratory status: spontaneous breathing and respiratory function stable Cardiovascular status: stable : nausea relieved after zofran. Anesthetic complications: no     Last Vitals:  Vitals:   09/15/17 1107 09/15/17 1115  BP:  123/72  Pulse: 73 73  Resp: 17 15  Temp:    SpO2: 100% 99%    Last Pain:  Vitals:   09/15/17 1107  TempSrc:   PainSc: 6                  ADAMS, AMY A

## 2017-09-15 NOTE — Anesthesia Procedure Notes (Signed)
Procedure Name: LMA Insertion Date/Time: 09/15/2017 8:53 AM Performed by: Andree Elk, Gennifer Potenza A Pre-anesthesia Checklist: Patient identified, Timeout performed, Emergency Drugs available, Suction available and Patient being monitored Patient Re-evaluated:Patient Re-evaluated prior to induction Oxygen Delivery Method: Circle system utilized Preoxygenation: Pre-oxygenation with 100% oxygen Induction Type: IV induction Ventilation: Mask ventilation without difficulty LMA: LMA inserted LMA Size: 4.0 Number of attempts: 1 Placement Confirmation: positive ETCO2 and breath sounds checked- equal and bilateral Tube secured with: Tape Dental Injury: Teeth and Oropharynx as per pre-operative assessment

## 2017-09-15 NOTE — Anesthesia Preprocedure Evaluation (Addendum)
Anesthesia Evaluation  Patient identified by MRN, date of birth, ID band Patient awake    History of Anesthesia Complications (+) AWARENESS UNDER ANESTHESIA  Airway Mallampati: I       Dental  (+) Teeth Intact, Poor Dentition   Pulmonary asthma (stable) , COPD,  COPD inhaler,    Pulmonary exam normal        Cardiovascular hypertension, Pt. on medications Normal cardiovascular exam Rhythm:Regular Rate:Normal  EF 55 - 60%   Neuro/Psych Anxiety Depression    GI/Hepatic GERD  Controlled,  Endo/Other  diabetes, Well Controlled  Renal/GU      Musculoskeletal   Abdominal (+)  Abdomen: soft.    Peds  Hematology   Anesthesia Other Findings   Reproductive/Obstetrics                            Anesthesia Physical Anesthesia Plan  ASA: IV  Anesthesia Plan: General   Post-op Pain Management:    Induction: Intravenous  PONV Risk Score and Plan:   Airway Management Planned: LMA  Additional Equipment:   Intra-op Plan:   Post-operative Plan: Extubation in OR  Informed Consent: I have reviewed the patients History and Physical, chart, labs and discussed the procedure including the risks, benefits and alternatives for the proposed anesthesia with the patient or authorized representative who has indicated his/her understanding and acceptance.   Dental advisory given  Plan Discussed with: CRNA  Anesthesia Plan Comments:         Anesthesia Quick Evaluation

## 2017-09-15 NOTE — Transfer of Care (Signed)
Immediate Anesthesia Transfer of Care Note  Patient: Kelli Wilson  Procedure(s) Performed: LEFT MODIFIED RADICAL MASTECTOMY (Left )  Patient Location: PACU  Anesthesia Type:General  Level of Consciousness: awake, alert , oriented and patient cooperative  Airway & Oxygen Therapy: Patient Spontanous Breathing and Patient connected to face mask oxygen  Post-op Assessment: Report given to RN and Post -op Vital signs reviewed and stable  Post vital signs: Reviewed and stable  Last Vitals:  Vitals:   09/15/17 0759 09/15/17 1018  BP: (!) 171/85   Pulse: 84 (P) 82  Resp: (!) 22 (P) 18  Temp: 36.8 C (P) 36.6 C  SpO2: 97% (P) 97%    Last Pain:  Vitals:   09/15/17 0759  TempSrc: Oral         Complications: No apparent anesthesia complications

## 2017-09-15 NOTE — Op Note (Signed)
Patient:  Kelli Wilson  DOB:  [birth year]  MRN:  [id number]   Preop Diagnosis:  Left breast cancer  Postop Diagnosis:  same  Procedure:  Left modified radical mastectomy  Surgeon:  Aviva Signs, M.D.  Anes:  General endotracheal  Indications:  Patient is a 66 year old white female who presents for a left modified radical mastectomy due to left breast carcinoma. The risks and benefits of the procedure including bleeding, infection, nerve injury, and the possibility of left arm swelling were fully explained to the patient, who gave informed consent.  Procedure note:  The patient was placed in the supine position. After induction of general endotracheal anesthesia, the left breast and axilla were prepped and draped using the usual sterile technique with ChloraPrep. Surgical site confirmation was performed.  An elliptical incision was made medial to lateral around the left nipple. A superior flap was formed to the clavicle and an inferior flap formed to the chest wall. The breast was then removed from the chest wall medial o lateral using Bovie electrocautery. A suture was placed  Superiorly for orientation purposes. A level II axillary dissection was performed. Care was taken to avoid the thoracic and dorsal artery vein and nerve. The long thoracic nerve and the brachiocephalic nerve were all spared. Several palpable lymph nodes were noted. The left breast and axillary contents were then sent to pathology for examination. The wound was irrigated with normal saline. A #10 flat Jackson-Pratt drain was placed along the flap and brought out through a separate stab wound inferior to the incision line. This was secured in place using a 3-0 nylon interrupted suture. Arista was placed into the left axillary bed. The subcutaneous layer was reapproximated using a 2-0 Vicryl interrupted suture. Exparel was instilled into the surrounding wound. The skin was closed using staples. Betadine ointment and dry  sterile dressings were applied.  All tape and needle counts were correct at the end of the procedure. The patient was extubated in the operating room and transferred to PACU in stable condition.  Complications:  none  EBL:  75 mL  Specimen:  Left breast and axillary contents  Drains: Jackson-Pratt drain to flap and left axilla

## 2017-09-15 NOTE — Progress Notes (Signed)
Pt became very irritable and agitated upon assessment and assistance to the bathroom. Pain med and anxiety medication given. Pt very argumentative and requesting nurses and techs to leave her alone. I contacted PT's daughter to explain the situation and set her at ease if PT did contact her. Daughter understands and thankful for call and clarification. Pt asleep at this time. Continue to monitor.

## 2017-09-15 NOTE — Interval H&P Note (Signed)
History and Physical Interval Note:  09/15/2017 8:22 AM  Kelli Wilson  has presented today for surgery, with the diagnosis of left breast cancer  The various methods of treatment have been discussed with the patient and family. After consideration of risks, benefits and other options for treatment, the patient has consented to  Procedure(s): MASTECTOMY MODIFIED RADICAL (Left) as a surgical intervention .  The patient's history has been reviewed, patient examined, no change in status, stable for surgery.  I have reviewed the patient's chart and labs.  Questions were answered to the patient's satisfaction.     Aviva Signs

## 2017-09-16 ENCOUNTER — Encounter (HOSPITAL_COMMUNITY): Payer: Self-pay | Admitting: General Surgery

## 2017-09-16 ENCOUNTER — Telehealth (HOSPITAL_COMMUNITY): Payer: Self-pay | Admitting: Family Medicine

## 2017-09-16 DIAGNOSIS — C50412 Malignant neoplasm of upper-outer quadrant of left female breast: Secondary | ICD-10-CM | POA: Diagnosis not present

## 2017-09-16 LAB — CBC
HEMATOCRIT: 35 % — AB (ref 36.0–46.0)
HEMOGLOBIN: 11.7 g/dL — AB (ref 12.0–15.0)
MCH: 29.6 pg (ref 26.0–34.0)
MCHC: 33.4 g/dL (ref 30.0–36.0)
MCV: 88.6 fL (ref 78.0–100.0)
Platelets: 201 10*3/uL (ref 150–400)
RBC: 3.95 MIL/uL (ref 3.87–5.11)
RDW: 14 % (ref 11.5–15.5)
WBC: 10.7 10*3/uL — ABNORMAL HIGH (ref 4.0–10.5)

## 2017-09-16 LAB — BASIC METABOLIC PANEL
ANION GAP: 9 (ref 5–15)
BUN: 14 mg/dL (ref 6–20)
CO2: 26 mmol/L (ref 22–32)
Calcium: 8.1 mg/dL — ABNORMAL LOW (ref 8.9–10.3)
Chloride: 99 mmol/L — ABNORMAL LOW (ref 101–111)
Creatinine, Ser: 1.28 mg/dL — ABNORMAL HIGH (ref 0.44–1.00)
GFR calc non Af Amer: 42 mL/min — ABNORMAL LOW (ref >60)
GFR, EST AFRICAN AMERICAN: 49 mL/min — AB (ref >60)
Glucose, Bld: 139 mg/dL — ABNORMAL HIGH (ref 65–99)
POTASSIUM: 4.2 mmol/L (ref 3.5–5.1)
Sodium: 134 mmol/L — ABNORMAL LOW (ref 135–145)

## 2017-09-16 LAB — GLUCOSE, CAPILLARY: Glucose-Capillary: 155 mg/dL — ABNORMAL HIGH (ref 65–99)

## 2017-09-16 MED ORDER — OXYCODONE-ACETAMINOPHEN 7.5-325 MG PO TABS: 1.0000 | ORAL_TABLET | ORAL | 0 refills | Status: DC | PRN

## 2017-09-16 NOTE — Care Management Note (Addendum)
Case Management Note  Patient Details  Name: Kelli Wilson MRN: [id number] Date of Birth: [birth year]  Subjective/Objective:                 S/p radical mastectomy. Chart reviewed for CM needs. Pt from home, ind, has family support, insurance with drug coverage, PCP and transportation to appointments. No HH/DME/CM consult ordered at DC.     Action/Plan: DC home today with self care. No CM needs noted at DC.   Expected Discharge Date:  09/16/17               Expected Discharge Plan:  Home/Self Care  In-House Referral:  NA  Discharge planning Services  CM Consult  Post Acute Care Choice:  NA Choice offered to:  NA  Status of Service:  Completed, signed off    Addendum: PT eval completed prior to DC and PT recommends OP PT. Pt has discharged before recommendation could be discussed, referral faxed to [facility], pt may refuse services if not interested.    Sherald Barge, RN 09/16/2017, 10:18 AM

## 2017-09-16 NOTE — Evaluation (Signed)
Physical Therapy Evaluation Patient Details Name: Kelli Wilson MRN: [id number] DOB: 1951-11-23 Today's Date: 09/16/2017   History of Present Illness  Kelli Wilson is a 66 y/o female s/p Left modified radical mastectomy   Clinical Impression  Patient is functioning at baseline other than limited use of LUE due to left sided chest wall pain.  Patient to be discharged today and discharged from physical therapy to care of nursing for ambulation ad lib for length of stay.    Follow Up Recommendations Outpatient PT (for ROM and strengthening LUE)    Equipment Recommendations  None recommended by PT    Recommendations for Other Services       Precautions / Restrictions Precautions Precautions: None Restrictions Weight Bearing Restrictions: No      Mobility  Bed Mobility Overal bed mobility: Independent                Transfers Overall transfer level: Independent                  Ambulation/Gait Ambulation/Gait assistance: Independent Ambulation Distance (Feet): 300 Feet Assistive device: None Gait Pattern/deviations: WFL(Within Functional Limits)   Gait velocity interpretation: at or above normal speed for age/gender    Stairs            Wheelchair Mobility    Modified Rankin (Stroke Patients Only)       Balance Overall balance assessment: No apparent balance deficits (not formally assessed)                                           Pertinent Vitals/Pain Pain Assessment: 0-10 Pain Score: 4  Pain Location: left side of chest wall Pain Descriptors / Indicators: Aching;Pressure Pain Intervention(s): Limited activity within patient's tolerance;Monitored during session    Home Living Family/patient expects to be discharged to:: Private residence Living Arrangements: Children (her daughter will be staying with her after ther surgery ) Available Help at Discharge: Family Type of Home: House Home Access: Ramped  entrance     Home Layout: One level Home Equipment: Environmental consultant - 2 wheels;Cane - single point;Bedside commode;Hospital bed      Prior Function Level of Independence: Independent               Hand Dominance        Extremity/Trunk Assessment   Upper Extremity Assessment Upper Extremity Assessment: Overall WFL for tasks assessed;LUE deficits/detail LUE Deficits / Details: grossly 3/5, limited to approx 100 degrees of shoulder flexion due to increased chest wall pain with end range movement    Lower Extremity Assessment Lower Extremity Assessment: Overall WFL for tasks assessed    Cervical / Trunk Assessment Cervical / Trunk Assessment: Normal  Communication   Communication: No difficulties  Cognition Arousal/Alertness: Awake/alert Behavior During Therapy: WFL for tasks assessed/performed Overall Cognitive Status: Within Functional Limits for tasks assessed                                        General Comments      Exercises     Assessment/Plan    PT Assessment All further PT needs can be met in the next venue of care  PT Problem List Decreased strength;Decreased range of motion       PT Treatment Interventions  PT Goals (Current goals can be found in the Care Plan section)  Acute Rehab PT Goals Patient Stated Goal: return home today with her daughter to assist PT Goal Formulation: With patient Time For Goal Achievement: 09/16/17 Potential to Achieve Goals: Good    Frequency     Barriers to discharge        Co-evaluation               AM-PAC PT "6 Clicks" Daily Activity  Outcome Measure Difficulty turning over in bed (including adjusting bedclothes, sheets and blankets)?: None Difficulty moving from lying on back to sitting on the side of the bed? : None Difficulty sitting down on and standing up from a chair with arms (e.g., wheelchair, bedside commode, etc,.)?: None Help needed moving to and from a bed to chair  (including a wheelchair)?: None Help needed walking in hospital room?: None Help needed climbing 3-5 steps with a railing? : None 6 Click Score: 24    End of Session   Activity Tolerance: Patient tolerated treatment well Patient left: in bed;with call bell/phone within reach (seated at bedside) Nurse Communication: Mobility status PT Visit Diagnosis: Unsteadiness on feet (R26.81);Other abnormalities of gait and mobility (R26.89);Muscle weakness (generalized) (M62.81)    Time: 9038-3338 PT Time Calculation (min) (ACUTE ONLY): 26 min   Charges:   PT Evaluation $PT Eval Low Complexity: 1 Low PT Treatments $Therapeutic Activity: 23-37 mins   PT G Codes:   PT G-Codes **NOT FOR INPATIENT CLASS** Functional Assessment Tool Used: AM-PAC 6 Clicks Basic Mobility Functional Limitation: Mobility: Walking and moving around Mobility: Walking and Moving Around Current Status (V2919): 0 percent impaired, limited or restricted Mobility: Walking and Moving Around Goal Status (T6606): 0 percent impaired, limited or restricted Mobility: Walking and Moving Around Discharge Status [id number]): 0 percent impaired, limited or restricted    11:42 AM, 09/16/17 Lonell Grandchild, MPT Physical Therapist with [facility] [phone] office [phone] mobile phone

## 2017-09-16 NOTE — Discharge Instructions (Signed)
Total or Modified Radical Mastectomy, Care After °Refer to this sheet in the next few weeks. These instructions provide you with information about caring for yourself after your procedure. Your health care provider may also give you more specific instructions. Your treatment has been planned according to current medical practices, but problems sometimes occur. Call your health care provider if you have any problems or questions after your procedure. °What can I expect after the procedure? °After your procedure, it is common to have: °· Pain. °· Numbness. °· Stiffness in your arm or shoulder. °· Feelings of stress, sadness, or depression. ° °If the lymph nodes under your arm were removed, you may have arm swelling, weakness, or numbness on the same side of your body as your surgery. °Follow these instructions at home: °Incision care °· There are many different ways to close and cover an incision, including stitches, skin glue, and adhesive strips. Follow your health care provider's instructions about: °? Incision care. °? Bandage (dressing) changes and removal. °? Incision closure removal. °· Check your incision area every day for signs of infection. Watch for: °? Redness, swelling, or pain. °? Fluid, blood, or pus. °· If you were sent home with a surgical drain in place, follow your health care provider's instructions for emptying it. °Bathing °· Do not take baths, swim, or use a hot tub until your health care provider approves. °· Take sponge baths until your health care provider says that you can start showering or bathing. °Activity °· Return to your normal activities as directed by your health care provider. °· Avoid strenuous exercise. °· Be careful to avoid any activities that could cause an injury to your arm on the side of your surgery. °· Do not lift anything that is heavier than 10 lb (4.5 kg). Avoid lifting with the arm that is on the side of your surgery. °· Do not carry heavy objects on your  shoulder. °· After your drain is removed, you should perform exercises to keep your arm from getting stiff and swollen. Talk with your health care provider about which exercises are safe for you. °General instructions °· Take medicines only as directed by your health care provider. °· You may eat what you usually do. °· Keep your arm elevated when at rest. °· Do not wear tight jewelry on your arm, wrist, or fingers on the side of your surgery. °· Get checked for extra fluid around your lymph nodes (lymphedema) as often as told by your health care provider. °· If you had a modified radical mastectomy, always let your health care providers know that lymph nodes under your arm were removed. This is important information to share before you are involved in certain procedures, such as giving blood or having your blood pressure taken. °Contact a health care provider if: °· You have a fever. °· Your pain medicine is not working. °· Your arm swelling, weakness, or numbness has not improved after a few weeks. °· You have new swelling in your breast or arm. °· You have redness, swelling, or pain in your incision area. °· You have fluid, blood, or pus coming from your incision. °Get help right away if: °· You have very bad pain in your breast or arm. °· You have chest pain. °· You have difficulty breathing. °This information is not intended to replace advice given to you by your health care provider. Make sure you discuss any questions you have with your health care provider. °Document Released: 07/04/2004 Document Revised: 07/18/2016   Document Reviewed: 07/27/2014 °Elsevier Interactive Patient Education © 2018 [employer] °Surgical Drain Home Care °Surgical drains are used to remove extra fluid that normally builds up in a surgical wound after surgery. A surgical drain helps to heal a surgical wound. Different kinds of surgical drains include: °· Active drains. These drains use suction to pull drainage away from the surgical  wound. Drainage flows through a tube to a container outside of the body. It is important to keep the bulb or the drainage container flat (compressed) at all times, except while you empty it. Flattening the bulb or container creates suction. The two most common types of active drains are bulb drains and Hemovac drains. °· Passive drains. These drains allow fluid to drain naturally, by gravity. Drainage flows through a tube to a bandage (dressing) or a container outside of the body. Passive drains do not need to be emptied. The most common type of passive drain is the Penrose drain. ° °A drain is placed during surgery. Immediately after surgery, drainage is usually bright red and a little thicker than water. The drainage may gradually turn yellow or pink and become thinner. It is likely that your health care provider will remove the drain when the drainage stops or when the amount decreases to 1-2 Tbsp (15-30 mL) during a 24-hour period. °How to care for your surgical drain °· Keep the skin around the drain dry and covered with a dressing at all times. °· Check your drain area every day for signs of infection. Check for: °? More redness, swelling, or pain. °? Pus or a bad smell. °? Cloudy drainage. °Follow instructions from your health care provider about how to take care of your drain and how to change your dressing. Change your dressing at least one time every day. Change it more often if needed to keep the dressing dry. Make sure you: °1. Gather your supplies, including: °? Tape. °? Germ-free cleaning solution (sterile saline). °? Split gauze drain sponge: 4 x 4 inches (10 x 10 cm). °? Gauze square: 4 x 4 inches (10 x 10 cm). °2. Wash your hands with soap and water before you change your dressing. If soap and water are not available, use hand sanitizer. °3. Remove the old dressing. Avoid using scissors to do that. °4. Use sterile saline to clean your skin around the drain. °5. Place the tube through the slit in a  drain sponge. Place the drain sponge so that it covers your wound. °6. Place the gauze square or another drain sponge on top of the drain sponge that is on the wound. Make sure the tube is between those layers. °7. Tape the dressing to your skin. °8. If you have an active bulb or Hemovac drain, tape the drainage tube to your skin 1-2 inches (2.5-5 cm) below the place where the tube enters your body. Taping keeps the tube from pulling on any stitches (sutures) that you have. °9. Wash your hands with soap and water. °10. Write down the color of your drainage and how often you change your dressing. ° °How to empty your active bulb or Hemovac drain °1. Make sure that you have a measuring cup that you can empty your drainage into. °2. Wash your hands with soap and water. If soap and water are not available, use hand sanitizer. °3. Gently move your fingers down the tube while squeezing very lightly. This is called stripping the tube. This clears any drainage, clots, or tissue from the tube. °?   Do not pull on the tube. °? You may need to strip the tube several times every day to keep the tube clear. °4. Open the bulb cap or the drain plug. Do not touch the inside of the cap or the bottom of the plug. °5. Empty all of the drainage into the measuring cup. °6. Compress the bulb or the container and replace the cap or the plug. To compress the bulb or the container, squeeze it firmly in the middle while you close the cap or plug the container. °7. Write down the amount of drainage that you have in each 24-hour period. If you have less than 2 Tbsp (30 mL) of drainage during 24 hours, contact your health care provider. °8. Flush the drainage down the toilet. °9. Wash your hands with soap and water. °Contact a health care provider if: °· You have more redness, swelling, or pain around your drain area. °· The amount of drainage that you have is increasing instead of decreasing. °· You have pus or a bad smell coming from your drain  area. °· You have a fever. °· You have drainage that is cloudy. °· There is a sudden stop or a sudden decrease in the amount of drainage that you have. °· Your tube falls out. °· Your active drain does not stay compressed after you empty it. °This information is not intended to replace advice given to you by your health care provider. Make sure you discuss any questions you have with your health care provider. °Document Released: 11/08/2000 Document Revised: 04/18/2016 Document Reviewed: 05/31/2015 °Elsevier Interactive Patient Education © 2018 [employer] ° °

## 2017-09-16 NOTE — Discharge Summary (Signed)
Physician Discharge Summary  Patient ID: DELL HURTUBISE MRN: [id number] DOB/AGE: 1951-03-05 66 y.o.  Admit date: 09/15/2017 Discharge date: 09/16/2017  Admission Diagnoses:left breast cancer  Discharge Diagnoses: same Active Problems:   Malignant neoplasm of upper-outer quadrant of left female breast [facility])   Breast cancer, left breast [facility])   Discharged Condition: good  Hospital Course: patient is a 66 year old white female with left breast cancer who underwent a left modified radical mastectomy on 09/15/2017. She tolerated the procedure well. Her postoperative course has been unremarkable. Her diet was advanced without difficulty. The patient is being discharged home on 09/16/2017 in good and improving condition.  Treatments: surgery:  left modified radical mastectomy on 09/15/2017  Discharge Exam: Blood pressure (!) 155/72, pulse 94, temperature 98.3 F (36.8 C), temperature source Oral, resp. rate 18, height 5' 7.5" (1.715 m), weight 275 lb (124.7 kg), SpO2 95 %. General appearance: alert, cooperative and no distress Resp: clear to auscultation bilaterally Breasts: left mastectomy incision healing well. Serosanguineous drainage noted in JP drain Cardio: regular rate and rhythm, S1, S2 normal, no murmur, click, rub or gallop  Disposition:   Discharge Instructions    Diet - low sodium heart healthy    Complete by:  As directed    Increase activity slowly    Complete by:  As directed      Allergies as of 09/16/2017      Reactions   Other Anaphylaxis, Other (See Comments)   Lysol and other cleaning products   Iohexol Other (See Comments)    Code: HIVES, Desc: PT STATES SHE WAS GIVEN IV DYE AT [place] BROKE OUT IN HIVES, RASH, AND HAD TO BE HOSPITALIZED DUE TO REACTION   Ivp Dye [iodinated Diagnostic Agents] Other (See Comments)   Unknown   Latex Itching   Motrin [ibuprofen] Other (See Comments)   Unknown   Nsaids Other (See Comments)   Unknown   Statins Other  (See Comments)   Unknown   Sulfa Antibiotics Other (See Comments)   Unknown   Adhesive [tape] Rash      Medication List    STOP taking these medications   HYDROcodone-acetaminophen 7.5-325 MG tablet Commonly known as:  NORCO     TAKE these medications   albuterol (2.5 MG/3ML) 0.083% nebulizer solution Commonly known as:  PROVENTIL Take 2.5 mg by nebulization every 6 (six) hours as needed for wheezing or shortness of breath.   ALPRAZolam 1 MG tablet Commonly known as:  XANAX Take 1 mg by mouth 2 (two) times daily as needed for anxiety.   hydrOXYzine 50 MG tablet Commonly known as:  ATARAX/VISTARIL Take 50 mg by mouth at bedtime.   Lidocaine 0.5 % Gel Apply 1 application topically as needed (for pain).   metFORMIN 500 MG tablet Commonly known as:  GLUCOPHAGE Take 500 mg by mouth 2 (two) times daily with a meal.   nitroGLYCERIN 0.4 MG SL tablet Commonly known as:  NITROSTAT Place 0.4 mg under the tongue every 5 (five) minutes as needed for chest pain.   NYQUIL PO Take 1 capsule by mouth as needed (for congestion).   omeprazole 40 MG capsule Commonly known as:  PRILOSEC Take 40 mg by mouth at bedtime.   oxyCODONE-acetaminophen 7.5-325 MG tablet Commonly known as:  PERCOCET Take 1 tablet by mouth every 4 (four) hours as needed.   Potassium Chloride ER 20 MEQ Tbcr Take 20 mEq by mouth 2 (two) times daily.   pravastatin 40 MG tablet Commonly known as:  PRAVACHOL Take 40 mg by mouth daily.   promethazine 25 MG tablet Commonly known as:  PHENERGAN Take 25 mg by mouth every 6 (six) hours as needed for nausea or vomiting.   Vitamin D3 5000 units Caps Take 5,000 Units by mouth daily.      Follow-up Information    Aviva Signs, MD. Schedule an appointment as soon as possible for a visit on 09/23/2017.   Specialty:  General Surgery Contact information: [id number] [place] [id number] [phone]           Signed: Aviva Signs 09/16/2017, 9:05 AM

## 2017-09-16 NOTE — Care Management Obs Status (Signed)
[place] NOTIFICATION   Patient Details  Name: Kelli Wilson MRN: [id number] Date of Birth: [birth year]   Medicare Observation Status Notification Given:  Other (see comment) (Pt discharged <24 hrs)    Sherald Barge, RN 09/16/2017, 10:17 AM

## 2017-09-17 LAB — TYPE AND SCREEN
ABO/RH(D): O POS
ANTIBODY SCREEN: NEGATIVE

## 2017-09-23 ENCOUNTER — Ambulatory Visit (INDEPENDENT_AMBULATORY_CARE_PROVIDER_SITE_OTHER): Payer: Self-pay | Admitting: General Surgery

## 2017-09-23 ENCOUNTER — Encounter: Payer: Self-pay | Admitting: General Surgery

## 2017-09-23 VITALS — BP 173/82 | HR 110 | Temp 98.7°F | Resp 20 | Ht 68.0 in | Wt 274.0 lb

## 2017-09-23 DIAGNOSIS — Z09 Encounter for follow-up examination after completed treatment for conditions other than malignant neoplasm: Secondary | ICD-10-CM

## 2017-09-23 NOTE — Progress Notes (Signed)
Subjective:     Kelli Wilson  Status post left modified radical mastectomy.  Doing well.  Still has serosanguineous drainage from her JP drain.  She denies any fevers.  Minimal incisional pain noted. Objective:    BP (!) 173/82   Pulse (!) 110   Temp 98.7 F (37.1 C)   Resp 20   Ht 5\' 8"  (1.727 m)   Wt 274 lb (124.3 kg)   BMI 41.66 kg/m   General:  alert, cooperative and no distress  Left breast incision well-healed.  No seroma present.  JP drainage 1/3 full.  Serosanguineous drainage noted.  One half the staples removed. Final pathology reveals a 1.8 cm invasive ductal carcinoma with high-grade ductal carcinoma in situ present.  Margins negative.  0 out of 8 lymph nodes involved.  Patient aware pathology results.     Assessment:    Doing well postoperatively.    Plan:   Follow-up in 1 week for wound check.  Will refer to oncology at that time.

## 2017-09-30 ENCOUNTER — Encounter: Payer: Self-pay | Admitting: General Surgery

## 2017-09-30 ENCOUNTER — Ambulatory Visit (INDEPENDENT_AMBULATORY_CARE_PROVIDER_SITE_OTHER): Payer: Medicare Other | Admitting: General Surgery

## 2017-09-30 VITALS — BP 177/102 | HR 95 | Temp 99.3°F | Resp 20 | Ht 68.0 in | Wt 278.0 lb

## 2017-09-30 DIAGNOSIS — Z09 Encounter for follow-up examination after completed treatment for conditions other than malignant neoplasm: Secondary | ICD-10-CM

## 2017-09-30 NOTE — Progress Notes (Signed)
Subjective:     Kelli Wilson  Doing well.  JP drainage significantly decreased.  Yellow in color. Objective:    BP (!) 177/102   Pulse 95   Temp 99.3 F (37.4 C)   Resp 20   Ht 5\' 8"  (1.727 m)   Wt 278 lb (126.1 kg)   BMI 42.27 kg/m   General:  alert, cooperative and no distress  Left mastectomy site healing well.  Remaining staples removed.  JP drain removed.     Assessment:    Doing well postoperatively.    Plan:   Will refer to oncology for further evaluation and treatment.  Follow-up here as needed.

## 2017-10-09 ENCOUNTER — Encounter (HOSPITAL_COMMUNITY): Payer: Self-pay | Admitting: Oncology

## 2017-10-09 ENCOUNTER — Other Ambulatory Visit: Payer: Self-pay

## 2017-10-09 ENCOUNTER — Other Ambulatory Visit (HOSPITAL_COMMUNITY): Payer: Self-pay | Admitting: Oncology

## 2017-10-09 ENCOUNTER — Encounter (HOSPITAL_COMMUNITY): Payer: Medicare Other

## 2017-10-09 ENCOUNTER — Encounter (HOSPITAL_COMMUNITY): Payer: Medicare Other | Attending: Oncology | Admitting: Oncology

## 2017-10-09 ENCOUNTER — Encounter (HOSPITAL_COMMUNITY): Payer: Self-pay | Admitting: Emergency Medicine

## 2017-10-09 VITALS — BP 134/82 | HR 90 | Temp 97.8°F | Resp 16 | Ht 67.5 in | Wt 272.5 lb

## 2017-10-09 DIAGNOSIS — C50412 Malignant neoplasm of upper-outer quadrant of left female breast: Secondary | ICD-10-CM | POA: Insufficient documentation

## 2017-10-09 DIAGNOSIS — Z171 Estrogen receptor negative status [ER-]: Principal | ICD-10-CM

## 2017-10-09 DIAGNOSIS — Z9012 Acquired absence of left breast and nipple: Secondary | ICD-10-CM | POA: Diagnosis not present

## 2017-10-09 LAB — COMPREHENSIVE METABOLIC PANEL
ALBUMIN: 4 g/dL (ref 3.5–5.0)
ALT: 16 U/L (ref 14–54)
ANION GAP: 9 (ref 5–15)
AST: 21 U/L (ref 15–41)
Alkaline Phosphatase: 88 U/L (ref 38–126)
BILIRUBIN TOTAL: 0.6 mg/dL (ref 0.3–1.2)
BUN: 11 mg/dL (ref 6–20)
CHLORIDE: 100 mmol/L — AB (ref 101–111)
CO2: 27 mmol/L (ref 22–32)
Calcium: 9.5 mg/dL (ref 8.9–10.3)
Creatinine, Ser: 1.1 mg/dL — ABNORMAL HIGH (ref 0.44–1.00)
GFR calc Af Amer: 58 mL/min — ABNORMAL LOW (ref >60)
GFR, EST NON AFRICAN AMERICAN: 50 mL/min — AB (ref >60)
GLUCOSE: 160 mg/dL — AB (ref 65–99)
POTASSIUM: 4.4 mmol/L (ref 3.5–5.1)
Sodium: 136 mmol/L (ref 135–145)
TOTAL PROTEIN: 7.4 g/dL (ref 6.5–8.1)

## 2017-10-09 LAB — CBC WITH DIFFERENTIAL/PLATELET
Basophils Absolute: 0.1 10*3/uL (ref 0.0–0.1)
Basophils Relative: 1 %
EOS ABS: 0.3 10*3/uL (ref 0.0–0.7)
EOS PCT: 3 %
HEMATOCRIT: 40.7 % (ref 36.0–46.0)
Hemoglobin: 13.7 g/dL (ref 12.0–15.0)
Lymphocytes Relative: 45 %
Lymphs Abs: 4.4 10*3/uL — ABNORMAL HIGH (ref 0.7–4.0)
MCH: 29.7 pg (ref 26.0–34.0)
MCHC: 33.7 g/dL (ref 30.0–36.0)
MCV: 88.3 fL (ref 78.0–100.0)
Monocytes Absolute: 0.7 10*3/uL (ref 0.1–1.0)
Monocytes Relative: 8 %
Neutro Abs: 4.2 10*3/uL (ref 1.7–7.7)
Neutrophils Relative %: 43 %
PLATELETS: 262 10*3/uL (ref 150–400)
RBC: 4.61 MIL/uL (ref 3.87–5.11)
RDW: 14.2 % (ref 11.5–15.5)
WBC: 9.8 10*3/uL (ref 4.0–10.5)

## 2017-10-09 MED ORDER — ONDANSETRON HCL 8 MG PO TABS: 8.0000 mg | ORAL_TABLET | Freq: Two times a day (BID) | ORAL | 1 refills | Status: DC | PRN

## 2017-10-09 MED ORDER — PROCHLORPERAZINE MALEATE 10 MG PO TABS: 10.0000 mg | ORAL_TABLET | Freq: Four times a day (QID) | ORAL | 1 refills | Status: DC | PRN

## 2017-10-09 MED ORDER — DEXAMETHASONE 4 MG PO TABS: ORAL_TABLET | ORAL | 1 refills | Status: DC

## 2017-10-09 MED ORDER — LIDOCAINE-PRILOCAINE 2.5-2.5 % EX CREA: TOPICAL_CREAM | CUTANEOUS | 3 refills | Status: DC

## 2017-10-09 NOTE — H&P (Signed)
Kelli Wilson is an 66 y.o. female.   Chief Complaint: Left breast cancer HPI: Patient is a 66 year old female who recently underwent left modified radical mastectomy for breast cancer.  She is now presenting for Port-A-Cath insertion as she is about to undergo chemotherapy.  Past Medical History:  Diagnosis Date  . Anxiety   . Arthritis   . Back pain   . Byssinosis ([place])    secondary to working in a [employer]  . Chest pain   . Chronic pain   . Depression   . Diabetes mellitus without complication ([place])   . Fibromyalgia   . GERD (gastroesophageal reflux disease)   . Hypercholesteremia   . Hypertension   . Knee pain   . Obesity   . Peptic ulcer    history of - normal GI studies Sept 2010    Past Surgical History:  Procedure Laterality Date  . APPENDECTOMY    . bladder rectal repair    . BREAST SURGERY Left    breast biopsy- benign  . CHOLECYSTECTOMY    . DILATION AND CURETTAGE OF UTERUS    . GANGLION CYST EXCISION Left   . MASTECTOMY MODIFIED RADICAL Left 09/15/2017   Procedure: LEFT MODIFIED RADICAL MASTECTOMY;  Surgeon: Aviva Signs, MD;  Location: [facility];  Service: General;  Laterality: Left;  . TOTAL ABDOMINAL HYSTERECTOMY      Family History  Problem Relation Age of Onset  . Aneurysm Father    Social History:  reports that  has never smoked. Her smokeless tobacco use includes snuff. She reports that she does not drink alcohol or use drugs.  Allergies:  Allergies  Allergen Reactions  . Other Anaphylaxis and Other (See Comments)    Lysol and other cleaning products  . Iohexol Other (See Comments)     Code: HIVES, Desc: PT STATES SHE WAS GIVEN IV DYE AT [facility] AND BROKE OUT IN HIVES, RASH, AND HAD TO BE HOSPITALIZED DUE TO REACTION   . Ivp Dye [Iodinated Diagnostic Agents] Other (See Comments)    Unknown  . Januvia [Sitagliptin]     Stomach pains   . Latex Itching  . Motrin [Ibuprofen] Other (See Comments)    Unknown  . Nsaids Other (See Comments)     Unknown  . Statins Other (See Comments)    Unknown  . Sulfa Antibiotics Other (See Comments)    Unknown  . Adhesive [Tape] Rash    No medications prior to admission.    Results for orders placed or performed in visit on 10/09/17 (from the past 48 hour(s))  CBC with Differential     Status: Abnormal   Collection Time: 10/09/17 10:56 AM  Result Value Ref Range   WBC 9.8 4.0 - 10.5 K/uL   RBC 4.61 3.87 - 5.11 MIL/uL   Hemoglobin 13.7 12.0 - 15.0 g/dL   HCT 40.7 36.0 - 46.0 %   MCV 88.3 78.0 - 100.0 fL   MCH 29.7 26.0 - 34.0 pg   MCHC 33.7 30.0 - 36.0 g/dL   RDW 14.2 11.5 - 15.5 %   Platelets 262 150 - 400 K/uL   Neutrophils Relative % 43 %   Neutro Abs 4.2 1.7 - 7.7 K/uL   Lymphocytes Relative 45 %   Lymphs Abs 4.4 (H) 0.7 - 4.0 K/uL   Monocytes Relative 8 %   Monocytes Absolute 0.7 0.1 - 1.0 K/uL   Eosinophils Relative 3 %   Eosinophils Absolute 0.3 0.0 - 0.7 K/uL  Basophils Relative 1 %   Basophils Absolute 0.1 0.0 - 0.1 K/uL  Comprehensive metabolic panel     Status: Abnormal   Collection Time: 10/09/17 10:56 AM  Result Value Ref Range   Sodium 136 135 - 145 mmol/L   Potassium 4.4 3.5 - 5.1 mmol/L   Chloride 100 (L) 101 - 111 mmol/L   CO2 27 22 - 32 mmol/L   Glucose, Bld 160 (H) 65 - 99 mg/dL   BUN 11 6 - 20 mg/dL   Creatinine, Ser 1.10 (H) 0.44 - 1.00 mg/dL   Calcium 9.5 8.9 - 10.3 mg/dL   Total Protein 7.4 6.5 - 8.1 g/dL   Albumin 4.0 3.5 - 5.0 g/dL   AST 21 15 - 41 U/L   ALT 16 14 - 54 U/L   Alkaline Phosphatase 88 38 - 126 U/L   Total Bilirubin 0.6 0.3 - 1.2 mg/dL   GFR calc non Af Amer 50 (L) >60 mL/min   GFR calc Af Amer 58 (L) >60 mL/min    Comment: (NOTE) The eGFR has been calculated using the CKD EPI equation. This calculation has not been validated in all clinical situations. eGFR's persistently <60 mL/min signify possible Chronic Kidney Disease.    Anion gap 9 5 - 15   No results found.  Review of Systems  Constitutional: Negative.    HENT: Negative.   Eyes: Negative.   Respiratory: Negative.   Cardiovascular: Negative.   Skin: Negative.   All other systems reviewed and are negative.   There were no vitals taken for this visit. Physical Exam  Vitals reviewed. Constitutional: She is oriented to person, place, and time. She appears well-developed and well-nourished.  HENT:  Head: Normocephalic and atraumatic.  Cardiovascular: Normal rate, regular rhythm and normal heart sounds. Exam reveals no gallop and no friction rub.  No murmur heard. Respiratory: Effort normal and breath sounds normal. No respiratory distress. She has no wheezes. She has no rales.  Neurological: She is alert and oriented to person, place, and time.  Skin: Skin is warm and dry.  Breast: Healed left mastectomy scar  Assessment/Plan Left breast carcinoma, need for central venous access Plan: Patient scheduled for Port-A-Cath insertion on 10/13/2017.  The risks and benefits of the procedure were explained to the patient, who gave informed consent.  Aviva Signs, MD 10/09/2017, 12:23 PM

## 2017-10-09 NOTE — Progress Notes (Signed)
Chemotherapy teaching pulled together. 

## 2017-10-09 NOTE — Addendum Note (Signed)
Addended by: Farley Ly on: 10/09/2017 10:48 AM   Modules accepted: Orders

## 2017-10-09 NOTE — Progress Notes (Addendum)
[facility] Initial Visit:  Patient Care Team: Lucia Gaskins, MD as PCP - General (Internal Medicine)  CHIEF COMPLAINTS/PURPOSE OF CONSULTATION:  Triple negative stage IB left breast cancer with high grade DCIS.  HISTORY OF PRESENTING ILLNESS: Kelli Wilson 66 y.o. postmenopausal female is here with her dauther for evaluation of triple negative high grade invasive ductal carcinoma of the left breast with high grade DCIS. Patient was referred to me by Dr. Arnoldo Morale.  Patient was found to have a mass in the upper outer quadrant on screening mammogram.  Core biopsy was performed which revealed invasive carcinoma.  I do not have any of her mammograms available to me.  Dr. Arnoldo Morale performed a left modified radical mastectomy with lymph node dissection on 09/15/2017.  Surgical path demonstrated invasive ductal carcinoma, grade 3, spanning 1.8 cm, high-grade ductal carcinoma in situ, resection margins were negative, 8 out of 8 lymph nodes were negative for carcinoma.  Patient states that she was adopted but knew her birth mother.  Her birth mother did not have breast cancer.  She states she had a half sister who had breast cancer in her 39s.  Patient states that she tolerated surgery well.  She still has some swelling at the surgical site and down her left arm.  She denies any chest pain, shortness breath, abdominal pain, focal weakness.  She denies any nausea, vomiting, decreased appetite, weight changes. She states she has chronic baseline diarrhea due to having her gallbladder removed.  She also has some mild neuropathy in her fingertips and toes due to diabetes.  Review of Systems - Oncology ROS as per HPI otherwise 12 point ROS is negative.  MEDICAL HISTORY: Past Medical History:  Diagnosis Date  . Anxiety   . Arthritis   . Back pain   . 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 Chest pain   . Chronic pain   . Depression   . Diabetes mellitus  without complication ([place])   . Fibromyalgia   . GERD (gastroesophageal reflux disease)   . Hypercholesteremia   . Hypertension   . Knee pain   . Obesity   . Peptic ulcer    history of - normal GI studies Sept 2010    SURGICAL HISTORY: Past Surgical History:  Procedure Laterality Date  . APPENDECTOMY    . bladder rectal repair    . BREAST SURGERY Left    breast biopsy- benign  . CHOLECYSTECTOMY    . DILATION AND CURETTAGE OF UTERUS    . GANGLION CYST EXCISION Left   . MASTECTOMY MODIFIED RADICAL Left 09/15/2017   Procedure: LEFT MODIFIED RADICAL MASTECTOMY;  Surgeon: Aviva Signs, MD;  Location: [facility];  Service: General;  Laterality: Left;  . TOTAL ABDOMINAL HYSTERECTOMY      SOCIAL HISTORY: Social History   Socioeconomic History  . Marital status: Married    Spouse name: Not on file  . Number of children: Not on file  . Years of education: Not on file  . Highest education level: Not on file  Social Needs  . Financial resource strain: Not on file  . Food insecurity - worry: Not on file  . Food insecurity - inability: Not on file  . Transportation needs - medical: Not on file  . Transportation needs - non-medical: Not on file  Occupational History  . 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 Smoking  status: Never Smoker  . Smokeless tobacco: Current User    Types: Snuff  Substance and Sexual Activity  . Alcohol use: No  . Drug use: No  . Sexual activity: No    Birth control/protection: Surgical  Other Topics Concern  . Not on file  Social History Narrative  . Not on file    FAMILY HISTORY Family History  Problem Relation Age of Onset  . Aneurysm Father     ALLERGIES:  is allergic to other; iohexol; ivp dye [iodinated diagnostic agents]; latex; motrin [ibuprofen]; nsaids; statins; sulfa antibiotics; and adhesive [tape].  MEDICATIONS:  Current Outpatient Medications  Medication Sig Dispense Refill  .  albuterol (PROVENTIL) (2.5 MG/3ML) 0.083% nebulizer solution Take 2.5 mg by nebulization every 6 (six) hours as needed for wheezing or shortness of breath.     . ALPRAZolam (XANAX) 1 MG tablet Take 1 mg by mouth 2 (two) times daily as needed for anxiety.    . Cholecalciferol (VITAMIN D3) 5000 units CAPS Take 5,000 Units by mouth daily.    . hydrOXYzine (ATARAX/VISTARIL) 50 MG tablet Take 50 mg by mouth at bedtime.     . Lidocaine 0.5 % GEL Apply 1 application topically as needed (for pain).    . metFORMIN (GLUCOPHAGE) 500 MG tablet Take 500 mg by mouth 2 (two) times daily with a meal.     . nitroGLYCERIN (NITROSTAT) 0.4 MG SL tablet Place 0.4 mg under the tongue every 5 (five) minutes as needed for chest pain.    Marland Kitchen omeprazole (PRILOSEC) 40 MG capsule Take 40 mg by mouth at bedtime.     Marland Kitchen oxyCODONE-acetaminophen (PERCOCET) 7.5-325 MG tablet Take 1 tablet by mouth every 4 (four) hours as needed. 40 tablet 0  . potassium chloride 20 MEQ TBCR Take 20 mEq by mouth 2 (two) times daily. 10 tablet 0  . pravastatin (PRAVACHOL) 40 MG tablet Take 40 mg by mouth daily.    . promethazine (PHENERGAN) 25 MG tablet Take 25 mg by mouth every 6 (six) hours as needed for nausea or vomiting.    . Pseudoeph-Doxylamine-DM-APAP (NYQUIL PO) Take 1 capsule by mouth as needed (for congestion).     No current facility-administered medications for this visit.     PHYSICAL EXAMINATION:  ECOG PERFORMANCE STATUS: 0 - Asymptomatic  Pressure 134/82, pulse 90, respiratory rate 16, temperature 97.8, O2 sat 97%, Wt 272.5 lbs, height 60.7 in.  Physical Exam Constitutional: Well-developed, well-nourished, and in no distress.   HENT:  Head: Normocephalic and atraumatic.  Mouth/Throat: No oropharyngeal exudate. Mucosa moist. Eyes: Pupils are equal, round, and reactive to light. Conjunctivae are normal. No scleral icterus.  Neck: Normal range of motion. Neck supple. No JVD present.  Cardiovascular: Normal rate, regular rhythm  and normal heart sounds.  Exam reveals no gallop and no friction rub.   No murmur heard. Pulmonary/Chest: Effort normal and breath sounds normal. No respiratory distress. No wheezes.No rales.  Abdominal: Soft. Bowel sounds are normal. No distension. There is no tenderness. There is no guarding.  Musculoskeletal: No edema or tenderness.  Lymphadenopathy:    No cervical or supraclavicular adenopathy.  Neurological: Alert and oriented to person, place, and time. No cranial nerve deficit.  Skin: Skin is warm and dry. No rash noted. No erythema. No pallor.  Psychiatric: Affect and judgment normal.  Breast: Left mastectomy site well healed. Right breast without masses, nipple discharge, axillary lymphadenopathy.    LABORATORY DATA: I have personally reviewed the data as listed:  Admission on  09/15/2017, Discharged on 09/16/2017  Component Date Value Ref Range Status  . Glucose-Capillary 09/15/2017 145* 65 - 99 mg/dL Final  . Glucose-Capillary 09/15/2017 130* 65 - 99 mg/dL Final  . Sodium 09/16/2017 134* 135 - 145 mmol/L Final  . Potassium 09/16/2017 4.2  3.5 - 5.1 mmol/L Final  . Chloride 09/16/2017 99* 101 - 111 mmol/L Final  . CO2 09/16/2017 26  22 - 32 mmol/L Final  . Glucose, Bld 09/16/2017 139* 65 - 99 mg/dL Final  . BUN 09/16/2017 14  6 - 20 mg/dL Final  . Creatinine, Ser 09/16/2017 1.28* 0.44 - 1.00 mg/dL Final  . Calcium 09/16/2017 8.1* 8.9 - 10.3 mg/dL Final  . GFR calc non Af Amer 09/16/2017 42* >60 mL/min Final  . GFR calc Af Amer 09/16/2017 49* >60 mL/min Final   Comment: (NOTE) The eGFR has been calculated using the CKD EPI equation. This calculation has not been validated in all clinical situations. eGFR's persistently <60 mL/min signify possible Chronic Kidney Disease.   . Anion gap 09/16/2017 9  5 - 15 Final  . WBC 09/16/2017 10.7* 4.0 - 10.5 K/uL Final  . RBC 09/16/2017 3.95  3.87 - 5.11 MIL/uL Final  . Hemoglobin 09/16/2017 11.7* 12.0 - 15.0 g/dL Final  . HCT  09/16/2017 35.0* 36.0 - 46.0 % Final  . MCV 09/16/2017 88.6  78.0 - 100.0 fL Final  . MCH 09/16/2017 29.6  26.0 - 34.0 pg Final  . MCHC 09/16/2017 33.4  30.0 - 36.0 g/dL Final  . RDW 09/16/2017 14.0  11.5 - 15.5 % Final  . Platelets 09/16/2017 201  150 - 400 K/uL Final  . Glucose-Capillary 09/15/2017 126* 65 - 99 mg/dL Final  . Comment 1 09/15/2017 Notify RN   Final  . Comment 2 09/15/2017 Document in Chart   Final  . Glucose-Capillary 09/16/2017 155* 65 - 99 mg/dL Final  . Comment 1 09/16/2017 Notify RN   Final  . Comment 2 09/16/2017 Document in [facility] Outpatient Visit on 09/10/2017  Component Date Value Ref Range Status  . Hgb A1c MFr Bld 09/10/2017 6.3* 4.8 - 5.6 % Final   Comment: (NOTE) Pre diabetes:          5.7%-6.4% Diabetes:              >6.4% Glycemic control for   <7.0% adults with diabetes   . Mean Plasma Glucose 09/10/2017 134.11  mg/dL Final   Performed at [facility] [address], [place], [place] [address]  . WBC 09/10/2017 10.3  4.0 - 10.5 K/uL Final  . RBC 09/10/2017 4.59  3.87 - 5.11 MIL/uL Final  . Hemoglobin 09/10/2017 13.3  12.0 - 15.0 g/dL Final  . HCT 09/10/2017 39.6  36.0 - 46.0 % Final  . MCV 09/10/2017 86.3  78.0 - 100.0 fL Final  . MCH 09/10/2017 29.0  26.0 - 34.0 pg Final  . MCHC 09/10/2017 33.6  30.0 - 36.0 g/dL Final  . RDW 09/10/2017 13.7  11.5 - 15.5 % Final  . Platelets 09/10/2017 228  150 - 400 K/uL Final  . Neutrophils Relative % 09/10/2017 49  % Final  . Neutro Abs 09/10/2017 5.0  1.7 - 7.7 K/uL Final  . Lymphocytes Relative 09/10/2017 44  % Final  . Lymphs Abs 09/10/2017 4.6* 0.7 - 4.0 K/uL Final  . Monocytes Relative 09/10/2017 5  % Final  . Monocytes Absolute 09/10/2017 0.5  0.1 - 1.0 K/uL Final  . Eosinophils  Relative 09/10/2017 2  % Final  . Eosinophils Absolute 09/10/2017 0.2  0.0 - 0.7 K/uL Final  . Basophils Relative 09/10/2017 0  % Final  . Basophils Absolute 09/10/2017 0.0  0.0 - 0.1 K/uL Final  .  Sodium 09/10/2017 138  135 - 145 mmol/L Final  . Potassium 09/10/2017 3.2* 3.5 - 5.1 mmol/L Final  . Chloride 09/10/2017 103  101 - 111 mmol/L Final  . CO2 09/10/2017 24  22 - 32 mmol/L Final  . Glucose, Bld 09/10/2017 170* 65 - 99 mg/dL Final  . BUN 09/10/2017 12  6 - 20 mg/dL Final  . Creatinine, Ser 09/10/2017 0.95  0.44 - 1.00 mg/dL Final  . Calcium 09/10/2017 8.9  8.9 - 10.3 mg/dL Final  . Total Protein 09/10/2017 7.2  6.5 - 8.1 g/dL Final  . Albumin 09/10/2017 3.8  3.5 - 5.0 g/dL Final  . AST 09/10/2017 22  15 - 41 U/L Final  . ALT 09/10/2017 18  14 - 54 U/L Final  . Alkaline Phosphatase 09/10/2017 89  38 - 126 U/L Final  . Total Bilirubin 09/10/2017 0.3  0.3 - 1.2 mg/dL Final  . GFR calc non Af Amer 09/10/2017 >60  >60 mL/min Final  . GFR calc Af Amer 09/10/2017 >60  >60 mL/min Final   Comment: (NOTE) The eGFR has been calculated using the CKD EPI equation. This calculation has not been validated in all clinical situations. eGFR's persistently <60 mL/min signify possible Chronic Kidney Disease.   . Anion gap 09/10/2017 11  5 - 15 Final  . ABO/RH(D) 09/10/2017 O POS   Final  . Antibody Screen 09/10/2017 NEG   Final  . Sample Expiration 09/10/2017 09/24/2017   Final  . Extend sample reason 09/10/2017 NO TRANSFUSIONS OR PREGNANCY IN THE PAST 3 MONTHS   Final    RADIOGRAPHIC STUDIES: I have personally reviewed the radiological images as listed and agree with the findings in the report  No results found.  PATHOLOGY: Patient: Kelli Wilson, Kelli Wilson Collected: 09/15/2017 Client: [facility] Accession: [id number] Received: 09/15/2017 Aviva Signs DOB: [birth year] Age: 89 Gender: F Reported: 09/16/2017 [address] Patient Ph: [phone] MRN #: [id number] [place], [place] [address] Visit #: [id number] Chart #: Phone: [phone] Fax: CC: REPORT OF SURGICAL PATHOLOGY FINAL DIAGNOSIS Diagnosis Breast, modified radical mastectomy , left - INVASIVE DUCTAL CARCINOMA,  GRADE 3, SPANNING 1.8 CM. - HIGH GRADE DUCTAL CARCINOMA IN SITU. - RESECTION MARGINS ARE NEGATIVE. - EIGHT OF EIGHT LYMPH NODES NEGATIVE FOR CARCINOMA (0/8). - SEE ONCOLOGY TABLE. Microscopic Comment BREAST, INVASIVE TUMOR Procedure: Left modified radical mastectomy. Laterality: Left. Tumor Size: 1.8 cm. Histologic Type: Invasive ductal carcinoma. Grade: 3 Tubular Differentiation: 3 Nuclear Pleomorphism: 2 Mitotic Count: 3 Ductal Carcinoma in Situ (DCIS): Present, high grade. Extent of Tumor: Confined to breast parenchyma. Margins: Invasive carcinoma, distance from closest margin: 2 cm (posterior). DCIS, distance from closest margin: 2 cm (posterior). Regional Lymph Nodes: Number of Lymph Nodes Examined: 8 Number of Sentinel Lymph Nodes Examined: 0 Lymph Nodes with Macrometastases: 0 Lymph Nodes with Micrometastases: 0 Lymph Nodes with Isolated Tumor Cells: 0 Breast Prognostic Profile: Performed on biopsy [id number], see below. Will not be repeated. Estrogen Receptor: Negative. Progesterone Receptor: Negative. Her2: Negative (1.30 ratio). 1 of 3 FINAL for MONTGOMERY, ROTHLISBERGER ([id number]) Microscopic Comment(continued) Ki-67: 15% Best tumor block for sendout testing: 1D Pathologic Stage Classification (pTNM, AJCC 8th Edition): Primary Tumor (pT): pT1c Regional Lymph Nodes (pN): pN0 Distant Metastases (pM): pMX Comments: None. JULIA MANNY  MD Pathologist, Electronic Signature   ASSESSMENT/PLAN Cancer Staging Malignant neoplasm of upper-outer quadrant of left female breast ([place]) Staging form: Breast, AJCC 8th Edition - Clinical: cT1c, cN0 - Unsigned - Pathologic: Stage IB (pT1c, pN0, cM0, G3, ER: Negative, PR: Negative, HER2: Negative) - Signed by Twana First, MD on 10/09/2017  -I have reviewed with her her stage and surgical path in detail with her and her daughter today.  -Since her tumor is >1 cm and triple negative, I have recommended adjuvant chemotherapy with  ddAC q2 weeks for 4 cycles followed by weekly taxol for 12 cycles.   -I have reviewed side effects of adriamycin, cytoxan, and taxol in detail with her today. I will get her set up for chemo teaching with our nurse again as well prior to her starting chemo. -Stat consult to Dr. Arnoldo Morale for chemoport placement. -Tentatively plan to start her chemo the week of 11.26.18 after her chemoport is placed. -Stat baseline ECHO prior to starting anthracycline chemo ordered today. -Since she had a total mastectomy with surgical axillary stating and her tumor was less than 5 cm, with margins > 23m, and negative axillary lymph nodes, she will not need adjuvant radiation. -RTC for follow up 1 week after first cycle of ddAC to assess tolerability to chemo.    All questions were answered. The patient knows to call the clinic with any problems, questions or concerns.  This note was electronically signed.    LTwana First MD  10/09/2017 9:29 AM

## 2017-10-09 NOTE — Patient Instructions (Signed)
[place]   CHEMOTHERAPY INSTRUCTIONS  You have been diagnosed with Stage 1 triple negative breast cancer.  We are going to treat you with 4 cycles (1 cycle is 2 weeks) of Adriamycin and Cytoxan.  Then you will have 12 weekly cycles of Taxol.  This treatment is with curative intent.  You will see the doctor regularly throughout treatment.  We monitor your lab work prior to every treatment.  The doctor monitors your response to treatment by the way you are feeling, your blood work, and scans periodically.  There will be wait times while you are here for treatment.  It will take about 30 minutes to 1 hour for your lab work to result.  Then pharmacy will have to mix your medications for your treatment.   You will receive the following premedications prior to each chemotherapy treatment of Adriamycin and Cytoxan: Premeds: Aloxi - high powered nausea/vomiting prevention medication used for chemotherapy patients. Emend - high powered nausea/vomiting prevention medication used for chemotherapy patients. Dexamethasone - steroid - given to reduce the risk of you having an allergic type reaction to the chemotherapy. Dex can cause you to feel energized, nervous/anxious/jittery, make you have trouble sleeping, and/or make you feel hot/flushed in the face/neck and/or look pink/red in the face/neck. These side effects will pass as the Dex wears off. (takes 20 minutes to infuse)  You will receive the the following premedications prior to each of the Taxol treatments: Premeds: Benadryl: helps prevent reaction to chemotherapy.  Pepcid: an antihistamine that is used to reduce indigestion and heartburn, which can feel like and sometimes lead to nausea and vomiting. Dexamethasone - steroid - given to reduce the risk of you having an allergic type reaction to the chemotherapy. Dex can cause you to feel energized, nervous/anxious/jittery, make you have trouble sleeping, and/or make you feel  hot/flushed in the face/neck and/or look pink/red in the face/neck. These side effects will pass as the Dex wears off. (takes 30 minutes to infuse)  You will also receive an injection called neulasta after each Adriamycin and Cytoxan treatment (only for the first 4 cycles):  Neulasta - this medication is not chemo but being given because you have had chemo. It is usually given 24-27 hours after the completion of chemotherapy. This medication works by boosting your bone marrow's supply of white blood cells. White blood cells are what protect our bodies against infection. The medication is given in the form of a subcutaneous injection. It is given in the fatty tissue of your abdomen. It is a short needle. The major side effect of this medication is bone or muscle pain. The drug of choice to relieve or lessen the pain is Aleve or Ibuprofen. If a physician has ever told you not to take Aleve or Ibuprofen - then don't take it. You should then take Tylenol/acetaminophen. Take either medication as the bottle directs you to.  The level of pain you experience as a result of this injection can range from none, to mild or moderate, or severe. Please let us know if you develop moderate or severe bone pain.   You can take Claritin 10 mg over the counter for a few days after receiving neulasta to help with the bone aches and pains.    **DO NOT expose the Neulasta On-body injector to diagnostic imaging (CT scans, MRI, Ultrasound, X-ray), radiation treatment, or oxygen rich environments, such as hyperbaric chambers. **       POTENTIAL SIDE EFFECTS  OF TREATMENT:  Cyclophosphamide (Generic Name) Other Names: Cytoxan, Neosar  About This Drug Cyclophosphamide is a drug used to treat cancer. It is given in the vein (IV) or by mouth.  Takes 30 minutes for this drug to infuse.  Possible Side Effects (More Common) . Nausea and throwing up (vomiting). These symptoms may happen within a few hours after your treatment and  may last up to 72 hours. Medicines are available to stop or lessen these side effects. . Bone marrow depression. This is a decrease in the number of white blood cells, red blood cells, and platelets. This may raise your risk of infection, make you tired and weak (fatigue), and raise your risk of bleeding. . Hair loss: You may notice hair getting thin. Some patients lose their hair. Hair loss is often complete scalp hair loss and can involve loss of eyebrows, eyelashes, and pubic hair. You may notice this a few days or weeks after treatment has started. Most often hair loss is temporary; your hair should grow back when treatment is done. . Decreased appetite (decreased hunger) . Blurred vision . Soreness of the mouth and throat. You may have red areas, white patches, or sores that hurt. . Effects on the bladder. This drug may cause irritation and bleeding in the bladder. You may have blood in your urine. To help stop this, you will get extra fluids to help you pass more urine. You may get a drug called mesna, which helps to decrease irritation and bleeding. You may also get a medicine to help you pass more urine. You may have a catheter (tube) placed in your bladder so that your bladder will be washed with this drug.  Possible Side Effects (Less Common) . Darkening of the skin or nails . Metallic taste in the mouth . Changes in lung tissue may happen with large amounts of this drug. These changes may not last forever, and your lung tissue may go back to normal. Sometimes these changes may not be seen for many years. You may get a cough or have trouble catching your breath.  Allergic Reactions   Serious allergic reactions including anaphylaxis are rare. While you are getting this drug in your vein (IV), tell your nurse right away if you have any of these symptoms of an allergic reaction: . Trouble catching your breath . Feeling like your tongue or throat are swelling . Feeling your heart beat quickly  or in a not normal way (palpitations) . Feeling dizzy or lightheaded . Flushing, itching, rash, and/or hives  Treating Side Effects . Drink 6-8 cups of fluids each day unless your doctor has told you to limit your fluid intake due to some other health problem. A cup is 8 ounces of fluid. If you throw up or have loose bowel movements you should drink more fluids so that you do not become dehydrated (lack water in the body due to losing too much fluid). . Ask your doctor or nurse about medicine that is available to help stop or lessen nausea or throwing up. . Mouth care is very important. Your mouth care should consist of routine, gentle cleaning of your teeth or dentures and rinsing your mouth with a mixture of 1/2 teaspoon of salt in 8 ounces of water or  teaspoon of baking soda in 8 ounces of water. This should be done at least after each meal and at bedtime. . If you have mouth sores, avoid mouthwash that has alcohol. Also avoid alcohol and smoking because  they can bother your mouth and throat. . Talk with your nurse about getting a wig before you lose your hair. Also, call the [facility] at 800-ACS-2345 to find out information about the " Look Good.Marland KitchenMarland KitchenFeel Better" program close to where you live. It is a free program where women undergoing chemotherapy learn about wigs, turbans and scarves as well as makeup techniques and skin and nail care.  Important Information . Whenever you tell a doctor or nurse your health history, always tell them that you have received cyclophosphamide in the past. . If you take this drug by mouth swallow the medicine whole. Do not chew, break or crush it. . You can take the medicine with or without food. If you have nausea, take it with food. Do not take the pills at bedtime.  Food and Drug Interactions There are no known interactions of cyclophosphamide with food. This drug may interact with other medicines. Tell your doctor and pharmacist about all the  medicines and dietary supplements (vitamins, minerals, herbs and others) that you are taking at this time. The safety and use of dietary supplements and alternative diets are often not known. Using these might affect your cancer or interfere with your treatment. Until more is known, you should not use dietary supplements or alternative diets without your cancer doctor's help.  When to Call the Doctor Call your doctor or nurse right away if you have any of these symptoms: . Fever of 100.5 F (38 C) or higher . Chills . Bleeding or bruising that is not normal . Blurred vision or other changes in eyesight . Pain when passing urine; blood in urine . Pain in your lower back or side . Wheezing or trouble breathing . Swelling of legs, ankles, or feet . Feeling dizzy or lightheaded . Feeling confused or agitated . Signs of liver problems: dark urine, pale bowel movements, bad stomach pain, feeling very tired and weak, unusual itching, or yellowing of the eyes or skin . Unusual thirst or passing urine often . Nausea that stops you from eating or drinking . Throwing up more than 3 times a day  Call your doctor or nurse as soon as possible if any of these symptoms happen: . Pain in your mouth or throat that makes it hard to eat or drink . Nausea not relieved by prescribed medicines  Sexual Problems and Reproductive Concerns . Infertility warning: Sexual problems and reproduction concerns may happen. In both men and women, this drug may affect your ability to have children. This cannot be determined before your treatment. Talk with your doctor or nurse if you plan to have children. Ask for information on sperm or egg banking. . In men, this drug may interfere with your ability to make sperm, but it should not change your ability to have sexual relations. . In women, menstrual bleeding may become irregular or stop while you are getting this drug. Do not assume that you cannot become pregnant if you do  not have a menstrual period. . Women may go through signs of menopause (change of life) like vaginal dryness or itching. Vaginal lubricants can be used to lessen vaginal dryness, itching, and pain during sexual relations. . Genetic counseling is available for you to talk about the effects of this drug therapy on future pregnancies. Also, a genetic counselor can look at the possible risk of problems in the unborn baby due to this medicine if an exposure happens during pregnancy. . Pregnancy warning: This drug may have  harmful effects on the unborn child, so effective methods of birth control should be used during your cancer treatment. . Breast feeding warning: Women should not breast feed during treatment because this drug could enter the breast milk and badly harm a breast feeding baby   Doxorubicin (Generic Name) Other Names: Adriamycin, hydroxyl daunorubicin  About This Drug Doxorubicin is a drug used to treat cancer. This drug is given in the vein (IV).  This drug is an IV push over about 5-10 minutes.     Possible Side Effects (More Common) . Bone marrow depression. This is a decrease in the number of white blood cells, red blood cells, and platelets. This may raise your risk of infection, make you tired and weak (fatigue), and raise your risk of bleeding. . Hair loss: Hair loss is often complete scalp hair loss and can involve loss of eyebrows, eyelashes, and pubic hair. You may notice this a few days or weeks after treatment has started. Most often hair loss is temporary; your hair should grow back when treatment is done. . Nausea and throwing up (vomiting). These symptoms may happen within a few hours after your treatment and may last up to 24 hours. Medicines are available to stop or lessen these side effects. . Soreness of the mouth and throat. You may have red areas, white patches, or sores that hurt. . Change in the color of your urine to pink or red. This color change will go away in  one to two days. . Effects on the heart: This drug can weaken the heart and lower heart function. Your heart function will be checked as needed. You may have trouble catching your breath, mainly during activities. You may also have trouble breathing while lying down, and have swelling in your ankles. . Sensitivity to light (photosensitivity). Photosensitivity means that you may become more sensitive to the effects of the sun, sun lamps, and tanning beds. Your eyes may water more, mostly in bright light. . Metallic taste in the mouth: This may change the taste of food and drinks . Decreased appetite (decreased hunger) . Darkening of the skin or nails . Weakness that interferes with your daily activities    Possible Side Effects (Less Common) . Skin and tissue irritation may involve redness, pain, warmth, or swelling at the IV site. This happens if the drug leaks out of the vein and into nearby tissue. . Changes in your liver function. Your doctor will check your liver function as needed. . This drug may cause an increased risk of developing a second cancer  Allergic Reaction Serious allergic reactions, including anaphylaxis are rare. While you are getting this drug in your vein (IV), tell your nurse right away if you have any of these symptoms of an allergic reaction: . Trouble catching your breath . Feeling like your tongue or throat are swelling . Feeling your heart beat quickly or in a not normal way (palpitations) . Feeling dizzy or lightheaded . Flushing, itching, rash, and/or hives  Treating Side Effects . Drink 6-8 cups of fluids every day unless your doctor has told you to limit your fluid intake due to some other health problem. A cup is 8 ounces of fluid. If you vomit or have diarrhea, you should drink more fluids so that you do not become dehydrated (lack water in the body due to losing too much fluid). . Ask your doctor or nurse about medicine that is available to help stop or  lessen nausea, throwing up,  and/or loose bowel movements . Wear dark sunglasses and use sunscreen with SPF 30 or higher when you are outdoors even for a short time. Cover up when you are out in the sun. Wear wide-brimmed hats, long-sleeved shirts, and pants. Keep your neck, chest, and back covered. . Mouth care is very important. Your mouth care should consist of routine, gentle cleaning of your teeth or dentures and rinsing your mouth with a mixture of 1/2 teaspoon of salt in 8 ounces of water or  teaspoon of baking soda in 8 ounces of water. This should be done at least after each meal and at bedtime. . If you have mouth sores, avoid mouthwash that has alcohol. Avoid alcohol and smoking because they can bother your mouth and throat. . Talk with your nurse about getting a wig before you lose your hair. Also, call the [facility] at 800-ACS-2345 to find out information about the "Look Good, Feel Better" program close to where you live. It is a free program where women getting chemotherapy can learn about wigs, turbans and scarves as well as makeup techniques and skin and nail care. . While you are getting this drug, please tell your nurse right away if you have any pain, redness, or swelling at the site of the IV infusion.  Food and Drug Interactions There are no known interactions of doxorubicin with food. This drug may interact with other medicines. Tell your doctor and pharmacist about all the medicines and dietary supplements (vitamins, minerals, herbs and others) that you are taking at this time. The safety and use of dietary supplements and alternative diets are often not known. Using these might affect your cancer or interfere with your treatment. Until more is known, you should not use dietary supplements or alternative diets without your cancer doctor's help.  When to Call the Doctor Call your doctor or nurse right away if you have any of these symptoms: . Fever of 100.5 F (38  C) or above . Chills . Easy bruising or bleeding . Wheezing or trouble breathing . Rash or itching . Feeling dizzy or lightheaded . Feeling that your heart is beating in a fast or not normal way (palpitations) . Loose bowel movements (diarrhea) more than 4 times a day or diarrhea with weakness or feeling lightheaded . Nausea that stops you from eating or drinking . Throwing up more than 3 times a day . Signs of liver problems: dark urine, pale bowel movements, bad stomach pain, feeling very tired and weak, unusual itching, or yellowing of the eyes or skin, . During the IV infusion, if you have pain, redness, or swelling at the site of the IV infusion, please tell your nurse right away  Call your doctor or nurse as soon as possible if any of these symptoms happen: . Decreased urine . Pain in your mouth or throat that makes it hard to eat or drink . Nausea and throwing up that is not relieved by prescribed medicines . Rash that is not relieved by prescribed medicines . Swelling of legs, ankles, or feet . Weight gain of 5 pounds in one week (fluid retention) . Lasting loss of appetite or rapid weight loss of five pounds in a week . Fatigue that interferes with your daily activities . Extreme weakness that interferes with normal activities  Sexual Problems and Reproduction Concerns . Infertility warning: Sexual problems and reproduction concerns may happen. In both men and women, this drug may affect your ability to have children. This  cannot be determined before your treatment. Talk with your doctor or nurse if you plan to have children. Ask for information on sperm or egg banking. . In men, this drug may interfere with your ability to make sperm, but it should not change your ability to have sexual relations. . In women, menstrual bleeding may become irregular or stop while you are getting this drug. Do not assume that you cannot become pregnant if you do not have a menstrual  period.   Paclitaxel (Taxol)  About This Drug Paclitaxel is a drug used to treat cancer. It is given in the vein (IV).  Possible Side Effects . Hair loss. Hair loss is often temporary, although with certain medicine, hair loss can sometimes be permanent. Hair loss may happen suddenly or gradually. If you lose hair, you may lose it from your head, face, armpits, pubic area, chest, and/or legs. You may also notice your hair getting thin. . Swelling of your legs, ankles and/or feet (edema) . Flushing . Nausea and throwing up (vomiting) . Loose bowel movements (diarrhea) . Bone marrow depression. This is a decrease in the number of white blood cells, red blood cells, and platelets. This may raise your risk of infection, make you tired and weak (fatigue), and raise your risk of bleeding. . Effects on the nerves are called peripheral neuropathy. You may feel numbness, tingling, or pain in your hands and feet. It may be hard for you to button your clothes, open jars, or walk as usual. The effect on the nerves may get worse with more doses of the drug. These effects get better in some people after the drug is stopped but it does not get better in all people. . Changes in your liver function . Bone, joint and muscle pain . Abnormal EKG . Allergic reaction: Allergic reactions, including anaphylaxis are rare but may happen in some patients. Signs of allergic reaction to this drug may be swelling of the face, feeling like your tongue or throat are swelling, trouble breathing, rash, itching, fever, chills, feeling dizzy, and/or feeling that your heart is beating in a fast or not normal way. If this happens, do not take another dose of this drug. You should get urgent medical treatment. . Infection . Changes in your kidney function. Note: Each of the side effects above was reported in 20% or greater of patients treated with paclitaxel. Not all possible side effects are included above.  Warnings and  Precautions . Severe allergic reactions . Severe bone marrow depression  Treating Side Effects . To help with hair loss, wash with a mild shampoo and avoid washing your hair every day. . Avoid rubbing your scalp, instead, pat your hair or scalp dry . Avoid coloring your hair . Limit your use of hair spray, electric curlers, blow dryers, and curling irons. . If you are interested in getting a wig, talk to your nurse. You can also call the [facility] at 800-ACS-2345 to find out information about the "Look Good, Feel Better" program close to where you live. It is a free program where women getting chemotherapy can learn about wigs, turbans and scarves as well as makeup techniques and skin and nail care. . Ask your doctor or nurse about medicines that are available to help stop or lessen diarrhea and/or nausea. . To help with nausea and vomiting, eat small, frequent meals instead of three large meals a day. Choose foods and drinks that are at room temperature. Ask your nurse or  doctor about other helpful tips and medicine that is available to help or stop lessen these symptoms. . If you get diarrhea, eat low-fiber foods that are high in protein and calories and avoid foods that can irritate your digestive tracts or lead to cramping. Ask your nurse or doctor about medicine that can lessen or stop your diarrhea. . Mouth care is very important. Your mouth care should consist of routine, gentle cleaning of your teeth or dentures and rinsing your mouth with a mixture of 1/2 teaspoon of salt in 8 ounces of water or  teaspoon of baking soda in 8 ounces of water. This should be done at least after each meal and at bedtime. . If you have mouth sores, avoid mouthwash that has alcohol. Also avoid alcohol and smoking because they can bother your mouth and throat. . Drink plenty of fluids (a minimum of eight glasses per day is recommended). . Take your temperature as your doctor or nurse tells you,  and whenever you feel like you may have a fever. . Talk to your doctor or nurse about precautions you can take to avoid infections and bleeding. . Be careful when cooking, walking, and handling sharp objects and hot liquids.  Food and Drug Interactions . There are no known interactions of paclitaxel with food. . This drug may interact with other medicines. Tell your doctor and pharmacist about all the medicines and dietary supplements (vitamins, minerals, herbs and others) that you are taking at this time. . The safety and use of dietary supplements and alternative diets are often not known. Using these might affect your cancer or interfere with your treatment. Until more is known, you should not use dietary supplements or alternative diets without your cancer doctor's help.  When to Call the Doctor Call your doctor or nurse if you have any of the following symptoms and/or any new or unusual symptoms: . Fever of 100.5 F (38 C) or above . Chills . Redness, pain, warmth, or swelling at the IV site during the infusion . Signs of allergic reaction: swelling of the face, feeling like your tongue or throat are swelling, trouble breathing, rash, itching, fever, chills, feeling dizzy, and/or feeling that your heart is beating in a fast or not normal way . Feeling that your heart is beating in a fast or not normal way (palpitations) . Weight gain of 5 pounds in one week (fluid retention) . Decreased urine or very dark urine . Signs of liver problems: dark urine, pale bowel movements, bad stomach pain, feeling very tired and weak, unusual itching, or yellowing of the eyes or skin . Heavy menstrual period that lasts longer than normal . Easy bruising or bleeding . Nausea that stops you from eating or drinking, and/or that is not relieved by prescribed medicines. . Loose bowel movements (diarrhea) more than 4 times a day or diarrhea with weakness or lightheadedness . Pain in your mouth or throat that  makes it hard to eat or drink . Lasting loss of appetite or rapid weight loss of five pounds in a week . Signs of peripheral neuropathy: numbness, tingling, or decreased feeling in fingers or toes; trouble walking or changes in the way you walk; or feeling clumsy when buttoning clothes, opening jars, or other routine activities . Joint and muscle pain that is not relieved by prescribed medicines . Extreme fatigue that interferes with normal activities . While you are getting this drug, please tell your nurse right away if you have any pain,  redness, or swelling at the site of the IV infusion. . If you think you are pregnant.  Reproduction Warnings . Pregnancy warning: This drug may have harmful effects on the unborn child, it is recommended that effective methods of birth control should be used during your cancer treatment. Let your doctor know right away if you think you may be pregnant. . Breast feeding warning: Women should not breast feed during treatment because this drug could enter the breastmilk and cause harm to a breast feeding baby. SELF CARE ACTIVITIES WHILE ON CHEMOTHERAPY: Hydration Increase your fluid intake 48 hours prior to treatment and drink at least 8 to 12 cups (64 ounces) of water/decaff beverages per day after treatment. You can still have your cup of coffee or soda but these beverages do not count as part of your 8 to 12 cups that you need to drink daily. No alcohol intake.  Medications Continue taking your normal prescription medication as prescribed.  If you start any new herbal or new supplements please let us know first to make sure it is safe.  Mouth Care Have teeth cleaned professionally before starting treatment. Keep dentures and partial plates clean. Use soft toothbrush and do not use mouthwashes that contain alcohol. Biotene is a good mouthwash that is available at most pharmacies or may be ordered by calling [phone]. Use warm salt water gargles (1  teaspoon salt per 1 quart warm water) before and after meals and at bedtime. Or you may rinse with 2 tablespoons of three-percent hydrogen peroxide mixed in eight ounces of water. If you are still having problems with your mouth or sores in your mouth please call the clinic. If you need dental work, please let the doctor know before you go for your appointment so that we can coordinate the best possible time for you in regards to your chemo regimen. You need to also let your dentist know that you are actively taking chemo. We may need to do labs prior to your dental appointment.    Skin Care Always use sunscreen that has not expired and with SPF (Sun Protection Factor) of 50 or higher. Wear hats to protect your head from the sun. Remember to use sunscreen on your hands, ears, face, & feet.  Use good moisturizing lotions such as udder cream, eucerin, or even Vaseline. Some chemotherapies can cause dry skin, color changes in your skin and nails.    . Avoid long, hot showers or baths. . Use gentle, fragrance-free soaps and laundry detergent. . Use moisturizers, preferably creams or ointments rather than lotions because the thicker consistency is better at preventing skin dehydration. Apply the cream or ointment within 15 minutes of showering. Reapply moisturizer at night, and moisturize your hands every time after you wash them.  Hair Loss (if your doctor says your hair will fall out)  . If your doctor says that your hair is likely to fall out, decide before you begin chemo whether you want to wear a wig. You may want to shop before treatment to match your hair color. . Hats, turbans, and scarves can also camouflage hair loss, although some people prefer to leave their heads uncovered. If you go bare-headed outdoors, be sure to use sunscreen on your scalp. . Cut your hair short. It eases the inconvenience of shedding lots of hair, but it also can reduce the emotional impact of watching your hair fall  out. . Don't perm or color your hair during chemotherapy. Those chemical treatments are already  damaging to hair and can enhance hair loss. Once your chemo treatments are done and your hair has grown back, it's OK to resume dyeing or perming hair. With chemotherapy, hair loss is almost always temporary. But when it grows back, it may be a different color or texture. In older adults who still had hair color before chemotherapy, the new growth may be completely gray.  Often, new hair is very fine and soft.  Infection Prevention Please wash your hands for at least 30 seconds using warm soapy water. Handwashing is the #1 way to prevent the spread of germs. Stay away from sick people or people who are getting over a cold. If you develop respiratory systems such as green/yellow mucus production or productive cough or persistent cough let us know and we will see if you need an antibiotic. It is a good idea to keep a pair of gloves on when going into grocery stores/[employer] to decrease your risk of coming into contact with germs on the carts, etc. Carry alcohol hand gel with you at all times and use it frequently if out in public. If your temperature reaches 100.5 or higher please call the clinic and let us know.  If it is after hours or on the weekend please go to the ER if your temperature is over 100.5.  Please have your own personal thermometer at home to use.    Sex and bodily fluids If you are going to have sex, a condom must be used to protect the person that isn't taking chemotherapy. Chemo can decrease your libido (sex drive). For a few days after chemotherapy, chemotherapy can be excreted through your bodily fluids.  When using the toilet please close the lid and flush the toilet twice.  Do this for a few day after you have had chemotherapy.     Effects of chemotherapy on your sex life Some changes are simple and won't last long. They won't affect your sex life permanently. Sometimes you may  feel: . too tired . not strong enough to be very active . sick or sore  . not in the mood . anxious or low Your anxiety might not seem related to sex. For example, you may be worried about the cancer and how your treatment is going. Or you may be worried about money, or about how you family are coping with your illness. These things can cause stress, which can affect your interest in sex. It's important to talk to your partner about how you feel. Remember - the changes to your sex life don't usually last long. There's usually no medical reason to stop having sex during chemo. The drugs won't have any long term physical effects on your performance or enjoyment of sex. Cancer can't be passed on to your partner during sex  Contraception It's important to use reliable contraception during treatment. Avoid getting pregnant while you or your partner are having chemotherapy. This is because the drugs may harm the baby. Sometimes chemotherapy drugs can leave a man or woman infertile.  This means you would not be able to have children in the future. You might want to talk to someone about permanent infertility. It can be very difficult to learn that you may no longer be able to have children. Some people find counselling helpful. There might be ways to preserve your fertility, although this is easier for men than for women. You may want to speak to a fertility expert. You can talk about sperm banking or harvesting  your eggs. You can also ask about other fertility options, such as donor eggs. If you have or have had breast cancer, your doctor might advise you not to take the contraceptive pill. This is because the hormones in it might affect the cancer.  It is not known for sure whether or not chemotherapy drugs can be passed on through semen or secretions from the vagina. Because of this some doctors advise people to use a barrier method if you have sex during treatment. This applies to vaginal, anal or oral  sex. Generally, doctors advise a barrier method only for the time you are actually having the treatment and for about a week after your treatment. Advice like this can be worrying, but this does not mean that you have to avoid being intimate with your partner. You can still have close contact with your partner and continue to enjoy sex.  Animals If you have cats or birds we just ask that you not change the litter or change the cage.  Please have someone else do this for you while you are on chemotherapy.   Food Safety During and After Cancer Treatment Food safety is important for people both during and after cancer treatment. Cancer and cancer treatments, such as chemotherapy, radiation therapy, and stem cell/bone marrow transplantation, often weaken the immune system. This makes it harder for your body to protect itself from foodborne illness, also called food poisoning. Foodborne illness is caused by eating food that contains harmful bacteria, parasites, or viruses.   Foods to avoid Some foods have a higher risk of becoming tainted with bacteria. These include: [place] Kitchen Unwashed fresh fruit and vegetables, especially leafy vegetables that can hide dirt and other contaminants . Raw sprouts, such as alfalfa sprouts . Raw or undercooked beef, especially ground beef, or other raw or undercooked meat and poultry . Fatty, fried, or spicy foods immediately before or after treatment.  These can sit heavy on your stomach and make you feel nauseous. . Raw or undercooked shellfish, such as oysters. . Sushi and sashimi, which often contain raw fish.  . Unpasteurized beverages, such as unpasteurized fruit juices, raw milk, raw yogurt, or cider . Undercooked eggs, such as soft boiled, over easy, and poached; raw, unpasteurized eggs; or foods made with raw egg, such as homemade raw cookie dough and homemade mayonnaise Simple steps for food safety Shop smart. . Do not buy food stored or displayed in an unclean  area. . Do not buy bruised or damaged fruits or vegetables. . Do not buy cans that have cracks, dents, or bulges. . Pick up foods that can spoil at the end of your shopping trip and store them in a cooler on the way home. Prepare and clean up foods carefully. . Rinse all fresh fruits and vegetables under running water, and dry them with a clean towel or paper towel. . Clean the top of cans before opening them. . After preparing food, wash your hands for 20 seconds with hot water and soap. Pay special attention to areas between fingers and under nails. . Clean your utensils and dishes with hot water and soap. Marland Kitchen Disinfect your kitchen and cutting boards using 1 teaspoon of liquid, unscented bleach mixed into 1 quart of water.   Dispose of old food. . Eat canned and packaged food before its expiration date (the "use by" or "best before" date). . Consume refrigerated leftovers within 3 to 4 days. After that time, throw out the food. Even if the food  does not smell or look spoiled, it still may be unsafe. Some bacteria, such as Listeria, can grow even on foods stored in the refrigerator if they are kept for too long. Take precautions when eating out. . At restaurants, avoid buffets and salad bars where food sits out for a long time and comes in contact with many people. Food can become contaminated when someone with a virus, often a norovirus, or another "bug" handles it. . Put any leftover food in a "to-go" container yourself, rather than having the server do it. And, refrigerate leftovers as soon as you get home. . Choose restaurants that are clean and that are willing to prepare your food as you order it cooked.              MEDICATIONS: Dexamethasone 4mg  tablet. Take 2 tablets by mouth once a day on the day after chemotherapy and then take 2 tablets two times a day for 2 days. Take with food.                                                                                                     Zofran/Ondansetron 8mg  tablet. Take 1 tablet every 8 hours as needed for nausea/vomiting. (#1 nausea med to take, this can constipate)  Compazine/Prochlorperazine 10mg  tablet. Take 1 tablet every 6 hours as needed for nausea/vomiting. (#2 nausea med to take, this can make you sleepy)  EMLA cream. Apply a quarter size amount to port site 1 hour prior to chemo. Do not rub in. Cover with plastic wrap.   Over-the-Counter Meds:  Miralax 1 capful in 8 oz of fluid daily. May increase to two times a day if needed. This is a stool softener. If this doesn't work proceed you can add:  Senokot S-start with 1 tablet two times a day and increase to 4 tablets two times a day if needed. (total of 8 tablets in a 24 hour period). This is a stimulant laxative.   Call us if this does not help your bowels move.   Imodium 2mg  capsule. Take 2 capsules after the 1st loose stool and then 1 capsule every 2 hours until you go a total of 12 hours without having a loose stool. Call the [facility] if loose stools continue. If diarrhea occurs @ bedtime, take 2 capsules @ bedtime. Then take 2 capsules every 4 hours until morning. Call [facility].     Diarrhea Sheet  If you are having loose stools/diarrhea, please purchase Imodium and begin taking as outlined:  At the first sign of poorly formed or loose stools you should begin taking Imodium(loperamide) 2 mg capsules.  Take two caplets (4mg ) followed by one caplet (2mg ) every 2 hours until you have had no diarrhea for 12 hours.  During the night take two caplets (4mg ) at bedtime and continue every 4 hours during the night until the morning.  Stop taking Imodium only after there is no sign of diarrhea for 12 hours.    Always call the [facility] if you are having loose stools/diarrhea that you can't get under control.  Loose stools/disrrhea leads to dehydration (loss of water) in your body.  We have other options of trying to get the loose stools/diarrhea to  stopped but you must let us know!             Constipation Sheet *Miralax in 8 oz of fluid daily.  May increase to two times a day if needed.  This is a stool softener.  If this not enough to keep your bowel regular:  You can add:  *Senokot S, start with one tablet twice a day and can increase to 4 tablets twice a day if needed.  This is a stimulant laxative.   Sometimes when you take pain medication you need BOTH a medicine to keep your stool soft and a medicine to help your bowel push it out!  Please call if the above does not work for you.   Do not go more than 2 days without a bowel movement.  It is very important that you do not become constipated.  It will make you feel sick to your stomach (nausea) and can cause abdominal pain and vomiting.              Nausea Sheet  Zofran/Ondansetron 8mg  tablet. Take 1 tablet every 8 hours as needed for nausea/vomiting. (#1 nausea med to take, this can constipate)  Compazine/Prochlorperazine 10mg  tablet. Take 1 tablet every 6 hours as needed for nausea/vomiting. (#2 nausea med to take, this can make you sleepy)  You can take these medications together or separately.  We would first like for you to try the Ondansetron by itself and then take the Prochloperizine if needed. But you are allowed to take both medications at the same time if your nausea is that severe.  If you are having persistent nausea (nausea that does not stop) please take these medications on a staggered schedule so that the nausea medication stays in your body.  Please call the [facility] and let us know the amount of nausea that you are experiencing.  If you begin to vomit, you need to call the [facility] and if it is the weekend and you have vomited more than one time and cant get it to stop-go to the Emergency Room.  Persistent nausea/vomiting can lead to dehydration (loss of fluid in your body) and will make you feel terrible.   Ice chips, sips of  clear liquids, foods that are @ room temperature, crackers, and toast tend to be better tolerated.         SYMPTOMS TO REPORT AS SOON AS POSSIBLE AFTER TREATMENT:  FEVER GREATER THAN 100.5 F  CHILLS WITH OR WITHOUT FEVER  NAUSEA AND VOMITING THAT IS NOT CONTROLLED WITH YOUR NAUSEA MEDICATION  UNUSUAL SHORTNESS OF BREATH  UNUSUAL BRUISING OR BLEEDING  TENDERNESS IN MOUTH AND THROAT WITH OR WITHOUT PRESENCE OF ULCERS  URINARY PROBLEMS  BOWEL PROBLEMS  UNUSUAL RASH     Wear comfortable clothing and clothing appropriate for easy access to any Portacath or PICC line. Let us know if there is anything that we can do to make your therapy better!      What to do if you need assistance after hours or on the weekends: CALL [phone].  HOLD on the line, do not hang up.  You will hear multiple messages but at the end you will be connected with a nurse triage line.  They will contact the doctor if necessary.  Most of the time they will be able to assist you.  Do not call the hospital operator.       I have been informed and understand all of the instructions given to me and have received a copy. I have been instructed to call the clinic [phone] or my family physician as soon as possible for continued medical care, if indicated. I do not have any more questions at this time but understand that I may call the [facility] or the Patient Navigator at [phone] during office hours should I have questions or need assistance in obtaining follow-up care.

## 2017-10-10 ENCOUNTER — Encounter (HOSPITAL_COMMUNITY)
Admission: RE | Admit: 2017-10-10 | Discharge: 2017-10-10 | Disposition: A | Discharge status: Home / Self Care | Payer: Medicare Other | Source: Ambulatory Visit | Attending: General Surgery | Admitting: General Surgery

## 2017-10-10 ENCOUNTER — Encounter (HOSPITAL_COMMUNITY): Payer: Medicare Other

## 2017-10-10 ENCOUNTER — Encounter (HOSPITAL_COMMUNITY): Payer: Self-pay

## 2017-10-10 ENCOUNTER — Ambulatory Visit (HOSPITAL_COMMUNITY)
Admission: RE | Admit: 2017-10-10 | Discharge: 2017-10-10 | Disposition: A | Discharge status: Home / Self Care | Payer: Medicare Other | Source: Ambulatory Visit | Attending: Oncology | Admitting: Oncology

## 2017-10-10 DIAGNOSIS — K219 Gastro-esophageal reflux disease without esophagitis: Secondary | ICD-10-CM | POA: Insufficient documentation

## 2017-10-10 DIAGNOSIS — C50412 Malignant neoplasm of upper-outer quadrant of left female breast: Secondary | ICD-10-CM | POA: Diagnosis present

## 2017-10-10 DIAGNOSIS — Z9012 Acquired absence of left breast and nipple: Secondary | ICD-10-CM | POA: Insufficient documentation

## 2017-10-10 DIAGNOSIS — E7800 Pure hypercholesterolemia, unspecified: Secondary | ICD-10-CM | POA: Insufficient documentation

## 2017-10-10 DIAGNOSIS — I10 Essential (primary) hypertension: Secondary | ICD-10-CM | POA: Insufficient documentation

## 2017-10-10 DIAGNOSIS — Z171 Estrogen receptor negative status [ER-]: Secondary | ICD-10-CM

## 2017-10-10 LAB — ECHOCARDIOGRAM COMPLETE
AVLVOTPG: 5 mmHg
CHL CUP DOP CALC LVOT VTI: 21.1 cm
CHL CUP MV DEC (S): 327
CHL CUP STROKE VOLUME: 46 mL
E decel time: 327 msec
E/e' ratio: 10.58
FS: 26 % — AB (ref 28–44)
IVS/LV PW RATIO, ED: 0.99
LA ID, A-P, ES: 37 mm
LA diam index: 1.49 cm/m2
LA vol A4C: 51.5 ml
LA vol index: 19.7 mL/m2
LA vol: 49.1 mL
LDCA: 3.8 cm2
LEFT ATRIUM END SYS DIAM: 37 mm
LV PW d: 12.8 mm — AB (ref 0.6–1.1)
LV SIMPSON'S DISK: 68
LV TDI E'MEDIAL: 6.42
LV dias vol index: 27 mL/m2
LV dias vol: 68 mL (ref 46–106)
LV e' LATERAL: 7.62 cm/s
LV sys vol index: 9 mL/m2
LVEEAVG: 10.58
LVEEMED: 10.58
LVOT peak vel: 114 cm/s
LVOTD: 22 mm
LVOTSV: 80 mL
LVSYSVOL: 22 mL
MV Peak grad: 3 mmHg
MV pk A vel: 104 m/s
MVPKEVEL: 80.6 m/s
RV LATERAL S' VELOCITY: 11.5 cm/s
TAPSE: 15.6 mm
TDI e' lateral: 7.62

## 2017-10-10 NOTE — Progress Notes (Signed)
*  PRELIMINARY RESULTS* Echocardiogram 2D Echocardiogram has been performed.  Kelli Wilson 10/10/2017, 1:54 PM

## 2017-10-13 ENCOUNTER — Ambulatory Visit (HOSPITAL_COMMUNITY): Payer: Medicare Other

## 2017-10-13 ENCOUNTER — Ambulatory Visit (HOSPITAL_COMMUNITY): Payer: Medicare Other | Admitting: Anesthesiology

## 2017-10-13 ENCOUNTER — Ambulatory Visit (HOSPITAL_COMMUNITY)
Admission: RE | Admit: 2017-10-13 | Discharge: 2017-10-13 | Disposition: A | Discharge status: Home / Self Care | Payer: Medicare Other | Source: Ambulatory Visit | Attending: General Surgery | Admitting: General Surgery

## 2017-10-13 ENCOUNTER — Encounter (HOSPITAL_COMMUNITY): Payer: Self-pay | Admitting: *Deleted

## 2017-10-13 ENCOUNTER — Encounter (HOSPITAL_COMMUNITY)
Admission: RE | Disposition: A | Discharge status: Home / Self Care | Payer: Self-pay | Source: Ambulatory Visit | Attending: General Surgery

## 2017-10-13 DIAGNOSIS — Z79899 Other long term (current) drug therapy: Secondary | ICD-10-CM | POA: Diagnosis not present

## 2017-10-13 DIAGNOSIS — E7800 Pure hypercholesterolemia, unspecified: Secondary | ICD-10-CM | POA: Diagnosis not present

## 2017-10-13 DIAGNOSIS — J449 Chronic obstructive pulmonary disease, unspecified: Secondary | ICD-10-CM | POA: Insufficient documentation

## 2017-10-13 DIAGNOSIS — C50412 Malignant neoplasm of upper-outer quadrant of left female breast: Secondary | ICD-10-CM

## 2017-10-13 DIAGNOSIS — E119 Type 2 diabetes mellitus without complications: Secondary | ICD-10-CM | POA: Insufficient documentation

## 2017-10-13 DIAGNOSIS — Z8711 Personal history of peptic ulcer disease: Secondary | ICD-10-CM | POA: Insufficient documentation

## 2017-10-13 DIAGNOSIS — M797 Fibromyalgia: Secondary | ICD-10-CM | POA: Insufficient documentation

## 2017-10-13 DIAGNOSIS — Z720 Tobacco use: Secondary | ICD-10-CM | POA: Insufficient documentation

## 2017-10-13 DIAGNOSIS — F329 Major depressive disorder, single episode, unspecified: Secondary | ICD-10-CM | POA: Insufficient documentation

## 2017-10-13 DIAGNOSIS — C50912 Malignant neoplasm of unspecified site of left female breast: Secondary | ICD-10-CM | POA: Diagnosis present

## 2017-10-13 DIAGNOSIS — I10 Essential (primary) hypertension: Secondary | ICD-10-CM | POA: Diagnosis not present

## 2017-10-13 DIAGNOSIS — Z95828 Presence of other vascular implants and grafts: Secondary | ICD-10-CM

## 2017-10-13 DIAGNOSIS — F419 Anxiety disorder, unspecified: Secondary | ICD-10-CM | POA: Diagnosis not present

## 2017-10-13 DIAGNOSIS — K219 Gastro-esophageal reflux disease without esophagitis: Secondary | ICD-10-CM | POA: Insufficient documentation

## 2017-10-13 HISTORY — PX: PORTACATH PLACEMENT: SHX2246

## 2017-10-13 LAB — GLUCOSE, CAPILLARY: Glucose-Capillary: 119 mg/dL — ABNORMAL HIGH (ref 65–99)

## 2017-10-13 SURGERY — INSERTION, TUNNELED CENTRAL VENOUS DEVICE, WITH PORT
Anesthesia: Monitor Anesthesia Care | Laterality: Right

## 2017-10-13 MED ORDER — HEPARIN SOD (PORK) LOCK FLUSH 100 UNIT/ML IV SOLN
INTRAVENOUS | Status: AC
  Filled 2017-10-13: qty 5

## 2017-10-13 MED ORDER — LIDOCAINE HCL (PF) 1 % IJ SOLN
INTRAMUSCULAR | Status: AC
  Filled 2017-10-13: qty 30

## 2017-10-13 MED ORDER — MIDAZOLAM HCL 2 MG/2ML IJ SOLN
1.0000 mg | INTRAMUSCULAR | Status: AC
  Administered 2017-10-13: 2 mg via INTRAVENOUS

## 2017-10-13 MED ORDER — CHLORHEXIDINE GLUCONATE CLOTH 2 % EX PADS: 6.0000 | MEDICATED_PAD | Freq: Once | CUTANEOUS | Status: DC

## 2017-10-13 MED ORDER — LIDOCAINE HCL (PF) 1 % IJ SOLN
INTRAMUSCULAR | Status: DC | PRN
  Administered 2017-10-13: 10 mL

## 2017-10-13 MED ORDER — LIDOCAINE HCL (PF) 1 % IJ SOLN
INTRAMUSCULAR | Status: AC
  Filled 2017-10-13: qty 5

## 2017-10-13 MED ORDER — FENTANYL CITRATE (PF) 100 MCG/2ML IJ SOLN
INTRAMUSCULAR | Status: AC
  Filled 2017-10-13: qty 2

## 2017-10-13 MED ORDER — HYDROCODONE-ACETAMINOPHEN 5-325 MG PO TABS: 1.0000 | ORAL_TABLET | Freq: Four times a day (QID) | ORAL | 0 refills | Status: DC | PRN

## 2017-10-13 MED ORDER — LIDOCAINE HCL (PF) 1 % IJ SOLN
INTRAMUSCULAR | Status: DC | PRN
  Administered 2017-10-13: 40 mg

## 2017-10-13 MED ORDER — LACTATED RINGERS IV SOLN
INTRAVENOUS | Status: DC
  Administered 2017-10-13: 11:00:00 via INTRAVENOUS

## 2017-10-13 MED ORDER — FENTANYL CITRATE (PF) 100 MCG/2ML IJ SOLN
INTRAMUSCULAR | Status: DC | PRN
  Administered 2017-10-13: 50 ug via INTRAVENOUS

## 2017-10-13 MED ORDER — CEFAZOLIN SODIUM-DEXTROSE 2-4 GM/100ML-% IV SOLN
INTRAVENOUS | Status: AC
  Filled 2017-10-13: qty 100

## 2017-10-13 MED ORDER — MIDAZOLAM HCL 2 MG/2ML IJ SOLN
INTRAMUSCULAR | Status: AC
  Filled 2017-10-13: qty 2

## 2017-10-13 MED ORDER — PROPOFOL 10 MG/ML IV BOLUS
INTRAVENOUS | Status: AC
  Filled 2017-10-13: qty 20

## 2017-10-13 MED ORDER — HEPARIN SOD (PORK) LOCK FLUSH 100 UNIT/ML IV SOLN
INTRAVENOUS | Status: DC | PRN
  Administered 2017-10-13: 500 [IU]

## 2017-10-13 MED ORDER — FENTANYL CITRATE (PF) 100 MCG/2ML IJ SOLN
25.0000 ug | INTRAMUSCULAR | Status: DC | PRN
  Administered 2017-10-13 (×2): 50 ug via INTRAVENOUS

## 2017-10-13 MED ORDER — FENTANYL CITRATE (PF) 100 MCG/2ML IJ SOLN
25.0000 ug | Freq: Once | INTRAMUSCULAR | Status: AC
  Administered 2017-10-13: 25 ug via INTRAVENOUS

## 2017-10-13 MED ORDER — SODIUM CHLORIDE 0.9 % IV SOLN
INTRAVENOUS | Status: AC | PRN
Start: 2017-10-13 — End: 2017-10-13
  Administered 2017-10-13: 5 mL

## 2017-10-13 MED ORDER — CEFAZOLIN SODIUM-DEXTROSE 2-4 GM/100ML-% IV SOLN
2.0000 g | INTRAVENOUS | Status: AC
  Administered 2017-10-13: 2 g via INTRAVENOUS

## 2017-10-13 MED ORDER — PROPOFOL 500 MG/50ML IV EMUL
INTRAVENOUS | Status: DC | PRN
  Administered 2017-10-13: 150 ug/kg/min via INTRAVENOUS

## 2017-10-13 SURGICAL SUPPLY — 30 items
ADH SKN CLS APL DERMABOND .7 (GAUZE/BANDAGES/DRESSINGS) ×1
BAG DECANTER FOR FLEXI CONT (MISCELLANEOUS) ×3 IMPLANT
BAG HAMPER (MISCELLANEOUS) ×3 IMPLANT
CHLORAPREP W/TINT 10.5 ML (MISCELLANEOUS) ×3 IMPLANT
CLOTH BEACON ORANGE TIMEOUT ST (SAFETY) ×3 IMPLANT
COVER LIGHT HANDLE STERIS (MISCELLANEOUS) ×6 IMPLANT
DECANTER SPIKE VIAL GLASS SM (MISCELLANEOUS) ×3 IMPLANT
DERMABOND ADVANCED (GAUZE/BANDAGES/DRESSINGS) ×2
DERMABOND ADVANCED .7 DNX12 (GAUZE/BANDAGES/DRESSINGS) ×1 IMPLANT
DRAPE C-ARM FOLDED MOBILE STRL (DRAPES) ×3 IMPLANT
ELECT REM PT RETURN 9FT ADLT (ELECTROSURGICAL) ×3
ELECTRODE REM PT RTRN 9FT ADLT (ELECTROSURGICAL) ×1 IMPLANT
GLOVE BIOGEL PI IND STRL 7.0 (GLOVE) ×1 IMPLANT
GLOVE BIOGEL PI INDICATOR 7.0 (GLOVE) ×2
GLOVE SURG SS PI 7.5 STRL IVOR (GLOVE) ×3 IMPLANT
GOWN STRL REUS W/TWL LRG LVL3 (GOWN DISPOSABLE) ×6 IMPLANT
IV NS 500ML (IV SOLUTION) ×3
IV NS 500ML BAXH (IV SOLUTION) ×1 IMPLANT
KIT PORT POWER 8FR ISP MRI (Port) ×3 IMPLANT
KIT ROOM TURNOVER APOR (KITS) ×3 IMPLANT
MANIFOLD NEPTUNE II (INSTRUMENTS) ×3 IMPLANT
NEEDLE HYPO 25X1 1.5 SAFETY (NEEDLE) ×3 IMPLANT
PACK MINOR (CUSTOM PROCEDURE TRAY) ×3 IMPLANT
PAD ARMBOARD 7.5X6 YLW CONV (MISCELLANEOUS) ×3 IMPLANT
SET BASIN LINEN APH (SET/KITS/TRAYS/PACK) ×3 IMPLANT
SUT VIC AB 3-0 SH 27 (SUTURE) ×3
SUT VIC AB 3-0 SH 27X BRD (SUTURE) ×1 IMPLANT
SUT VIC AB 4-0 PS2 27 (SUTURE) ×3 IMPLANT
SYR 20CC LL (SYRINGE) ×3 IMPLANT
SYR CONTROL 10ML LL (SYRINGE) ×3 IMPLANT

## 2017-10-13 NOTE — Discharge Instructions (Signed)
Implanted Port Home Guide °An implanted port is a type of central line that is placed under the skin. Central lines are used to provide IV access when treatment or nutrition needs to be given through a person’s veins. Implanted ports are used for long-term IV access. An implanted port may be placed because: °· You need IV medicine that would be irritating to the small veins in your hands or arms. °· You need long-term IV medicines, such as antibiotics. °· You need IV nutrition for a long period. °· You need frequent blood draws for lab tests. °· You need dialysis. ° °Implanted ports are usually placed in the chest area, but they can also be placed in the upper arm, the abdomen, or the leg. An implanted port has two main parts: °· Reservoir. The reservoir is round and will appear as a small, raised area under your skin. The reservoir is the part where a needle is inserted to give medicines or draw blood. °· Catheter. The catheter is a thin, flexible tube that extends from the reservoir. The catheter is placed into a large vein. Medicine that is inserted into the reservoir goes into the catheter and then into the vein. ° °How will I care for my incision site? °Do not get the incision site wet. Bathe or shower as directed by your health care provider. °How is my port accessed? °Special steps must be taken to access the port: °· Before the port is accessed, a numbing cream can be placed on the skin. This helps numb the skin over the port site. °· Your health care provider uses a sterile technique to access the port. °? Your health care provider must put on a mask and sterile gloves. °? The skin over your port is cleaned carefully with an antiseptic and allowed to dry. °? The port is gently pinched between sterile gloves, and a needle is inserted into the port. °· Only "non-coring" port needles should be used to access the port. Once the port is accessed, a blood return should be checked. This helps ensure that the port  is in the vein and is not clogged. °· If your port needs to remain accessed for a constant infusion, a clear (transparent) bandage will be placed over the needle site. The bandage and needle will need to be changed every week, or as directed by your health care provider. °· Keep the bandage covering the needle clean and dry. Do not get it wet. Follow your health care provider’s instructions on how to take a shower or bath while the port is accessed. °· If your port does not need to stay accessed, no bandage is needed over the port. ° °What is flushing? °Flushing helps keep the port from getting clogged. Follow your health care provider’s instructions on how and when to flush the port. Ports are usually flushed with saline solution or a medicine called heparin. The need for flushing will depend on how the port is used. °· If the port is used for intermittent medicines or blood draws, the port will need to be flushed: °? After medicines have been given. °? After blood has been drawn. °? As part of routine maintenance. °· If a constant infusion is running, the port may not need to be flushed. ° °How long will my port stay implanted? °The port can stay in for as long as your health care provider thinks it is needed. When it is time for the port to come out, surgery will be   done to remove it. The procedure is similar to the one performed when the port was put in. °When should I seek immediate medical care? °When you have an implanted port, you should seek immediate medical care if: °· You notice a bad smell coming from the incision site. °· You have swelling, redness, or drainage at the incision site. °· You have more swelling or pain at the port site or the surrounding area. °· You have a fever that is not controlled with medicine. ° °This information is not intended to replace advice given to you by your health care provider. Make sure you discuss any questions you have with your health care provider. °Document  Released: 11/11/2005 Document Revised: 04/18/2016 Document Reviewed: 07/19/2013 °Elsevier Interactive Patient Education © 2017 [employer] °Implanted Port Insertion, Care After °This sheet gives you information about how to care for yourself after your procedure. Your health care provider may also give you more specific instructions. If you have problems or questions, contact your health care provider. °What can I expect after the procedure? °After your procedure, it is common to have: °· Discomfort at the port insertion site. °· Bruising on the skin over the port. This should improve over 3-4 days. ° °Follow these instructions at home: °Port care °· After your port is placed, you will get a manufacturer's information card. The card has information about your port. Keep this card with you at all times. °· Take care of the port as told by your health care provider. Ask your health care provider if you or a family member can get training for taking care of the port at home. A home health care nurse may also take care of the port. °· Make sure to remember what type of port you have. °Incision care °· Follow instructions from your health care provider about how to take care of your port insertion site. Make sure you: °? Wash your hands with soap and water before you change your bandage (dressing). If soap and water are not available, use hand sanitizer. °? Change your dressing as told by your health care provider. °? Leave stitches (sutures), skin glue, or adhesive strips in place. These skin closures may need to stay in place for 2 weeks or longer. If adhesive strip edges start to loosen and curl up, you may trim the loose edges. Do not remove adhesive strips completely unless your health care provider tells you to do that. °· Check your port insertion site every day for signs of infection. Check for: °? More redness, swelling, or pain. °? More fluid or blood. °? Warmth. °? Pus or a bad smell. °General  instructions °· Do not take baths, swim, or use a hot tub until your health care provider approves. °· Do not lift anything that is heavier than 10 lb (4.5 kg) for a week, or as told by your health care provider. °· Ask your health care provider when it is okay to: °? Return to work or school. °? Resume usual physical activities or sports. °· Do not drive for 24 hours if you were given a medicine to help you relax (sedative). °· Take over-the-counter and prescription medicines only as told by your health care provider. °· Wear a medical alert bracelet in case of an emergency. This will tell any health care providers that you have a port. °· Keep all follow-up visits as told by your health care provider. This is important. °Contact a health care provider if: °· You cannot   flush your port with saline as directed, or you cannot draw blood from the port. °· You have a fever or chills. °· You have more redness, swelling, or pain around your port insertion site. °· You have more fluid or blood coming from your port insertion site. °· Your port insertion site feels warm to the touch. °· You have pus or a bad smell coming from the port insertion site. °Get help right away if: °· You have chest pain or shortness of breath. °· You have bleeding from your port that you cannot control. °Summary °· Take care of the port as told by your health care provider. °· Change your dressing as told by your health care provider. °· Keep all follow-up visits as told by your health care provider. °This information is not intended to replace advice given to you by your health care provider. Make sure you discuss any questions you have with your health care provider. °Document Released: 09/01/2013 Document Revised: 10/02/2016 Document Reviewed: 10/02/2016 °Elsevier Interactive Patient Education © 2017 [employer] ° °

## 2017-10-13 NOTE — Progress Notes (Addendum)
Dr. Arnoldo Morale notified of pt's R shoulder pain. Pt has good strength and movement in bilateral upper extremities. Pt encouraged to use ice pack and prescribed medication.

## 2017-10-13 NOTE — Transfer of Care (Signed)
Immediate Anesthesia Transfer of Care Note  Patient: Kelli Wilson  Procedure(s) Performed: INSERTION PORT-A-CATH RIGHT SUBCLAVIAN (Right )  Patient Location: PACU  Anesthesia Type:MAC  Level of Consciousness: awake, alert , oriented and patient cooperative  Airway & Oxygen Therapy: Patient Spontanous Breathing and Patient connected to nasal cannula oxygen  Post-op Assessment: Report given to RN and Post -op Vital signs reviewed and stable  Post vital signs: Reviewed and stable  Last Vitals: There were no vitals filed for this visit.  Last Pain: There were no vitals filed for this visit.       Complications: No apparent anesthesia complications

## 2017-10-13 NOTE — Anesthesia Postprocedure Evaluation (Signed)
Anesthesia Post Note  Patient: Kelli Wilson  Procedure(s) Performed: INSERTION PORT-A-CATH RIGHT SUBCLAVIAN (Right )  Patient location during evaluation: PACU Anesthesia Type: MAC Level of consciousness: awake and alert Pain management: satisfactory to patient Vital Signs Assessment: post-procedure vital signs reviewed and stable Respiratory status: spontaneous breathing Cardiovascular status: stable Postop Assessment: no apparent nausea or vomiting Anesthetic complications: no     Last Vitals:  Vitals:   10/13/17 1242  BP: (!) 166/81  Pulse: 80  Resp: 20  Temp: 36.8 C  SpO2: 100%    Last Pain:  Vitals:   10/13/17 1242  PainSc: 7                  Margareta Laureano

## 2017-10-13 NOTE — Anesthesia Preprocedure Evaluation (Signed)
Anesthesia Evaluation  Patient identified by MRN, date of birth, ID band Patient awake    History of Anesthesia Complications (+) PROLONGED EMERGENCE  Airway Mallampati: I  TM Distance: >3 FB     Dental  (+) Teeth Intact, Poor Dentition   Pulmonary asthma (stable) , COPD,  COPD inhaler,    Pulmonary exam normal        Cardiovascular hypertension, Pt. on medications Normal cardiovascular exam Rhythm:Regular Rate:Normal  EF 55 - 60%   Neuro/Psych Anxiety Depression    GI/Hepatic PUD, GERD  Controlled,  Endo/Other  diabetes, Well Controlled  Renal/GU      Musculoskeletal  (+) Fibromyalgia -  Abdominal (+)  Abdomen: soft.    Peds  Hematology   Anesthesia Other Findings   Reproductive/Obstetrics                             Anesthesia Physical Anesthesia Plan  ASA: III  Anesthesia Plan: MAC   Post-op Pain Management:    Induction: Intravenous  PONV Risk Score and Plan:   Airway Management Planned: Simple Face Mask  Additional Equipment:   Intra-op Plan:   Post-operative Plan:   Informed Consent: I have reviewed the patients History and Physical, chart, labs and discussed the procedure including the risks, benefits and alternatives for the proposed anesthesia with the patient or authorized representative who has indicated his/her understanding and acceptance.     Plan Discussed with:   Anesthesia Plan Comments:         Anesthesia Quick Evaluation

## 2017-10-13 NOTE — Interval H&P Note (Signed)
History and Physical Interval Note:  10/13/2017 11:27 AM  Kelli Wilson  has presented today for surgery, with the diagnosis of left breast cancer  The various methods of treatment have been discussed with the patient and family. After consideration of risks, benefits and other options for treatment, the patient has consented to  Procedure(s): INSERTION PORT-A-CATH (Right) as a surgical intervention .  The patient's history has been reviewed, patient examined, no change in status, stable for surgery.  I have reviewed the patient's chart and labs.  Questions were answered to the patient's satisfaction.     Aviva Signs

## 2017-10-13 NOTE — Op Note (Signed)
Patient:  Kelli Wilson  DOB:  Jul 02, 1951  MRN:  [id number]   Preop Diagnosis: Left breast cancer, need for central venous access  Postop Diagnosis: Same  Procedure: Port-A-Cath insertion  Surgeon: Aviva Signs, MD  Anes: MAC  Indications: Patient is a 66 year old white female recently diagnosed with left breast carcinoma who is about to undergo chemotherapy.  The risks and benefits of the procedure including bleeding, infection, and pneumothorax were fully explained to the patient, who gave informed consent.  Procedure note: The patient was placed in the Trendelenburg position after the right upper chest was prepped and draped using the usual sterile technique with DuraPrep.  Surgical site confirmation was performed.  1% Xylocaine was used for local anesthesia.  An incision was made below the right clavicle.  Subcutaneous pocket was formed.  The needle was advanced into the right subclavian vein using the Seldinger technique without difficulty.  The guidewire was then advanced into the right atrium under fluoroscopic guidance.  An introducer and peel-away sheath were placed over the guidewire.  The catheter was then inserted through the peel-away sheath and the peel-away sheath was removed.  The catheter was then attached to the port and the port placed in subcutaneous pocket.  Adequate positioning was confirmed by fluoroscopy.  Good backflow of blood was noted on aspiration of the port.  The port was flushed with heparin flush.  Subcutaneous layer was reapproximated using a 3-0 Vicryl interrupted suture.  The skin was closed using a 4-0 Vicryl subcuticular suture.  Dermabond was applied.  All tape needle counts were correct at the end of the procedure.  The patient was transferred to PACU in stable condition.  A chest x-ray will be performed at that time.  Complications: None  EBL: Minimal  Specimen: None

## 2017-10-14 ENCOUNTER — Encounter (HOSPITAL_COMMUNITY): Payer: Self-pay | Admitting: General Surgery

## 2017-10-20 ENCOUNTER — Encounter (HOSPITAL_BASED_OUTPATIENT_CLINIC_OR_DEPARTMENT_OTHER): Payer: Medicare Other

## 2017-10-20 ENCOUNTER — Other Ambulatory Visit: Payer: Self-pay

## 2017-10-20 ENCOUNTER — Encounter (HOSPITAL_COMMUNITY): Payer: Self-pay

## 2017-10-20 VITALS — BP 180/70 | HR 83 | Temp 98.6°F | Resp 18 | Wt 275.3 lb

## 2017-10-20 DIAGNOSIS — Z5111 Encounter for antineoplastic chemotherapy: Secondary | ICD-10-CM

## 2017-10-20 DIAGNOSIS — C50412 Malignant neoplasm of upper-outer quadrant of left female breast: Secondary | ICD-10-CM | POA: Diagnosis not present

## 2017-10-20 DIAGNOSIS — Z5189 Encounter for other specified aftercare: Secondary | ICD-10-CM | POA: Diagnosis not present

## 2017-10-20 DIAGNOSIS — Z171 Estrogen receptor negative status [ER-]: Principal | ICD-10-CM

## 2017-10-20 LAB — CBC WITH DIFFERENTIAL/PLATELET
Basophils Absolute: 0 10*3/uL (ref 0.0–0.1)
Basophils Relative: 1 %
EOS PCT: 5 %
Eosinophils Absolute: 0.4 10*3/uL (ref 0.0–0.7)
HCT: 39 % (ref 36.0–46.0)
HEMOGLOBIN: 12.6 g/dL (ref 12.0–15.0)
LYMPHS ABS: 3.9 10*3/uL (ref 0.7–4.0)
LYMPHS PCT: 48 %
MCH: 28.6 pg (ref 26.0–34.0)
MCHC: 32.3 g/dL (ref 30.0–36.0)
MCV: 88.4 fL (ref 78.0–100.0)
Monocytes Absolute: 0.7 10*3/uL (ref 0.1–1.0)
Monocytes Relative: 8 %
NEUTROS PCT: 38 %
Neutro Abs: 3.1 10*3/uL (ref 1.7–7.7)
Platelets: 222 10*3/uL (ref 150–400)
RBC: 4.41 MIL/uL (ref 3.87–5.11)
RDW: 13.8 % (ref 11.5–15.5)
WBC: 8.2 10*3/uL (ref 4.0–10.5)

## 2017-10-20 LAB — COMPREHENSIVE METABOLIC PANEL
ALBUMIN: 3.7 g/dL (ref 3.5–5.0)
ALT: 15 U/L (ref 14–54)
AST: 20 U/L (ref 15–41)
Alkaline Phosphatase: 87 U/L (ref 38–126)
Anion gap: 10 (ref 5–15)
BUN: 9 mg/dL (ref 6–20)
CHLORIDE: 101 mmol/L (ref 101–111)
CO2: 25 mmol/L (ref 22–32)
Calcium: 8.9 mg/dL (ref 8.9–10.3)
Creatinine, Ser: 0.92 mg/dL (ref 0.44–1.00)
GFR calc Af Amer: >60 mL/min (ref >60)
GFR calc non Af Amer: >60 mL/min (ref >60)
Glucose, Bld: 131 mg/dL — ABNORMAL HIGH (ref 65–99)
POTASSIUM: 3.6 mmol/L (ref 3.5–5.1)
SODIUM: 136 mmol/L (ref 135–145)
Total Bilirubin: 0.4 mg/dL (ref 0.3–1.2)
Total Protein: 7.1 g/dL (ref 6.5–8.1)

## 2017-10-20 MED ORDER — SODIUM CHLORIDE 0.9 % IV SOLN
Freq: Once | INTRAVENOUS | Status: AC
  Administered 2017-10-20: 10:00:00 via INTRAVENOUS
  Filled 2017-10-20: qty 5

## 2017-10-20 MED ORDER — HEPARIN SOD (PORK) LOCK FLUSH 100 UNIT/ML IV SOLN
500.0000 [IU] | Freq: Once | INTRAVENOUS | Status: AC | PRN
  Administered 2017-10-20: 500 [IU]
  Filled 2017-10-20 (×2): qty 5

## 2017-10-20 MED ORDER — SODIUM CHLORIDE 0.9% FLUSH
10.0000 mL | INTRAVENOUS | Status: DC | PRN
  Administered 2017-10-20: 10 mL
  Filled 2017-10-20: qty 10

## 2017-10-20 MED ORDER — PEGFILGRASTIM 6 MG/0.6ML ~~LOC~~ PSKT
6.0000 mg | PREFILLED_SYRINGE | Freq: Once | SUBCUTANEOUS | Status: AC
  Administered 2017-10-20: 6 mg via SUBCUTANEOUS
  Filled 2017-10-20: qty 0.6

## 2017-10-20 MED ORDER — DOXORUBICIN HCL CHEMO IV INJECTION 2 MG/ML
60.0000 mg/m2 | Freq: Once | INTRAVENOUS | Status: AC
  Administered 2017-10-20: 148 mg via INTRAVENOUS
  Filled 2017-10-20: qty 74

## 2017-10-20 MED ORDER — SODIUM CHLORIDE 0.9 % IV SOLN
Freq: Once | INTRAVENOUS | Status: AC
  Administered 2017-10-20: 10:00:00 via INTRAVENOUS

## 2017-10-20 MED ORDER — CYCLOPHOSPHAMIDE CHEMO INJECTION 1 GM
610.0000 mg/m2 | Freq: Once | INTRAMUSCULAR | Status: AC
  Administered 2017-10-20: 1500 mg via INTRAVENOUS
  Filled 2017-10-20: qty 50

## 2017-10-20 MED ORDER — PALONOSETRON HCL INJECTION 0.25 MG/5ML
0.2500 mg | Freq: Once | INTRAVENOUS | Status: AC
  Administered 2017-10-20: 0.25 mg via INTRAVENOUS
  Filled 2017-10-20: qty 5

## 2017-10-20 NOTE — Progress Notes (Signed)
Labs reviewed with MD. Proceed with treatment.   Kelli KitchenTonye Pearson Wilson arrived today for ONPRO neulasta on body injector. See MAR for administration details. Injector in place and engaged with green light indicator on flashing. Tolerated application with out problems.   Treatment given per orders. Patient tolerated it well without problems. Vitals stable and discharged home from clinic ambulatory. Follow up as scheduled.

## 2017-10-20 NOTE — Patient Instructions (Signed)
[facility] Discharge Instructions for Patients Receiving Chemotherapy   Beginning January 23rd 2017 lab work for the Cancer Center will be done in the  Main lab at  on 1st floor. If you have a lab appointment with the Cancer Center please come in thru the  Main Entrance and check in at the main information desk   Today you received the following chemotherapy agents   To help prevent nausea and vomiting after your treatment, we encourage you to take your nausea medication     If you develop nausea and vomiting, or diarrhea that is not controlled by your medication, call the clinic.  The clinic phone number is [phone]. Office hours are Monday-Friday 8:30am-5:00pm.  BELOW ARE SYMPTOMS THAT SHOULD BE REPORTED IMMEDIATELY:  *FEVER GREATER THAN 101.0 F  *CHILLS WITH OR WITHOUT FEVER  NAUSEA AND VOMITING THAT IS NOT CONTROLLED WITH YOUR NAUSEA MEDICATION  *UNUSUAL SHORTNESS OF BREATH  *UNUSUAL BRUISING OR BLEEDING  TENDERNESS IN MOUTH AND THROAT WITH OR WITHOUT PRESENCE OF ULCERS  *URINARY PROBLEMS  *BOWEL PROBLEMS  UNUSUAL RASH Items with * indicate a potential emergency and should be followed up as soon as possible. If you have an emergency after office hours please contact your primary care physician or go to the nearest emergency department.  Please call the clinic during office hours if you have any questions or concerns.   You may also contact the Patient Navigator at [phone] should you have any questions or need assistance in obtaining follow up care.      Resources For Cancer Patients and their Caregivers ? [employer]: Can assist with transportation, wigs, general needs, runs Look Good Feel Better.        1-888-227-6333 ? Cancer Care: Provides financial assistance, online support groups, medication/co-pay assistance.  1-800-813-HOPE (4673) ? [facility] Assists [place] cancer  patients and their families through emotional , educational and financial support.  336-427-4357 ? [place] DSS Where to apply for food stamps, Medicaid and utility assistance. 336-342-1394 ? [facility]: Transportation to medical appointments. 336-347-2287 ? Social Security Administration: May apply for disability if have a Stage IV cancer. 336-342-7796 1-800-772-1213 ? [place] [facility]: Assists with nutrition, care and transit needs. 336-349-2343         

## 2017-10-21 ENCOUNTER — Telehealth (HOSPITAL_COMMUNITY): Payer: Self-pay

## 2017-10-21 NOTE — Telephone Encounter (Signed)
24 hour follow up call -patient states she is doing ok except for her blood sugar is up due to the steroids. Encouraged patient to keep a close eye on this and to call us if it got higher than 350. Went over the steroid dosage again for the next two days. Patient takes Metformin twice a day. Otherwise no complaints from patient . Neulasta onpro will be coming off in the next hour and she verbalized understanding.

## 2017-10-27 ENCOUNTER — Other Ambulatory Visit: Payer: Self-pay

## 2017-10-27 ENCOUNTER — Encounter (HOSPITAL_COMMUNITY): Payer: Self-pay | Admitting: Oncology

## 2017-10-27 ENCOUNTER — Encounter (HOSPITAL_COMMUNITY): Payer: Medicare Other

## 2017-10-27 ENCOUNTER — Encounter (HOSPITAL_COMMUNITY): Payer: Medicare Other | Attending: Oncology | Admitting: Oncology

## 2017-10-27 VITALS — BP 140/88 | HR 113 | Temp 97.9°F | Resp 18 | Ht 67.5 in | Wt 272.5 lb

## 2017-10-27 DIAGNOSIS — R530 Neoplastic (malignant) related fatigue: Secondary | ICD-10-CM

## 2017-10-27 DIAGNOSIS — Z171 Estrogen receptor negative status [ER-]: Principal | ICD-10-CM

## 2017-10-27 DIAGNOSIS — C50412 Malignant neoplasm of upper-outer quadrant of left female breast: Secondary | ICD-10-CM

## 2017-10-27 DIAGNOSIS — Z9012 Acquired absence of left breast and nipple: Secondary | ICD-10-CM | POA: Diagnosis not present

## 2017-10-27 LAB — CBC WITH DIFFERENTIAL/PLATELET
BASOS PCT: 0 %
Basophils Absolute: 0 10*3/uL (ref 0.0–0.1)
EOS PCT: 7 %
Eosinophils Absolute: 0.2 10*3/uL (ref 0.0–0.7)
HCT: 38.8 % (ref 36.0–46.0)
HEMOGLOBIN: 12.7 g/dL (ref 12.0–15.0)
Lymphocytes Relative: 83 %
Lymphs Abs: 2.2 10*3/uL (ref 0.7–4.0)
MCH: 28.6 pg (ref 26.0–34.0)
MCHC: 32.7 g/dL (ref 30.0–36.0)
MCV: 87.4 fL (ref 78.0–100.0)
MONO ABS: 0.1 10*3/uL (ref 0.1–1.0)
Monocytes Relative: 3 %
NEUTROS ABS: 0.2 10*3/uL — AB (ref 1.7–7.7)
NEUTROS PCT: 7 %
Platelets: 169 10*3/uL (ref 150–400)
RBC: 4.44 MIL/uL (ref 3.87–5.11)
RDW: 13.3 % (ref 11.5–15.5)
WBC: 2.7 10*3/uL — ABNORMAL LOW (ref 4.0–10.5)

## 2017-10-27 LAB — COMPREHENSIVE METABOLIC PANEL
ALK PHOS: 106 U/L (ref 38–126)
ALT: 26 U/L (ref 14–54)
ANION GAP: 10 (ref 5–15)
AST: 22 U/L (ref 15–41)
Albumin: 3.5 g/dL (ref 3.5–5.0)
BILIRUBIN TOTAL: 0.4 mg/dL (ref 0.3–1.2)
BUN: 14 mg/dL (ref 6–20)
CALCIUM: 9.2 mg/dL (ref 8.9–10.3)
CO2: 23 mmol/L (ref 22–32)
Chloride: 100 mmol/L — ABNORMAL LOW (ref 101–111)
Creatinine, Ser: 0.8 mg/dL (ref 0.44–1.00)
GFR calc non Af Amer: >60 mL/min (ref >60)
Glucose, Bld: 132 mg/dL — ABNORMAL HIGH (ref 65–99)
Potassium: 4 mmol/L (ref 3.5–5.1)
SODIUM: 133 mmol/L — AB (ref 135–145)
TOTAL PROTEIN: 6.8 g/dL (ref 6.5–8.1)

## 2017-10-27 NOTE — Progress Notes (Signed)
[facility] Initial Visit:  Patient Care Team: Lucia Gaskins, MD as PCP - General (Internal Medicine)  CHIEF COMPLAINTS/PURPOSE OF CONSULTATION:  Triple negative stage IB left breast cancer with high grade DCIS.  HISTORY OF PRESENTING ILLNESS: Kelli Wilson 66 y.o. postmenopausal female is here with her dauther for evaluation of triple negative high grade invasive ductal carcinoma of the left breast with high grade DCIS. Patient was referred to me by Dr. Arnoldo Morale.  Patient was found to have a mass in the upper outer quadrant on screening mammogram.  Core biopsy was performed which revealed invasive carcinoma.  I do not have any of her mammograms available to me.  Dr. Arnoldo Morale performed a left modified radical mastectomy with lymph node dissection on 09/15/2017.  Surgical path demonstrated invasive ductal carcinoma, grade 3, spanning 1.8 cm, high-grade ductal carcinoma in situ, resection margins were negative, 8 out of 8 lymph nodes were negative for carcinoma.  Patient states that she was adopted but knew her birth mother.  Her birth mother did not have breast cancer.  She states she had a half sister who had breast cancer in her 26s.  Patient states that she tolerated surgery well.  She still has some swelling at the surgical site and down her left arm.  She denies any chest pain, shortness breath, abdominal pain, focal weakness.  She denies any nausea, vomiting, decreased appetite, weight changes. She states she has chronic baseline diarrhea due to having her gallbladder removed.  She also has some mild neuropathy in her fingertips and toes due to diabetes.  INTERVAL HISTORY: Patient presents today for follow-up to evaluate tolerability to initiation of chemotherapy.  Patient received cycle 1 of Cytoxan and Adriamycin on 10/20/2017.  She states that she has had severe fatigue since then and has been spending most of her time in bed.  She states she has a metallic taste in  her mouth.  She denies any nausea, vomiting, diarrhea.  She states she has not been eating much because she has been so tired that she cannot get up to go by food or cook for herself.  She denies any chest pain, shortness of breath, abdominal pain, focal weakness.  She is currently finishing up a Z-Pak for laryngitis.  Review of Systems - Oncology ROS as per HPI otherwise 12 point ROS is negative.  MEDICAL HISTORY: Past Medical History:  Diagnosis Date  . Anxiety   . Arthritis   . Back pain   . Byssinosis ([place])    secondary to working in a [employer]  . Chest pain   . Chronic pain   . Depression   . Diabetes mellitus without complication ([place])   . Fibromyalgia   . GERD (gastroesophageal reflux disease)   . Hypercholesteremia   . Hypertension   . Knee pain   . Obesity   . Peptic ulcer    history of - normal GI studies Sept 2010    SURGICAL HISTORY: Past Surgical History:  Procedure Laterality Date  . APPENDECTOMY    . bladder rectal repair    . BREAST SURGERY Left    breast biopsy- benign  . CHOLECYSTECTOMY    . DILATION AND CURETTAGE OF UTERUS    . GANGLION CYST EXCISION Left   . MASTECTOMY MODIFIED RADICAL Left 09/15/2017   Procedure: LEFT MODIFIED RADICAL MASTECTOMY;  Surgeon: Aviva Signs, MD;  Location: [facility];  Service: General;  Laterality: Left;  . PORTACATH PLACEMENT Right 10/13/2017   Procedure:  INSERTION PORT-A-CATH RIGHT SUBCLAVIAN;  Surgeon: Aviva Signs, MD;  Location: [facility];  Service: General;  Laterality: Right;  . TOTAL ABDOMINAL HYSTERECTOMY      SOCIAL HISTORY: Social History   Socioeconomic History  . Marital status: Married    Spouse name: Not on file  . Number of children: Not on file  . Years of education: Not on file  . Highest education level: Not on file  Social Needs  . Financial resource strain: Not on file  . Food insecurity - worry: Not on file  . Food insecurity - inability: Not on file  . Transportation needs - medical: Not  on file  . Transportation needs - non-medical: Not on file  Occupational History  . 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 Smoking status: Never Smoker  . Smokeless tobacco: Current User    Types: Snuff  Substance and Sexual Activity  . Alcohol use: No  . Drug use: No  . Sexual activity: No    Birth control/protection: Surgical  Other Topics Concern  . Not on file  Social History Narrative  . Not on file    FAMILY HISTORY Family History  Problem Relation Age of Onset  . Aneurysm Father     ALLERGIES:  is allergic to other; iohexol; ivp dye [iodinated diagnostic agents]; [place] [sitagliptin]; latex; motrin [ibuprofen]; nsaids; statins; sulfa antibiotics; and adhesive [tape].  MEDICATIONS:  Current Outpatient Medications  Medication Sig Dispense Refill  . albuterol (PROVENTIL) (2.5 MG/3ML) 0.083% nebulizer solution Take 2.5 mg by nebulization every 6 (six) hours as needed for wheezing or shortness of breath.     . ALPRAZolam (XANAX) 1 MG tablet Take 1 mg 3 (three) times daily as needed by mouth for anxiety.     Marland Kitchen azithromycin (ZITHROMAX) 250 MG tablet TAKE TWO (2) TABLETS ON FIRST DAY, THEN TAKE ONE (1) TABLET EACH DAY FOR THE NEXT 4 DAYS.  0  . Camphor-Eucalyptus-Menthol (VICKS VAPORUB EX) Apply 1 application daily as needed topically (congestion).    . Cholecalciferol (VITAMIN D3) 5000 units CAPS Take 5,000 Units by mouth daily.    . cyclophosphamide (CYTOXAN) 2 g chemo injection Inject once into the vein. Every 2 weeks for 4 cycles    . dexamethasone (DECADRON) 4 MG tablet Take 2 tablets by mouth once a day on the day after chemotherapy and then take 2 tablets two times a day for 2 days. Take with food. 30 tablet 1  . DOXOrubicin HCl (ADRIAMYCIN IV) Inject into the vein. Every 2 weeks for 4 cycles    . HYDROcodone-acetaminophen (NORCO) 5-325 MG tablet Take 1 tablet every 6 (six) hours as needed by mouth for moderate pain. 20  tablet 0  . hydrOXYzine (ATARAX/VISTARIL) 50 MG tablet Take 50 mg by mouth at bedtime.     . Lidocaine 0.5 % GEL Apply 1 application topically as needed (for pain).    Marland Kitchen lidocaine-prilocaine (EMLA) cream Apply to affected area once 30 g 3  . metFORMIN (GLUCOPHAGE) 500 MG tablet Take 500 mg by mouth 2 (two) times daily with a meal.     . nitroGLYCERIN (NITROSTAT) 0.4 MG SL tablet Place 0.4 mg under the tongue every 5 (five) minutes as needed for chest pain.    Marland Kitchen omeprazole (PRILOSEC) 40 MG capsule Take 40 mg every other day by mouth. At night    . ondansetron (ZOFRAN) 8 MG tablet Take 1 tablet (8 mg total) 2 (two)  times daily as needed by mouth. Start on the third day after chemotherapy. 30 tablet 1  . Pegfilgrastim (NEULASTA ONPRO [place]) Inject into the skin. For 4 cycles    . pravastatin (PRAVACHOL) 40 MG tablet Take 40 mg by mouth daily.    . prochlorperazine (COMPAZINE) 10 MG tablet Take 1 tablet (10 mg total) every 6 (six) hours as needed by mouth (Nausea or vomiting). 30 tablet 1  . promethazine (PHENERGAN) 25 MG tablet Take 25 mg by mouth every 6 (six) hours as needed for nausea or vomiting.    . Pseudoeph-Doxylamine-DM-APAP (NYQUIL PO) Take 2 tablets at bedtime as needed by mouth (sinus headaches).     No current facility-administered medications for this visit.     PHYSICAL EXAMINATION:  ECOG PERFORMANCE STATUS: 0 - Asymptomatic  Pressure 134/82, pulse 90, respiratory rate 16, temperature 97.8, O2 sat 97%, Wt 272.5 lbs, height 60.7 in.  Physical Exam Constitutional: Well-developed, well-nourished, in no acute distress, tired looking.   HENT:  Head: Normocephalic and atraumatic.  Mouth/Throat: No oropharyngeal exudate. Mucosa moist. Eyes: Pupils are equal, round, and reactive to light. Conjunctivae are normal. No scleral icterus.  Neck: Normal range of motion. Neck supple. No JVD present.  Cardiovascular: Normal rate, regular rhythm and normal heart sounds.  Exam reveals no gallop  and no friction rub.   No murmur heard. Pulmonary/Chest: Effort normal and breath sounds normal. No respiratory distress. No wheezes.No rales.  Abdominal: Soft. Bowel sounds are normal. No distension. There is no tenderness. There is no guarding.  Musculoskeletal: No edema or tenderness.  Lymphadenopathy:    No cervical or supraclavicular adenopathy.  Neurological: Alert and oriented to person, place, and time. No cranial nerve deficit.  Skin: Skin is warm and dry. No rash noted. No erythema. No pallor.  Psychiatric: Affect and judgment normal.  Breast: Left mastectomy site well healed. Right breast without masses, nipple discharge, axillary lymphadenopathy.    LABORATORY DATA: I have personally reviewed the data as listed:  Appointment on 10/27/2017  Component Date Value Ref Range Status  . WBC 10/27/2017 2.7* 4.0 - 10.5 K/uL Final  . RBC 10/27/2017 4.44  3.87 - 5.11 MIL/uL Final  . Hemoglobin 10/27/2017 12.7  12.0 - 15.0 g/dL Final  . HCT 10/27/2017 38.8  36.0 - 46.0 % Final  . MCV 10/27/2017 87.4  78.0 - 100.0 fL Final  . MCH 10/27/2017 28.6  26.0 - 34.0 pg Final  . MCHC 10/27/2017 32.7  30.0 - 36.0 g/dL Final  . RDW 10/27/2017 13.3  11.5 - 15.5 % Final  . Platelets 10/27/2017 169  150 - 400 K/uL Final  . Neutrophils Relative % 10/27/2017 7  % Final  . Lymphocytes Relative 10/27/2017 83  % Final  . Monocytes Relative 10/27/2017 3  % Final  . Eosinophils Relative 10/27/2017 7  % Final  . Basophils Relative 10/27/2017 0  % Final  . Neutro Abs 10/27/2017 0.2* 1.7 - 7.7 K/uL Final  . Lymphs Abs 10/27/2017 2.2  0.7 - 4.0 K/uL Final  . Monocytes Absolute 10/27/2017 0.1  0.1 - 1.0 K/uL Final  . Eosinophils Absolute 10/27/2017 0.2  0.0 - 0.7 K/uL Final  . Basophils Absolute 10/27/2017 0.0  0.0 - 0.1 K/uL Final  . Sodium 10/27/2017 133* 135 - 145 mmol/L Final  . Potassium 10/27/2017 4.0  3.5 - 5.1 mmol/L Final  . Chloride 10/27/2017 100* 101 - 111 mmol/L Final  . CO2 10/27/2017 23   22 - 32 mmol/L Final  .  Glucose, Bld 10/27/2017 132* 65 - 99 mg/dL Final  . BUN 10/27/2017 14  6 - 20 mg/dL Final  . Creatinine, Ser 10/27/2017 0.80  0.44 - 1.00 mg/dL Final  . Calcium 10/27/2017 9.2  8.9 - 10.3 mg/dL Final  . Total Protein 10/27/2017 6.8  6.5 - 8.1 g/dL Final  . Albumin 10/27/2017 3.5  3.5 - 5.0 g/dL Final  . AST 10/27/2017 22  15 - 41 U/L Final  . ALT 10/27/2017 26  14 - 54 U/L Final  . Alkaline Phosphatase 10/27/2017 106  38 - 126 U/L Final  . Total Bilirubin 10/27/2017 0.4  0.3 - 1.2 mg/dL Final  . GFR calc non Af Amer 10/27/2017 >60  >60 mL/min Final  . GFR calc Af Amer 10/27/2017 >60  >60 mL/min Final   Comment: (NOTE) The eGFR has been calculated using the CKD EPI equation. This calculation has not been validated in all clinical situations. eGFR's persistently <60 mL/min signify possible Chronic Kidney Disease.   . Anion gap 10/27/2017 10  5 - 15 Final  Infusion on 10/20/2017  Component Date Value Ref Range Status  . Sodium 10/20/2017 136  135 - 145 mmol/L Final  . Potassium 10/20/2017 3.6  3.5 - 5.1 mmol/L Final  . Chloride 10/20/2017 101  101 - 111 mmol/L Final  . CO2 10/20/2017 25  22 - 32 mmol/L Final  . Glucose, Bld 10/20/2017 131* 65 - 99 mg/dL Final  . BUN 10/20/2017 9  6 - 20 mg/dL Final  . Creatinine, Ser 10/20/2017 0.92  0.44 - 1.00 mg/dL Final  . Calcium 10/20/2017 8.9  8.9 - 10.3 mg/dL Final  . Total Protein 10/20/2017 7.1  6.5 - 8.1 g/dL Final  . Albumin 10/20/2017 3.7  3.5 - 5.0 g/dL Final  . AST 10/20/2017 20  15 - 41 U/L Final  . ALT 10/20/2017 15  14 - 54 U/L Final  . Alkaline Phosphatase 10/20/2017 87  38 - 126 U/L Final  . Total Bilirubin 10/20/2017 0.4  0.3 - 1.2 mg/dL Final  . GFR calc non Af Amer 10/20/2017 >60  >60 mL/min Final  . GFR calc Af Amer 10/20/2017 >60  >60 mL/min Final   Comment: (NOTE) The eGFR has been calculated using the CKD EPI equation. This calculation has not been validated in all clinical situations. eGFR's  persistently <60 mL/min signify possible Chronic Kidney Disease.   . Anion gap 10/20/2017 10  5 - 15 Final  . WBC 10/20/2017 8.2  4.0 - 10.5 K/uL Final  . RBC 10/20/2017 4.41  3.87 - 5.11 MIL/uL Final  . Hemoglobin 10/20/2017 12.6  12.0 - 15.0 g/dL Final  . HCT 10/20/2017 39.0  36.0 - 46.0 % Final  . MCV 10/20/2017 88.4  78.0 - 100.0 fL Final  . MCH 10/20/2017 28.6  26.0 - 34.0 pg Final  . MCHC 10/20/2017 32.3  30.0 - 36.0 g/dL Final  . RDW 10/20/2017 13.8  11.5 - 15.5 % Final  . Platelets 10/20/2017 222  150 - 400 K/uL Final  . Neutrophils Relative % 10/20/2017 38  % Final  . Neutro Abs 10/20/2017 3.1  1.7 - 7.7 K/uL Final  . Lymphocytes Relative 10/20/2017 48  % Final  . Lymphs Abs 10/20/2017 3.9  0.7 - 4.0 K/uL Final  . Monocytes Relative 10/20/2017 8  % Final  . Monocytes Absolute 10/20/2017 0.7  0.1 - 1.0 K/uL Final  . Eosinophils Relative 10/20/2017 5  % Final  . Eosinophils Absolute 10/20/2017 0.4  0.0 - 0.7 K/uL Final  . Basophils Relative 10/20/2017 1  % Final  . Basophils Absolute 10/20/2017 0.0  0.0 - 0.1 K/uL Final  Admission on 10/13/2017, Discharged on 10/13/2017  Component Date Value Ref Range Status  . Glucose-Capillary 10/13/2017 119* 65 - 99 mg/dL Final  . Comment 1 10/13/2017 Document in [facility] Outpatient Visit on 10/10/2017  Component Date Value Ref Range Status  . LV PW d 10/10/2017 12.8* 0.6 - 1.1 mm Final  . FS 10/10/2017 26* 28 - 44 % Final  . LA ID, A-P, ES 10/10/2017 37  mm Final  . LA diam end sys 10/10/2017 37.00  mm Final  . LA vol 10/10/2017 49.1  mL Final  . LA vol index 10/10/2017 19.7  mL/m2 Final  . LV sys vol 10/10/2017 22  mL Final  . LV sys vol index 10/10/2017 9.0  mL/m2 Final  . LV dias vol 10/10/2017 68  46 - 106 mL Final  . LV dias vol index 10/10/2017 27.0  mL/m2 Final  . Simpson's disk 10/10/2017 68.00   Final  . IVS/LV PW RATIO, ED 10/10/2017 0.99   Final  . Stroke v 10/10/2017 46  ml Final  . LVOT diameter  10/10/2017 22  mm Final  . LVOT VTI 10/10/2017 21.1  cm Final  . LV e' LATERAL 10/10/2017 7.62  cm/s Final  . LV E/e' medial 10/10/2017 10.58   Final  . LV E/e'average 10/10/2017 10.58   Final  . LA diam index 10/10/2017 1.49  cm/m2 Final  . LA vol A4C 10/10/2017 51.5  ml Final  . LVOT peak grad rest 10/10/2017 5  mmHg Final  . E decel time 10/10/2017 327  msec Final  . LVOT area 10/10/2017 3.80  cm2 Final  . LVOT peak vel 10/10/2017 114  cm/s Final  . LVOT SV 10/10/2017 80.00  mL Final  . Peak grad 10/10/2017 3  mmHg Final  . E/e' ratio 10/10/2017 10.58   Final  . MV pk E vel 10/10/2017 80.6  m/s Final  . MV pk A vel 10/10/2017 104  m/s Final  . MV Dec 10/10/2017 327   Final  . TDI e' medial 10/10/2017 6.42   Final  . TDI e' lateral 10/10/2017 7.62   Final  . Lateral S' vel 10/10/2017 11.50  cm/sec Final  . TAPSE 10/10/2017 15.60  mm Final  Appointment on 10/09/2017  Component Date Value Ref Range Status  . WBC 10/09/2017 9.8  4.0 - 10.5 K/uL Final  . RBC 10/09/2017 4.61  3.87 - 5.11 MIL/uL Final  . Hemoglobin 10/09/2017 13.7  12.0 - 15.0 g/dL Final  . HCT 10/09/2017 40.7  36.0 - 46.0 % Final  . MCV 10/09/2017 88.3  78.0 - 100.0 fL Final  . MCH 10/09/2017 29.7  26.0 - 34.0 pg Final  . MCHC 10/09/2017 33.7  30.0 - 36.0 g/dL Final  . RDW 10/09/2017 14.2  11.5 - 15.5 % Final  . Platelets 10/09/2017 262  150 - 400 K/uL Final  . Neutrophils Relative % 10/09/2017 43  % Final  . Neutro Abs 10/09/2017 4.2  1.7 - 7.7 K/uL Final  . Lymphocytes Relative 10/09/2017 45  % Final  . Lymphs Abs 10/09/2017 4.4* 0.7 - 4.0 K/uL Final  . Monocytes Relative 10/09/2017 8  % Final  . Monocytes Absolute 10/09/2017 0.7  0.1 - 1.0 K/uL Final  . Eosinophils Relative 10/09/2017 3  % Final  . Eosinophils  Absolute 10/09/2017 0.3  0.0 - 0.7 K/uL Final  . Basophils Relative 10/09/2017 1  % Final  . Basophils Absolute 10/09/2017 0.1  0.0 - 0.1 K/uL Final  . Sodium 10/09/2017 136  135 - 145 mmol/L Final  .  Potassium 10/09/2017 4.4  3.5 - 5.1 mmol/L Final  . Chloride 10/09/2017 100* 101 - 111 mmol/L Final  . CO2 10/09/2017 27  22 - 32 mmol/L Final  . Glucose, Bld 10/09/2017 160* 65 - 99 mg/dL Final  . BUN 10/09/2017 11  6 - 20 mg/dL Final  . Creatinine, Ser 10/09/2017 1.10* 0.44 - 1.00 mg/dL Final  . Calcium 10/09/2017 9.5  8.9 - 10.3 mg/dL Final  . Total Protein 10/09/2017 7.4  6.5 - 8.1 g/dL Final  . Albumin 10/09/2017 4.0  3.5 - 5.0 g/dL Final  . AST 10/09/2017 21  15 - 41 U/L Final  . ALT 10/09/2017 16  14 - 54 U/L Final  . Alkaline Phosphatase 10/09/2017 88  38 - 126 U/L Final  . Total Bilirubin 10/09/2017 0.6  0.3 - 1.2 mg/dL Final  . GFR calc non Af Amer 10/09/2017 50* >60 mL/min Final  . GFR calc Af Amer 10/09/2017 58* >60 mL/min Final   Comment: (NOTE) The eGFR has been calculated using the CKD EPI equation. This calculation has not been validated in all clinical situations. eGFR's persistently <60 mL/min signify possible Chronic Kidney Disease.   . Anion gap 10/09/2017 9  5 - 15 Final    RADIOGRAPHIC STUDIES: I have personally reviewed the radiological images as listed and agree with the findings in the report  No results found.  PATHOLOGY: Patient: Kelli, Wilson Collected: 09/15/2017 Client: [facility] Accession: [id number] Received: 09/15/2017 Aviva Signs DOB: 11-Apr-1951 Age: 32 Gender: F Reported: 09/16/2017 [address] Patient Ph: [phone] MRN #: [id number] [place], [place] [address] Visit #: [id number] Chart #: Phone: [phone] Fax: CC: REPORT OF SURGICAL PATHOLOGY FINAL DIAGNOSIS Diagnosis Breast, modified radical mastectomy , left - INVASIVE DUCTAL CARCINOMA, GRADE 3, SPANNING 1.8 CM. - HIGH GRADE DUCTAL CARCINOMA IN SITU. - RESECTION MARGINS ARE NEGATIVE. - EIGHT OF EIGHT LYMPH NODES NEGATIVE FOR CARCINOMA (0/8). - SEE ONCOLOGY TABLE. Microscopic Comment BREAST, INVASIVE TUMOR Procedure: Left modified radical  mastectomy. Laterality: Left. Tumor Size: 1.8 cm. Histologic Type: Invasive ductal carcinoma. Grade: 3 Tubular Differentiation: 3 Nuclear Pleomorphism: 2 Mitotic Count: 3 Ductal Carcinoma in Situ (DCIS): Present, high grade. Extent of Tumor: Confined to breast parenchyma. Margins: Invasive carcinoma, distance from closest margin: 2 cm (posterior). DCIS, distance from closest margin: 2 cm (posterior). Regional Lymph Nodes: Number of Lymph Nodes Examined: 8 Number of Sentinel Lymph Nodes Examined: 0 Lymph Nodes with Macrometastases: 0 Lymph Nodes with Micrometastases: 0 Lymph Nodes with Isolated Tumor Cells: 0 Breast Prognostic Profile: Performed on biopsy [id number], see below. Will not be repeated. Estrogen Receptor: Negative. Progesterone Receptor: Negative. Her2: Negative (1.30 ratio). 1 of 3 FINAL for Kelli, Wilson ([id number]) Microscopic Comment(continued) Ki-67: 15% Best tumor block for sendout testing: 1D Pathologic Stage Classification (pTNM, AJCC 8th Edition): Primary Tumor (pT): pT1c Regional Lymph Nodes (pN): pN0 Distant Metastases (pM): pMX Comments: None. Vicente Males MD Pathologist, Electronic Signature   ASSESSMENT/PLAN Cancer Staging Malignant neoplasm of upper-outer quadrant of left female breast [facility]) Staging form: Breast, AJCC 8th Edition - Clinical: cT1c, cN0 - Unsigned - Pathologic: Stage IB (pT1c, pN0, cM0, G3, ER: Negative, PR: Negative, HER2: Negative) - Signed by Twana First, MD on 10/09/2017  -Patient had severe  fatigue with cycle 1 of AC, therefore I will decrease her chemo by 25%. -Encouraged her to drink Boost/Ensure to supplement, we have given her some today. -RTC as scheduled on 11/03/17 for cycle 2 of AC. -RTC for follow up with cycle 3 of AC.    All questions were answered. The patient knows to call the clinic with any problems, questions or concerns.  This note was electronically signed.    Twana First, MD  10/27/2017 3:29  PM

## 2017-10-27 NOTE — Progress Notes (Signed)
Offered the patient an OT referral for the evaluation of her lymphedema in her left arm and the pt refused at this time.  The pt states that she is too tired to do anything else right now.  I explained that this could get worse over time but she states that it is getting better everyday.

## 2017-10-29 ENCOUNTER — Telehealth (HOSPITAL_COMMUNITY): Payer: Self-pay | Admitting: Emergency Medicine

## 2017-10-29 NOTE — Telephone Encounter (Signed)
Pt called and stated that she was having back pain that was radiating down into her sides.  She states that she took a claratin at 1 am and another one today with no relief.  Her pain medication has help only a little bit and the nerve pill helped the most.  It is better today.  If she gets in the right position the pain is better.  She states that yesterday it started in the chest wall and went into her ribs that why she thought it was the bone pain.  RN explained that she has not had the neulasta sine 11/26 and the bone pain should only last for a few day after and should not be in her chest area.  RN told her to keep taking her pain medication and she could use her nerve pill also.  Dr Talbert Cage is in agreement with this.

## 2017-11-03 ENCOUNTER — Ambulatory Visit (HOSPITAL_COMMUNITY): Payer: Medicare Other

## 2017-11-05 ENCOUNTER — Encounter (HOSPITAL_BASED_OUTPATIENT_CLINIC_OR_DEPARTMENT_OTHER): Payer: Medicare Other

## 2017-11-05 ENCOUNTER — Other Ambulatory Visit: Payer: Self-pay

## 2017-11-05 ENCOUNTER — Encounter (HOSPITAL_COMMUNITY): Payer: Self-pay

## 2017-11-05 VITALS — BP 134/72 | HR 99 | Temp 98.1°F | Resp 18 | Wt 274.2 lb

## 2017-11-05 DIAGNOSIS — C50412 Malignant neoplasm of upper-outer quadrant of left female breast: Secondary | ICD-10-CM

## 2017-11-05 DIAGNOSIS — Z5111 Encounter for antineoplastic chemotherapy: Secondary | ICD-10-CM | POA: Diagnosis not present

## 2017-11-05 DIAGNOSIS — Z171 Estrogen receptor negative status [ER-]: Principal | ICD-10-CM

## 2017-11-05 LAB — CBC WITH DIFFERENTIAL/PLATELET
BASOS ABS: 0.1 10*3/uL (ref 0.0–0.1)
BASOS PCT: 1 %
Eosinophils Absolute: 0 10*3/uL (ref 0.0–0.7)
Eosinophils Relative: 0 %
HEMATOCRIT: 38.9 % (ref 36.0–46.0)
HEMOGLOBIN: 12.7 g/dL (ref 12.0–15.0)
LYMPHS PCT: 32 %
Lymphs Abs: 3.3 10*3/uL (ref 0.7–4.0)
MCH: 28.9 pg (ref 26.0–34.0)
MCHC: 32.6 g/dL (ref 30.0–36.0)
MCV: 88.6 fL (ref 78.0–100.0)
Monocytes Absolute: 0.6 10*3/uL (ref 0.1–1.0)
Monocytes Relative: 6 %
NEUTROS PCT: 61 %
Neutro Abs: 6.3 10*3/uL (ref 1.7–7.7)
Platelets: 199 10*3/uL (ref 150–400)
RBC: 4.39 MIL/uL (ref 3.87–5.11)
RDW: 14 % (ref 11.5–15.5)
WBC: 10.3 10*3/uL (ref 4.0–10.5)

## 2017-11-05 LAB — COMPREHENSIVE METABOLIC PANEL
ALBUMIN: 3.6 g/dL (ref 3.5–5.0)
ALK PHOS: 86 U/L (ref 38–126)
ALT: 27 U/L (ref 14–54)
ANION GAP: 10 (ref 5–15)
AST: 25 U/L (ref 15–41)
BILIRUBIN TOTAL: 0.4 mg/dL (ref 0.3–1.2)
BUN: 15 mg/dL (ref 6–20)
CALCIUM: 9.2 mg/dL (ref 8.9–10.3)
CO2: 24 mmol/L (ref 22–32)
Chloride: 103 mmol/L (ref 101–111)
Creatinine, Ser: 0.97 mg/dL (ref 0.44–1.00)
GFR calc non Af Amer: 59 mL/min — ABNORMAL LOW (ref >60)
GLUCOSE: 180 mg/dL — AB (ref 65–99)
POTASSIUM: 3.8 mmol/L (ref 3.5–5.1)
SODIUM: 137 mmol/L (ref 135–145)
TOTAL PROTEIN: 6.9 g/dL (ref 6.5–8.1)

## 2017-11-05 MED ORDER — SODIUM CHLORIDE 0.9 % IV SOLN
Freq: Once | INTRAVENOUS | Status: AC
  Administered 2017-11-05: 10:00:00 via INTRAVENOUS

## 2017-11-05 MED ORDER — PEGFILGRASTIM 6 MG/0.6ML ~~LOC~~ PSKT
6.0000 mg | PREFILLED_SYRINGE | Freq: Once | SUBCUTANEOUS | Status: AC
  Administered 2017-11-05: 6 mg via SUBCUTANEOUS
  Filled 2017-11-05: qty 0.6

## 2017-11-05 MED ORDER — SODIUM CHLORIDE 0.9 % IV SOLN
Freq: Once | INTRAVENOUS | Status: AC
  Administered 2017-11-05: 10:00:00 via INTRAVENOUS
  Filled 2017-11-05: qty 5

## 2017-11-05 MED ORDER — SODIUM CHLORIDE 0.9 % IV SOLN
450.0000 mg/m2 | Freq: Once | INTRAVENOUS | Status: AC
  Administered 2017-11-05: 1100 mg via INTRAVENOUS
  Filled 2017-11-05: qty 5

## 2017-11-05 MED ORDER — HEPARIN SOD (PORK) LOCK FLUSH 100 UNIT/ML IV SOLN
500.0000 [IU] | Freq: Once | INTRAVENOUS | Status: AC | PRN
  Administered 2017-11-05: 500 [IU]
  Filled 2017-11-05: qty 5

## 2017-11-05 MED ORDER — DOXORUBICIN HCL CHEMO IV INJECTION 2 MG/ML
45.0000 mg/m2 | Freq: Once | INTRAVENOUS | Status: AC
  Administered 2017-11-05: 110 mg via INTRAVENOUS
  Filled 2017-11-05: qty 55

## 2017-11-05 MED ORDER — PALONOSETRON HCL INJECTION 0.25 MG/5ML
0.2500 mg | Freq: Once | INTRAVENOUS | Status: AC
  Administered 2017-11-05: 0.25 mg via INTRAVENOUS
  Filled 2017-11-05: qty 5

## 2017-11-05 MED ORDER — SODIUM CHLORIDE 0.9% FLUSH
10.0000 mL | INTRAVENOUS | Status: DC | PRN
  Administered 2017-11-05: 10 mL
  Filled 2017-11-05: qty 10

## 2017-11-05 NOTE — Progress Notes (Signed)
Labs results reviewed with MD. Proceed with treatment today.  Marland KitchenTonye Pearson Wilson arrived today for ONPRO neulasta on body injector. See MAR for administration details. Injector in place and engaged with green light indicator on flashing. Tolerated application with out problems.  Treatment given per orders. Patient tolerated it well without problems. Vitals stable and discharged home from clinic ambulatory. Follow up as scheduled.

## 2017-11-05 NOTE — Patient Instructions (Signed)
[facility] Discharge Instructions for Patients Receiving Chemotherapy   Beginning January 23rd 2017 lab work for the [facility] will be done in the  Main lab at [facility] on 1st floor. If you have a lab appointment with the [facility] please come in thru the  Main Entrance and check in at the main information desk   Today you received the following chemotherapy agents      If you develop nausea and vomiting, or diarrhea that is not controlled by your medication, call the clinic.  The clinic phone number is [phone]. Office hours are Monday-Friday 8:30am-5:00pm.  BELOW ARE SYMPTOMS THAT SHOULD BE REPORTED IMMEDIATELY:  *FEVER GREATER THAN 101.0 F  *CHILLS WITH OR WITHOUT FEVER  NAUSEA AND VOMITING THAT IS NOT CONTROLLED WITH YOUR NAUSEA MEDICATION  *UNUSUAL SHORTNESS OF BREATH  *UNUSUAL BRUISING OR BLEEDING  TENDERNESS IN MOUTH AND THROAT WITH OR WITHOUT PRESENCE OF ULCERS  *URINARY PROBLEMS  *BOWEL PROBLEMS  UNUSUAL RASH Items with * indicate a potential emergency and should be followed up as soon as possible. If you have an emergency after office hours please contact your primary care physician or go to the nearest emergency department.  Please call the clinic during office hours if you have any questions or concerns.   You may also contact the Patient Navigator at [phone] should you have any questions or need assistance in obtaining follow up care.      Resources For Cancer Patients and their Caregivers ? American Cancer Society: Can assist with transportation, wigs, general needs, runs Look Good Feel Better.        6207075927 ? Cancer Care: Provides financial assistance, online support groups, medication/co-pay assistance.  1-800-813-HOPE (561)450-8439) ? [employer] Assists [place] cancer patients and their families through emotional , educational and financial support.  909-042-1147 ? [place] DSS Where to apply for food stamps, Medicaid and utility assistance. (618)535-4714 ? [facility]: Transportation to medical appointments. 949-569-1653 ? Social Security Administration: May apply for disability if have a Stage IV cancer. ([phone] 5310348928 ? [employer], Disability and Transit Services: Assists with nutrition, care and transit needs. 616-521-8910

## 2017-11-06 ENCOUNTER — Encounter (HOSPITAL_BASED_OUTPATIENT_CLINIC_OR_DEPARTMENT_OTHER): Payer: Medicare Other

## 2017-11-06 ENCOUNTER — Telehealth (HOSPITAL_COMMUNITY): Payer: Self-pay | Admitting: Emergency Medicine

## 2017-11-06 ENCOUNTER — Encounter (HOSPITAL_COMMUNITY): Payer: Self-pay

## 2017-11-06 DIAGNOSIS — C50412 Malignant neoplasm of upper-outer quadrant of left female breast: Secondary | ICD-10-CM | POA: Diagnosis not present

## 2017-11-06 DIAGNOSIS — Z5189 Encounter for other specified aftercare: Secondary | ICD-10-CM

## 2017-11-06 MED ORDER — PEGFILGRASTIM INJECTION 6 MG/0.6ML ~~LOC~~
PREFILLED_SYRINGE | SUBCUTANEOUS | Status: AC
Start: 2017-11-06 — End: ?
  Filled 2017-11-06: qty 0.6

## 2017-11-06 MED ORDER — PEGFILGRASTIM INJECTION 6 MG/0.6ML ~~LOC~~
6.0000 mg | PREFILLED_SYRINGE | Freq: Once | SUBCUTANEOUS | Status: AC
  Administered 2017-11-06: 6 mg via SUBCUTANEOUS

## 2017-11-06 NOTE — Patient Instructions (Signed)
[facility] at [facility] Discharge Instructions  RECOMMENDATIONS MADE BY THE CONSULTANT AND ANY TEST RESULTS WILL BE SENT TO YOUR REFERRING PHYSICIAN.  Received Neulasta injection today. Follow-up as scheduled. Call clinic for any questions or concerns  Thank you for choosing [facility] at [facility] to provide your oncology and hematology care.  To afford each patient quality time with our provider, please arrive at least 15 minutes before your scheduled appointment time.    If you have a lab appointment with the Cancer Center please come in thru the  Main Entrance and check in at the main information desk  You need to re-schedule your appointment should you arrive 10 or more minutes late.  We strive to give you quality time with our providers, and arriving late affects you and other patients whose appointments are after yours.  Also, if you no show three or more times for appointments you may be dismissed from the clinic at the providers discretion.     Again, thank you for choosing [facility].  Our hope is that these requests will decrease the amount of time that you wait before being seen by our physicians.       _____________________________________________________________  Should you have questions after your visit to [facility], please contact our office at [phone] between the hours of 8:30 a.m. and 4:30 p.m.  Voicemails left after 4:30 p.m. will not be returned until the following business day.  For prescription refill requests, have your pharmacy contact our office.       Resources For Cancer Patients and their Caregivers ? American Cancer Society: Can assist with transportation, wigs, general needs, runs Look Good Feel Better.        1-888-227-6333 ? Cancer Care: Provides financial assistance, online support groups, medication/co-pay assistance.  1-800-813-HOPE (4673) ? [facility] Assists [place] cancer patients and their families through emotional , educational and financial support.  336-427-4357 ? [place] DSS Where to apply for food stamps, Medicaid and utility assistance. 336-342-1394 ? [facility]: Transportation to medical appointments. 336-347-2287 ? Social Security Administration: May apply for disability if have a Stage IV cancer. 336-342-7796 1-800-772-1213 ? [place] Aging, Disability and Transit Services: Assists with nutrition, care and transit needs. 336-349-2343  Cancer Center Support Programs: @[email or web address]@ > Cancer Support Group  2nd Tuesday of the month 1pm-2pm, Journey Room  > Creative Journey  3rd Tuesday of the month 1130am-1pm, Journey Room  > Look Good Feel Better  1st Wednesday of the month 10am-12 noon, Journey Room (Call American Cancer Society to register 1-800-395-5775)   

## 2017-11-06 NOTE — Progress Notes (Signed)
Kelli Wilson accidentally pulled her Neulasta on-pro off before she received the medication so she called and came in to get a Neulasta injection. Pt tolerated the injection well without any issues. VSS. Pt discharged self ambulatory in satisfactory condition

## 2017-11-06 NOTE — Telephone Encounter (Signed)
Pt called and stated that she had pulled the neulasta on pro off her arm before it had finished or even started.  She doesn't know.  When she called me the injector was infusing the medication out of the cannula.  Spoke with Tori RN who states that pt could come in today to get the injection.  Vauda states that she will leave to come in.

## 2017-11-14 ENCOUNTER — Encounter (HOSPITAL_COMMUNITY): Payer: Medicare Other

## 2017-11-14 ENCOUNTER — Ambulatory Visit (HOSPITAL_COMMUNITY): Payer: Medicare Other | Admitting: Adult Health

## 2017-11-14 ENCOUNTER — Ambulatory Visit (HOSPITAL_COMMUNITY): Payer: Medicare Other

## 2017-11-14 NOTE — Progress Notes (Deleted)
[facility] Initial Visit:  Patient Care Team: Lucia Gaskins, MD as PCP - General (Internal Medicine)  CHIEF COMPLAINTS/PURPOSE OF CONSULTATION:  Triple negative stage IB left breast cancer with high grade DCIS.  HISTORY OF PRESENTING ILLNESS: Kelli Wilson 66 y.o. postmenopausal female is here with her dauther for evaluation of triple negative high grade invasive ductal carcinoma of the left breast with high grade DCIS. Patient was referred to me by Dr. Arnoldo Morale.  Patient was found to have a mass in the upper outer quadrant on screening mammogram.  Core biopsy was performed which revealed invasive carcinoma.  I do not have any of her mammograms available to me.  Dr. Arnoldo Morale performed a left modified radical mastectomy with lymph node dissection on 09/15/2017.  Surgical path demonstrated invasive ductal carcinoma, grade 3, spanning 1.8 cm, high-grade ductal carcinoma in situ, resection margins were negative, 8 out of 8 lymph nodes were negative for carcinoma.  Patient states that she was adopted but knew her birth mother.  Her birth mother did not have breast cancer.  She states she had a half sister who had breast cancer in her 26s.  Patient states that she tolerated surgery well.  She still has some swelling at the surgical site and down her left arm.  She denies any chest pain, shortness breath, abdominal pain, focal weakness.  She denies any nausea, vomiting, decreased appetite, weight changes. She states she has chronic baseline diarrhea due to having her gallbladder removed.  She also has some mild neuropathy in her fingertips and toes due to diabetes.  INTERVAL HISTORY: Patient presents today for follow-up to evaluate tolerability to initiation of chemotherapy.  Patient received cycle 1 of Cytoxan and Adriamycin on 10/20/2017.  She states that she has had severe fatigue since then and has been spending most of her time in bed.  She states she has a metallic taste in  her mouth.  She denies any nausea, vomiting, diarrhea.  She states she has not been eating much because she has been so tired that she cannot get up to go by food or cook for herself.  She denies any chest pain, shortness of breath, abdominal pain, focal weakness.  She is currently finishing up a Z-Pak for laryngitis.  Review of Systems - Oncology ROS as per HPI otherwise 12 point ROS is negative.  MEDICAL HISTORY: Past Medical History:  Diagnosis Date  . Anxiety   . Arthritis   . Back pain   . Byssinosis ([place])    secondary to working in a [employer]  . Chest pain   . Chronic pain   . Depression   . Diabetes mellitus without complication ([place])   . Fibromyalgia   . GERD (gastroesophageal reflux disease)   . Hypercholesteremia   . Hypertension   . Knee pain   . Obesity   . Peptic ulcer    history of - normal GI studies Sept 2010    SURGICAL HISTORY: Past Surgical History:  Procedure Laterality Date  . APPENDECTOMY    . bladder rectal repair    . BREAST SURGERY Left    breast biopsy- benign  . CHOLECYSTECTOMY    . DILATION AND CURETTAGE OF UTERUS    . GANGLION CYST EXCISION Left   . MASTECTOMY MODIFIED RADICAL Left 09/15/2017   Procedure: LEFT MODIFIED RADICAL MASTECTOMY;  Surgeon: Aviva Signs, MD;  Location: [facility];  Service: General;  Laterality: Left;  . PORTACATH PLACEMENT Right 10/13/2017   Procedure:  INSERTION PORT-A-CATH RIGHT SUBCLAVIAN;  Surgeon: Aviva Signs, MD;  Location: [facility];  Service: General;  Laterality: Right;  . TOTAL ABDOMINAL HYSTERECTOMY      SOCIAL HISTORY: Social History   Socioeconomic History  . Marital status: Married    Spouse name: Not on file  . Number of children: Not on file  . Years of education: Not on file  . Highest education level: Not on file  Social Needs  . Financial resource strain: Not on file  . Food insecurity - worry: Not on file  . Food insecurity - inability: Not on file  . Transportation needs - medical: Not  on file  . Transportation needs - non-medical: Not on file  Occupational History  . 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 Smoking status: Never Smoker  . Smokeless tobacco: Current User    Types: Snuff  Substance and Sexual Activity  . Alcohol use: No  . Drug use: No  . Sexual activity: No    Birth control/protection: Surgical  Other Topics Concern  . Not on file  Social History Narrative  . Not on file    FAMILY HISTORY Family History  Problem Relation Age of Onset  . Aneurysm Father     ALLERGIES:  is allergic to other; iohexol; ivp dye [iodinated diagnostic agents]; [place] [sitagliptin]; latex; motrin [ibuprofen]; nsaids; statins; sulfa antibiotics; and adhesive [tape].  MEDICATIONS:  Current Outpatient Medications  Medication Sig Dispense Refill  . albuterol (PROVENTIL) (2.5 MG/3ML) 0.083% nebulizer solution Take 2.5 mg by nebulization every 6 (six) hours as needed for wheezing or shortness of breath.     . ALPRAZolam (XANAX) 1 MG tablet Take 1 mg 3 (three) times daily as needed by mouth for anxiety.     Marland Kitchen azithromycin (ZITHROMAX) 250 MG tablet TAKE TWO (2) TABLETS ON FIRST DAY, THEN TAKE ONE (1) TABLET EACH DAY FOR THE NEXT 4 DAYS.  0  . Camphor-Eucalyptus-Menthol (VICKS VAPORUB EX) Apply 1 application daily as needed topically (congestion).    . Cholecalciferol (VITAMIN D3) 5000 units CAPS Take 5,000 Units by mouth daily.    . cyclophosphamide (CYTOXAN) 2 g chemo injection Inject once into the vein. Every 2 weeks for 4 cycles    . dexamethasone (DECADRON) 4 MG tablet Take 2 tablets by mouth once a day on the day after chemotherapy and then take 2 tablets two times a day for 2 days. Take with food. 30 tablet 1  . DOXOrubicin HCl (ADRIAMYCIN IV) Inject into the vein. Every 2 weeks for 4 cycles    . HYDROcodone-acetaminophen (NORCO) 5-325 MG tablet Take 1 tablet every 6 (six) hours as needed by mouth for moderate pain. 20  tablet 0  . hydrOXYzine (ATARAX/VISTARIL) 50 MG tablet Take 50 mg by mouth at bedtime.     . Lidocaine 0.5 % GEL Apply 1 application topically as needed (for pain).    Marland Kitchen lidocaine-prilocaine (EMLA) cream Apply to affected area once 30 g 3  . metFORMIN (GLUCOPHAGE) 500 MG tablet Take 500 mg by mouth 2 (two) times daily with a meal.     . nitroGLYCERIN (NITROSTAT) 0.4 MG SL tablet Place 0.4 mg under the tongue every 5 (five) minutes as needed for chest pain.    Marland Kitchen omeprazole (PRILOSEC) 40 MG capsule Take 40 mg every other day by mouth. At night    . ondansetron (ZOFRAN) 8 MG tablet Take 1 tablet (8 mg total) 2 (two)  times daily as needed by mouth. Start on the third day after chemotherapy. 30 tablet 1  . Pegfilgrastim (NEULASTA ONPRO [place]) Inject into the skin. For 4 cycles    . pravastatin (PRAVACHOL) 40 MG tablet Take 40 mg by mouth daily.    . prochlorperazine (COMPAZINE) 10 MG tablet Take 1 tablet (10 mg total) every 6 (six) hours as needed by mouth (Nausea or vomiting). 30 tablet 1  . promethazine (PHENERGAN) 25 MG tablet Take 25 mg by mouth every 6 (six) hours as needed for nausea or vomiting.    . Pseudoeph-Doxylamine-DM-APAP (NYQUIL PO) Take 2 tablets at bedtime as needed by mouth (sinus headaches).     No current facility-administered medications for this visit.     PHYSICAL EXAMINATION:  ECOG PERFORMANCE STATUS: 0 - Asymptomatic  Pressure 134/82, pulse 90, respiratory rate 16, temperature 97.8, O2 sat 97%, Wt 272.5 lbs, height 60.7 in.  Physical Exam Constitutional: Well-developed, well-nourished, in no acute distress, tired looking.   HENT:  Head: Normocephalic and atraumatic.  Mouth/Throat: No oropharyngeal exudate. Mucosa moist. Eyes: Pupils are equal, round, and reactive to light. Conjunctivae are normal. No scleral icterus.  Neck: Normal range of motion. Neck supple. No JVD present.  Cardiovascular: Normal rate, regular rhythm and normal heart sounds.  Exam reveals no gallop  and no friction rub.   No murmur heard. Pulmonary/Chest: Effort normal and breath sounds normal. No respiratory distress. No wheezes.No rales.  Abdominal: Soft. Bowel sounds are normal. No distension. There is no tenderness. There is no guarding.  Musculoskeletal: No edema or tenderness.  Lymphadenopathy:    No cervical or supraclavicular adenopathy.  Neurological: Alert and oriented to person, place, and time. No cranial nerve deficit.  Skin: Skin is warm and dry. No rash noted. No erythema. No pallor.  Psychiatric: Affect and judgment normal.  Breast: Left mastectomy site well healed. Right breast without masses, nipple discharge, axillary lymphadenopathy.    LABORATORY DATA: I have personally reviewed the data as listed:  Infusion on 11/05/2017  Component Date Value Ref Range Status  . Sodium 11/05/2017 137  135 - 145 mmol/L Final  . Potassium 11/05/2017 3.8  3.5 - 5.1 mmol/L Final  . Chloride 11/05/2017 103  101 - 111 mmol/L Final  . CO2 11/05/2017 24  22 - 32 mmol/L Final  . Glucose, Bld 11/05/2017 180* 65 - 99 mg/dL Final  . BUN 11/05/2017 15  6 - 20 mg/dL Final  . Creatinine, Ser 11/05/2017 0.97  0.44 - 1.00 mg/dL Final  . Calcium 11/05/2017 9.2  8.9 - 10.3 mg/dL Final  . Total Protein 11/05/2017 6.9  6.5 - 8.1 g/dL Final  . Albumin 11/05/2017 3.6  3.5 - 5.0 g/dL Final  . AST 11/05/2017 25  15 - 41 U/L Final  . ALT 11/05/2017 27  14 - 54 U/L Final  . Alkaline Phosphatase 11/05/2017 86  38 - 126 U/L Final  . Total Bilirubin 11/05/2017 0.4  0.3 - 1.2 mg/dL Final  . GFR calc non Af Amer 11/05/2017 59* >60 mL/min Final  . GFR calc Af Amer 11/05/2017 >60  >60 mL/min Final   Comment: (NOTE) The eGFR has been calculated using the CKD EPI equation. This calculation has not been validated in all clinical situations. eGFR's persistently <60 mL/min signify possible Chronic Kidney Disease.   . Anion gap 11/05/2017 10  5 - 15 Final  . WBC 11/05/2017 10.3  4.0 - 10.5 K/uL Final  .  RBC 11/05/2017 4.39  3.87 -  5.11 MIL/uL Final  . Hemoglobin 11/05/2017 12.7  12.0 - 15.0 g/dL Final  . HCT 11/05/2017 38.9  36.0 - 46.0 % Final  . MCV 11/05/2017 88.6  78.0 - 100.0 fL Final  . MCH 11/05/2017 28.9  26.0 - 34.0 pg Final  . MCHC 11/05/2017 32.6  30.0 - 36.0 g/dL Final  . RDW 11/05/2017 14.0  11.5 - 15.5 % Final  . Platelets 11/05/2017 199  150 - 400 K/uL Final  . Neutrophils Relative % 11/05/2017 61  % Final  . Neutro Abs 11/05/2017 6.3  1.7 - 7.7 K/uL Final  . Lymphocytes Relative 11/05/2017 32  % Final  . Lymphs Abs 11/05/2017 3.3  0.7 - 4.0 K/uL Final  . Monocytes Relative 11/05/2017 6  % Final  . Monocytes Absolute 11/05/2017 0.6  0.1 - 1.0 K/uL Final  . Eosinophils Relative 11/05/2017 0  % Final  . Eosinophils Absolute 11/05/2017 0.0  0.0 - 0.7 K/uL Final  . Basophils Relative 11/05/2017 1  % Final  . Basophils Absolute 11/05/2017 0.1  0.0 - 0.1 K/uL Final  Appointment on 10/27/2017  Component Date Value Ref Range Status  . WBC 10/27/2017 2.7* 4.0 - 10.5 K/uL Final  . RBC 10/27/2017 4.44  3.87 - 5.11 MIL/uL Final  . Hemoglobin 10/27/2017 12.7  12.0 - 15.0 g/dL Final  . HCT 10/27/2017 38.8  36.0 - 46.0 % Final  . MCV 10/27/2017 87.4  78.0 - 100.0 fL Final  . MCH 10/27/2017 28.6  26.0 - 34.0 pg Final  . MCHC 10/27/2017 32.7  30.0 - 36.0 g/dL Final  . RDW 10/27/2017 13.3  11.5 - 15.5 % Final  . Platelets 10/27/2017 169  150 - 400 K/uL Final  . Neutrophils Relative % 10/27/2017 7  % Final  . Lymphocytes Relative 10/27/2017 83  % Final  . Monocytes Relative 10/27/2017 3  % Final  . Eosinophils Relative 10/27/2017 7  % Final  . Basophils Relative 10/27/2017 0  % Final  . Neutro Abs 10/27/2017 0.2* 1.7 - 7.7 K/uL Final  . Lymphs Abs 10/27/2017 2.2  0.7 - 4.0 K/uL Final  . Monocytes Absolute 10/27/2017 0.1  0.1 - 1.0 K/uL Final  . Eosinophils Absolute 10/27/2017 0.2  0.0 - 0.7 K/uL Final  . Basophils Absolute 10/27/2017 0.0  0.0 - 0.1 K/uL Final  . Sodium 10/27/2017  133* 135 - 145 mmol/L Final  . Potassium 10/27/2017 4.0  3.5 - 5.1 mmol/L Final  . Chloride 10/27/2017 100* 101 - 111 mmol/L Final  . CO2 10/27/2017 23  22 - 32 mmol/L Final  . Glucose, Bld 10/27/2017 132* 65 - 99 mg/dL Final  . BUN 10/27/2017 14  6 - 20 mg/dL Final  . Creatinine, Ser 10/27/2017 0.80  0.44 - 1.00 mg/dL Final  . Calcium 10/27/2017 9.2  8.9 - 10.3 mg/dL Final  . Total Protein 10/27/2017 6.8  6.5 - 8.1 g/dL Final  . Albumin 10/27/2017 3.5  3.5 - 5.0 g/dL Final  . AST 10/27/2017 22  15 - 41 U/L Final  . ALT 10/27/2017 26  14 - 54 U/L Final  . Alkaline Phosphatase 10/27/2017 106  38 - 126 U/L Final  . Total Bilirubin 10/27/2017 0.4  0.3 - 1.2 mg/dL Final  . GFR calc non Af Amer 10/27/2017 >60  >60 mL/min Final  . GFR calc Af Amer 10/27/2017 >60  >60 mL/min Final   Comment: (NOTE) The eGFR has been calculated using the CKD EPI equation. This calculation has not  been validated in all clinical situations. eGFR's persistently <60 mL/min signify possible Chronic Kidney Disease.   . Anion gap 10/27/2017 10  5 - 15 Final    RADIOGRAPHIC STUDIES: I have personally reviewed the radiological images as listed and agree with the findings in the report  No results found.  PATHOLOGY: Patient: GALEN, MALKOWSKI Collected: 09/15/2017 Client: [facility] Accession: [id number] Received: 09/15/2017 Aviva Signs DOB: 04/17/1951 Age: 50 Gender: F Reported: 09/16/2017 [address] Patient Ph: [phone] MRN #: [id number] [place], [place] [address] Visit #: [id number] Chart #: Phone: [phone] Fax: CC: REPORT OF SURGICAL PATHOLOGY FINAL DIAGNOSIS Diagnosis Breast, modified radical mastectomy , left - INVASIVE DUCTAL CARCINOMA, GRADE 3, SPANNING 1.8 CM. - HIGH GRADE DUCTAL CARCINOMA IN SITU. - RESECTION MARGINS ARE NEGATIVE. - EIGHT OF EIGHT LYMPH NODES NEGATIVE FOR CARCINOMA (0/8). - SEE ONCOLOGY TABLE. Microscopic Comment BREAST, INVASIVE TUMOR Procedure: Left  modified radical mastectomy. Laterality: Left. Tumor Size: 1.8 cm. Histologic Type: Invasive ductal carcinoma. Grade: 3 Tubular Differentiation: 3 Nuclear Pleomorphism: 2 Mitotic Count: 3 Ductal Carcinoma in Situ (DCIS): Present, high grade. Extent of Tumor: Confined to breast parenchyma. Margins: Invasive carcinoma, distance from closest margin: 2 cm (posterior). DCIS, distance from closest margin: 2 cm (posterior). Regional Lymph Nodes: Number of Lymph Nodes Examined: 8 Number of Sentinel Lymph Nodes Examined: 0 Lymph Nodes with Macrometastases: 0 Lymph Nodes with Micrometastases: 0 Lymph Nodes with Isolated Tumor Cells: 0 Breast Prognostic Profile: Performed on biopsy [id number], see below. Will not be repeated. Estrogen Receptor: Negative. Progesterone Receptor: Negative. Her2: Negative (1.30 ratio). 1 of 3 FINAL for TEREASA, YILMAZ ([id number]) Microscopic Comment(continued) Ki-67: 15% Best tumor block for sendout testing: 1D Pathologic Stage Classification (pTNM, AJCC 8th Edition): Primary Tumor (pT): pT1c Regional Lymph Nodes (pN): pN0 Distant Metastases (pM): pMX Comments: None. Vicente Males MD Pathologist, Electronic Signature   ASSESSMENT/PLAN Cancer Staging Malignant neoplasm of upper-outer quadrant of left female breast [facility]) Staging form: Breast, AJCC 8th Edition - Clinical: cT1c, cN0 - Unsigned - Pathologic: Stage IB (pT1c, pN0, cM0, G3, ER: Negative, PR: Negative, HER2: Negative) - Signed by Twana First, MD on 10/09/2017  -Patient had severe fatigue with cycle 1 of AC, therefore I will decrease her chemo by 25%. -Encouraged her to drink Boost/Ensure to supplement, we have given her some today. -RTC as scheduled on 11/03/17 for cycle 2 of AC. -RTC for follow up with cycle 3 of AC.    All questions were answered. The patient knows to call the clinic with any problems, questions or concerns.  This note was electronically signed.    Jacquelin Hawking, NP  11/14/2017 1:05 PM

## 2017-11-15 ENCOUNTER — Encounter: Payer: Self-pay | Admitting: Oncology

## 2017-11-20 ENCOUNTER — Ambulatory Visit (HOSPITAL_COMMUNITY): Payer: Medicare Other | Admitting: Oncology

## 2017-11-20 NOTE — Progress Notes (Addendum)
[facility] Initial Visit:  Patient Care Team: Lucia Gaskins, MD as PCP - General (Internal Medicine)  CHIEF COMPLAINTS/PURPOSE OF CONSULTATION:  Triple negative stage IB left breast cancer with high grade DCIS.  HISTORY OF PRESENTING ILLNESS: Kelli Wilson 66 y.o. postmenopausal female is here with her dauther for evaluation of triple negative high grade invasive ductal carcinoma of the left breast with high grade DCIS. Patient was referred by Dr. Arnoldo Morale.  Patient was found to have a mass in the upper outer quadrant on screening mammogram.  Core biopsy was performed which revealed invasive carcinoma.  No additional mammograms are available.  Dr. Arnoldo Morale performed a left modified radical mastectomy with lymph node dissection on 09/15/2017.  Surgical path demonstrated invasive ductal carcinoma, grade 3, spanning 1.8 cm, high-grade ductal carcinoma in situ, resection margins were negative, 8 out of 8 lymph nodes were negative for carcinoma.  Patient states that she was adopted but knew her birth mother.  Her birth mother did not have breast cancer.  She states she had a half sister who had breast cancer in her 106s.  Patient states that she tolerated surgery well.  Mild swelling at surgical site and down her left arm noted.  She denied any chest pain, shortness breath, abdominal pain, focal weakness.  She denied any nausea, vomiting, decreased appetite, weight changes. She stated she had chronic baseline diarrhea due to having her gallbladder removed.  She also had some mild neuropathy in her fingertips and toes due to diabetes.  Patient was last seen by Dr. Talbert Cage on 10/27/2017 to evaluate tolerability of cycle 1 of Cytoxan and Adriamycin that was given on 10/20/2017. She noted to have severe fatigue and had spent most of her time in bed post chemotherapy. She noted a metallic taste in her mouth but denied nausea vomiting diarrhea.  She noted a change in her appetite because  she was so tired she was unable to get up to prepare food for herself.  She denied chest pain shortness of breath abdominal pain or focal weakness.  She was finishing up a Z-Pak for laryngitis prescribed by PCP.  Patient was to return to clinic for cycle 2 of AC on 11/05/2017 and then for clinic visit prior to cycle 3 of AC.  Cycle 2 of AC was dose reduced by 25% due to severe fatigue.  Patient given on pro-Neulasta after each cycle.  INTERVAL HISTORY:  Patient presents today prior to cycle 3 of Adriamycin and Cytoxan. She has finished her z-pak for laryngitis. She has a 2 lb weight gain. She states that she continues to have a metallic taste in mouth that she is able to eat without issue.  She denies mucositis.   She does complain of diarrhea for the past 2 days that is relieved with Imodium.  Notes left arm swelling.  She unfortunately was unable to be seen in the lymphedema clinic and wishes to be evaluated there ASAP.  Patient notes chest pain approximately 1 week ago after chemotherapy where she felt "heaviness" on her chest and felt "she might pass out".  She took one nitroglycerin with relief. She has a prescription for nitroglycerin as needed for angina.  She has not had to take any more nitro since then.  She did not notify her cardiologist.  Notes occasional shortness of breath and rapid heart rate.  Notes mild leg swelling relieved with elevation and occasional hot flashes.  She denies fevers or chills.  Review of Systems  Constitutional: Positive for appetite change and fatigue. Negative for chills, diaphoresis and fever.  HENT:  Negative.   Eyes: Negative.   Respiratory: Positive for shortness of breath. Negative for cough.   Cardiovascular: Positive for chest pain, leg swelling and palpitations.  Gastrointestinal: Positive for diarrhea.  Endocrine: Positive for hot flashes.  Genitourinary: Negative.    Musculoskeletal: Negative.   Skin: Negative.   Neurological: Positive for dizziness.   Hematological: Negative.   Psychiatric/Behavioral: Negative.    ROS as per HPI otherwise 12 point ROS is negative.  MEDICAL HISTORY: Past Medical History:  Diagnosis Date  . Anxiety   . Arthritis   . Back pain   . 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 Chest pain   . Chronic pain   . Depression   . Diabetes mellitus without complication ([place])   . Fibromyalgia   . GERD (gastroesophageal reflux disease)   . Hypercholesteremia   . Hypertension   . Knee pain   . Obesity   . Peptic ulcer    history of - normal GI studies Sept 2010    SURGICAL HISTORY: Past Surgical History:  Procedure Laterality Date  . APPENDECTOMY    . bladder rectal repair    . BREAST SURGERY Left    breast biopsy- benign  . CHOLECYSTECTOMY    . DILATION AND CURETTAGE OF UTERUS    . GANGLION CYST EXCISION Left   . MASTECTOMY MODIFIED RADICAL Left 09/15/2017   Procedure: LEFT MODIFIED RADICAL MASTECTOMY;  Surgeon: Aviva Signs, MD;  Location: [facility];  Service: General;  Laterality: Left;  . PORTACATH PLACEMENT Right 10/13/2017   Procedure: INSERTION PORT-A-CATH RIGHT SUBCLAVIAN;  Surgeon: Aviva Signs, MD;  Location: [facility];  Service: General;  Laterality: Right;  . TOTAL ABDOMINAL HYSTERECTOMY      SOCIAL HISTORY: Social History   Socioeconomic History  . Marital status: Married    Spouse name: Not on file  . Number of children: Not on file  . Years of education: Not on file  . Highest education level: Not on file  Social Needs  . Financial resource strain: Not on file  . Food insecurity - worry: Not on file  . Food insecurity - inability: Not on file  . Transportation needs - medical: Not on file  . Transportation needs - non-medical: Not on file  Occupational History  . 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 Smoking status: Never Smoker  . Smokeless tobacco: Current User    Types: Snuff  Substance and Sexual  Activity  . Alcohol use: No  . Drug use: No  . Sexual activity: No    Birth control/protection: Surgical  Other Topics Concern  . Not on file  Social History Narrative  . Not on file    FAMILY HISTORY Family History  Problem Relation Age of Onset  . Aneurysm Father     ALLERGIES:  is allergic to magnesium sulfate; other; iohexol; ivp dye [iodinated diagnostic agents]; januvia [sitagliptin]; latex; motrin [ibuprofen]; nsaids; statins; sulfa antibiotics; and adhesive [tape].  MEDICATIONS:  Current Outpatient Medications  Medication Sig Dispense Refill  . albuterol (PROVENTIL) (2.5 MG/3ML) 0.083% nebulizer solution Take 2.5 mg by nebulization every 6 (six) hours as needed for wheezing or shortness of breath.     . ALPRAZolam (XANAX) 1 MG tablet Take 1 mg 3 (three) times daily as needed by mouth for anxiety.     Marland Kitchen  azithromycin (ZITHROMAX) 250 MG tablet TAKE TWO (2) TABLETS ON FIRST DAY, THEN TAKE ONE (1) TABLET EACH DAY FOR THE NEXT 4 DAYS.  0  . Camphor-Eucalyptus-Menthol (VICKS VAPORUB EX) Apply 1 application daily as needed topically (congestion).    . Cholecalciferol (VITAMIN D3) 5000 units CAPS Take 5,000 Units by mouth daily.    . cyclophosphamide (CYTOXAN) 2 g chemo injection Inject once into the vein. Every 2 weeks for 4 cycles    . dexamethasone (DECADRON) 4 MG tablet Take 2 tablets by mouth once a day on the day after chemotherapy and then take 2 tablets two times a day for 2 days. Take with food. 30 tablet 1  . DOXOrubicin HCl (ADRIAMYCIN IV) Inject into the vein. Every 2 weeks for 4 cycles    . HYDROcodone-acetaminophen (NORCO) 7.5-325 MG tablet Take 1 tablet by mouth 4 (four) times daily.  0  . hydrOXYzine (ATARAX/VISTARIL) 50 MG tablet Take 50 mg by mouth at bedtime.     . Lidocaine 0.5 % GEL Apply 1 application topically as needed (for pain).    Marland Kitchen lidocaine-prilocaine (EMLA) cream Apply to affected area once 30 g 3  . metFORMIN (GLUCOPHAGE) 500 MG tablet Take 500 mg by  mouth 2 (two) times daily with a meal.     . nitroGLYCERIN (NITROSTAT) 0.4 MG SL tablet Place 0.4 mg under the tongue every 5 (five) minutes as needed for chest pain.    Marland Kitchen omeprazole (PRILOSEC) 40 MG capsule Take 40 mg every other day by mouth. At night    . ondansetron (ZOFRAN) 8 MG tablet Take 1 tablet (8 mg total) 2 (two) times daily as needed by mouth. Start on the third day after chemotherapy. 30 tablet 1  . Pegfilgrastim (NEULASTA ONPRO [place]) Inject into the skin. For 4 cycles    . pravastatin (PRAVACHOL) 40 MG tablet Take 40 mg by mouth daily.    . prochlorperazine (COMPAZINE) 10 MG tablet Take 1 tablet (10 mg total) every 6 (six) hours as needed by mouth (Nausea or vomiting). 30 tablet 1  . promethazine (PHENERGAN) 25 MG tablet Take 25 mg by mouth every 6 (six) hours as needed for nausea or vomiting.    . Pseudoeph-Doxylamine-DM-APAP (NYQUIL PO) Take 2 tablets at bedtime as needed by mouth (sinus headaches).    . diphenoxylate-atropine (LOMOTIL) 2.5-0.025 MG tablet Take 1 tablet by mouth 4 (four) times daily as needed for diarrhea or loose stools. 30 tablet 0   No current facility-administered medications for this visit.     PHYSICAL EXAMINATION:  ECOG PERFORMANCE STATUS: 0 - Asymptomatic  Pressure 134/82, pulse 90, respiratory rate 16, temperature 97.8, O2 sat 97%, Wt 272.5 lbs, height 60.7 in.  Physical Exam  Constitutional: She is oriented to person, place, and time and well-developed, well-nourished, and in no distress.  HENT:  Head: Normocephalic and atraumatic.  Eyes: Pupils are equal, round, and reactive to light.  Neck: Normal range of motion. Neck supple.  Cardiovascular: Regular rhythm and intact distal pulses. Frequent extrasystoles are present.  Occasional PVC  Pulmonary/Chest: Effort normal and breath sounds normal.  Abdominal: Soft. Bowel sounds are normal. There is no tenderness.  Musculoskeletal: Normal range of motion. She exhibits edema.  Lymphadenopathy:  Left  arm swelling from surgery-mastectomy and lymph node removal  Neurological: She is alert and oriented to person, place, and time.  Skin: Skin is warm and dry.   Constitutional: Well-developed, well-nourished, in no acute distress, tired looking.   HENT:  Head: Normocephalic  and atraumatic.  Mouth/Throat: No oropharyngeal exudate. Mucosa moist. Eyes: Pupils are equal, round, and reactive to light. Conjunctivae are normal. No scleral icterus.  Neck: Normal range of motion. Neck supple. No JVD present.  Cardiovascular: Normal rate, regular rhythm and normal heart sounds.  Exam reveals no gallop and no friction rub.   No murmur heard. Pulmonary/Chest: Effort normal and breath sounds normal. No respiratory distress. No wheezes.No rales.  Abdominal: Soft. Bowel sounds are normal. No distension. There is no tenderness. There is no guarding.  Musculoskeletal: No edema or tenderness.  Lymphadenopathy:    No cervical or supraclavicular adenopathy.  Neurological: Alert and oriented to person, place, and time. No cranial nerve deficit.  Skin: Skin is warm and dry. No rash noted. No erythema. No pallor.  Psychiatric: Affect and judgment normal.  Breast: Left mastectomy site well healed. Right breast without masses, nipple discharge, axillary lymphadenopathy.    LABORATORY DATA: I have personally reviewed the data as listed:  Hospital Outpatient Visit on 11/21/2017  Component Date Value Ref Range Status  . Weight 11/21/2017 4,428.8  oz Final  . BP 11/21/2017 176/96  mmHg Final  . LV PW d 11/21/2017 13* 0.6 - 1.1 mm Final  . FS 11/21/2017 36  28 - 44 % Final  . LA ID, A-P, ES 11/21/2017 35  mm Final  . LA diam end sys 11/21/2017 35.00  mm Final  . LA vol 11/21/2017 47.4  mL Final  . LA vol index 11/21/2017 18.9  mL/m2 Final  . LV sys vol 11/21/2017 22  mL Final  . LV sys vol index 11/21/2017 9.0  mL/m2 Final  . LV dias vol 11/21/2017 64  46 - 106 mL Final  . LV dias vol index 11/21/2017 25.0   mL/m2 Final  . Simpson's disk 11/21/2017 66.00   Final  . IVS/LV PW RATIO, ED 11/21/2017 1.05   Final  . Stroke v 11/21/2017 42  ml Final  . LVOT diameter 11/21/2017 22  mm Final  . LVOT VTI 11/21/2017 21.6  cm Final  . LV e' LATERAL 11/21/2017 8.92  cm/s Final  . LV E/e' medial 11/21/2017 9.19   Final  . LV E/e'average 11/21/2017 9.19   Final  . LA diam index 11/21/2017 1.4  cm/m2 Final  . LA vol A4C 11/21/2017 42.3  ml Final  . LVOT peak grad rest 11/21/2017 6  mmHg Final  . E decel time 11/21/2017 264  msec Final  . LVOT area 11/21/2017 3.80  cm2 Final  . LVOT peak vel 11/21/2017 121  cm/s Final  . LVOT SV 11/21/2017 82.00  mL Final  . Peak grad 11/21/2017 3  mmHg Final  . E/e' ratio 11/21/2017 9.19   Final  . MV pk E vel 11/21/2017 82  m/s Final  . MV pk A vel 11/21/2017 114  m/s Final  . MV Dec 11/21/2017 264   Final  . TDI e' medial 11/21/2017 6.64   Final  . TDI e' lateral 11/21/2017 8.92   Final  . Lateral S' vel 11/21/2017 12.10  cm/sec Final  . TAPSE 11/21/2017 15.60  mm Final  Infusion on 11/21/2017  Component Date Value Ref Range Status  . Sodium 11/21/2017 137  135 - 145 mmol/L Final  . Potassium 11/21/2017 3.7  3.5 - 5.1 mmol/L Final  . Chloride 11/21/2017 101  101 - 111 mmol/L Final  . CO2 11/21/2017 23  22 - 32 mmol/L Final  . Glucose, Bld 11/21/2017 143* 65 -  99 mg/dL Final  . BUN 11/21/2017 11  6 - 20 mg/dL Final  . Creatinine, Ser 11/21/2017 0.85  0.44 - 1.00 mg/dL Final  . Calcium 11/21/2017 9.2  8.9 - 10.3 mg/dL Final  . Total Protein 11/21/2017 6.8  6.5 - 8.1 g/dL Final  . Albumin 11/21/2017 3.7  3.5 - 5.0 g/dL Final  . AST 11/21/2017 25  15 - 41 U/L Final  . ALT 11/21/2017 25  14 - 54 U/L Final  . Alkaline Phosphatase 11/21/2017 99  38 - 126 U/L Final  . Total Bilirubin 11/21/2017 0.4  0.3 - 1.2 mg/dL Final  . GFR calc non Af Amer 11/21/2017 >60  >60 mL/min Final  . GFR calc Af Amer 11/21/2017 >60  >60 mL/min Final   Comment: (NOTE) The eGFR has been  calculated using the CKD EPI equation. This calculation has not been validated in all clinical situations. eGFR's persistently <60 mL/min signify possible Chronic Kidney Disease.   . Anion gap 11/21/2017 13  5 - 15 Final  . WBC 11/21/2017 13.2* 4.0 - 10.5 K/uL Final  . RBC 11/21/2017 4.13  3.87 - 5.11 MIL/uL Final  . Hemoglobin 11/21/2017 12.0  12.0 - 15.0 g/dL Final  . HCT 11/21/2017 36.1  36.0 - 46.0 % Final  . MCV 11/21/2017 87.4  78.0 - 100.0 fL Final  . MCH 11/21/2017 29.1  26.0 - 34.0 pg Final  . MCHC 11/21/2017 33.2  30.0 - 36.0 g/dL Final  . RDW 11/21/2017 15.1  11.5 - 15.5 % Final  . Platelets 11/21/2017 113* 150 - 400 K/uL Final   Comment: PLATELET COUNT CONFIRMED BY SMEAR SPECIMEN CHECKED FOR CLOTS   . Neutrophils Relative % 11/21/2017 64  % Final  . Neutro Abs 11/21/2017 8.4  1.7 - 7.7 K/uL Final  . Lymphocytes Relative 11/21/2017 28  % Final  . Lymphs Abs 11/21/2017 3.8  0.7 - 4.0 K/uL Final  . Monocytes Relative 11/21/2017 7  % Final  . Monocytes Absolute 11/21/2017 1.0  0.1 - 1.0 K/uL Final  . Eosinophils Relative 11/21/2017 0  % Final  . Eosinophils Absolute 11/21/2017 0.0  0.0 - 0.7 K/uL Final  . Basophils Relative 11/21/2017 1  % Final  . Basophils Absolute 11/21/2017 0.1  0.0 - 0.1 K/uL Final  . Magnesium 11/21/2017 1.3* 1.7 - 2.4 mg/dL Final  Infusion on 11/05/2017  Component Date Value Ref Range Status  . Sodium 11/05/2017 137  135 - 145 mmol/L Final  . Potassium 11/05/2017 3.8  3.5 - 5.1 mmol/L Final  . Chloride 11/05/2017 103  101 - 111 mmol/L Final  . CO2 11/05/2017 24  22 - 32 mmol/L Final  . Glucose, Bld 11/05/2017 180* 65 - 99 mg/dL Final  . BUN 11/05/2017 15  6 - 20 mg/dL Final  . Creatinine, Ser 11/05/2017 0.97  0.44 - 1.00 mg/dL Final  . Calcium 11/05/2017 9.2  8.9 - 10.3 mg/dL Final  . Total Protein 11/05/2017 6.9  6.5 - 8.1 g/dL Final  . Albumin 11/05/2017 3.6  3.5 - 5.0 g/dL Final  . AST 11/05/2017 25  15 - 41 U/L Final  . ALT 11/05/2017 27   14 - 54 U/L Final  . Alkaline Phosphatase 11/05/2017 86  38 - 126 U/L Final  . Total Bilirubin 11/05/2017 0.4  0.3 - 1.2 mg/dL Final  . GFR calc non Af Amer 11/05/2017 59* >60 mL/min Final  . GFR calc Af Amer 11/05/2017 >60  >60 mL/min Final   Comment: (  NOTE) The eGFR has been calculated using the CKD EPI equation. This calculation has not been validated in all clinical situations. eGFR's persistently <60 mL/min signify possible Chronic Kidney Disease.   . Anion gap 11/05/2017 10  5 - 15 Final  . WBC 11/05/2017 10.3  4.0 - 10.5 K/uL Final  . RBC 11/05/2017 4.39  3.87 - 5.11 MIL/uL Final  . Hemoglobin 11/05/2017 12.7  12.0 - 15.0 g/dL Final  . HCT 11/05/2017 38.9  36.0 - 46.0 % Final  . MCV 11/05/2017 88.6  78.0 - 100.0 fL Final  . MCH 11/05/2017 28.9  26.0 - 34.0 pg Final  . MCHC 11/05/2017 32.6  30.0 - 36.0 g/dL Final  . RDW 11/05/2017 14.0  11.5 - 15.5 % Final  . Platelets 11/05/2017 199  150 - 400 K/uL Final  . Neutrophils Relative % 11/05/2017 61  % Final  . Neutro Abs 11/05/2017 6.3  1.7 - 7.7 K/uL Final  . Lymphocytes Relative 11/05/2017 32  % Final  . Lymphs Abs 11/05/2017 3.3  0.7 - 4.0 K/uL Final  . Monocytes Relative 11/05/2017 6  % Final  . Monocytes Absolute 11/05/2017 0.6  0.1 - 1.0 K/uL Final  . Eosinophils Relative 11/05/2017 0  % Final  . Eosinophils Absolute 11/05/2017 0.0  0.0 - 0.7 K/uL Final  . Basophils Relative 11/05/2017 1  % Final  . Basophils Absolute 11/05/2017 0.1  0.0 - 0.1 K/uL Final  Appointment on 10/27/2017  Component Date Value Ref Range Status  . WBC 10/27/2017 2.7* 4.0 - 10.5 K/uL Final  . RBC 10/27/2017 4.44  3.87 - 5.11 MIL/uL Final  . Hemoglobin 10/27/2017 12.7  12.0 - 15.0 g/dL Final  . HCT 10/27/2017 38.8  36.0 - 46.0 % Final  . MCV 10/27/2017 87.4  78.0 - 100.0 fL Final  . MCH 10/27/2017 28.6  26.0 - 34.0 pg Final  . MCHC 10/27/2017 32.7  30.0 - 36.0 g/dL Final  . RDW 10/27/2017 13.3  11.5 - 15.5 % Final  . Platelets 10/27/2017 169   150 - 400 K/uL Final  . Neutrophils Relative % 10/27/2017 7  % Final  . Lymphocytes Relative 10/27/2017 83  % Final  . Monocytes Relative 10/27/2017 3  % Final  . Eosinophils Relative 10/27/2017 7  % Final  . Basophils Relative 10/27/2017 0  % Final  . Neutro Abs 10/27/2017 0.2* 1.7 - 7.7 K/uL Final  . Lymphs Abs 10/27/2017 2.2  0.7 - 4.0 K/uL Final  . Monocytes Absolute 10/27/2017 0.1  0.1 - 1.0 K/uL Final  . Eosinophils Absolute 10/27/2017 0.2  0.0 - 0.7 K/uL Final  . Basophils Absolute 10/27/2017 0.0  0.0 - 0.1 K/uL Final  . Sodium 10/27/2017 133* 135 - 145 mmol/L Final  . Potassium 10/27/2017 4.0  3.5 - 5.1 mmol/L Final  . Chloride 10/27/2017 100* 101 - 111 mmol/L Final  . CO2 10/27/2017 23  22 - 32 mmol/L Final  . Glucose, Bld 10/27/2017 132* 65 - 99 mg/dL Final  . BUN 10/27/2017 14  6 - 20 mg/dL Final  . Creatinine, Ser 10/27/2017 0.80  0.44 - 1.00 mg/dL Final  . Calcium 10/27/2017 9.2  8.9 - 10.3 mg/dL Final  . Total Protein 10/27/2017 6.8  6.5 - 8.1 g/dL Final  . Albumin 10/27/2017 3.5  3.5 - 5.0 g/dL Final  . AST 10/27/2017 22  15 - 41 U/L Final  . ALT 10/27/2017 26  14 - 54 U/L Final  . Alkaline Phosphatase 10/27/2017 106  38 - 126 U/L Final  . Total Bilirubin 10/27/2017 0.4  0.3 - 1.2 mg/dL Final  . GFR calc non Af Amer 10/27/2017 >60  >60 mL/min Final  . GFR calc Af Amer 10/27/2017 >60  >60 mL/min Final   Comment: (NOTE) The eGFR has been calculated using the CKD EPI equation. This calculation has not been validated in all clinical situations. eGFR's persistently <60 mL/min signify possible Chronic Kidney Disease.   . Anion gap 10/27/2017 10  5 - 15 Final    RADIOGRAPHIC STUDIES: I have personally reviewed the radiological images as listed and agree with the findings in the report  No results found.  PATHOLOGY: Patient: DELA, SWEENY Collected: 09/15/2017 Client: [facility] Accession: [id number] Received: 09/15/2017 Aviva Signs DOB: 04-Jun-1951  Age: 35 Gender: F Reported: 09/16/2017 [address] Patient Ph: [phone] MRN #: [id number] [place], [place] [address] Visit #: [id number] Chart #: Phone: [phone] Fax: CC: REPORT OF SURGICAL PATHOLOGY FINAL DIAGNOSIS Diagnosis Breast, modified radical mastectomy , left - INVASIVE DUCTAL CARCINOMA, GRADE 3, SPANNING 1.8 CM. - HIGH GRADE DUCTAL CARCINOMA IN SITU. - RESECTION MARGINS ARE NEGATIVE. - EIGHT OF EIGHT LYMPH NODES NEGATIVE FOR CARCINOMA (0/8). - SEE ONCOLOGY TABLE. Microscopic Comment BREAST, INVASIVE TUMOR Procedure: Left modified radical mastectomy. Laterality: Left. Tumor Size: 1.8 cm. Histologic Type: Invasive ductal carcinoma. Grade: 3 Tubular Differentiation: 3 Nuclear Pleomorphism: 2 Mitotic Count: 3 Ductal Carcinoma in Situ (DCIS): Present, high grade. Extent of Tumor: Confined to breast parenchyma. Margins: Invasive carcinoma, distance from closest margin: 2 cm (posterior). DCIS, distance from closest margin: 2 cm (posterior). Regional Lymph Nodes: Number of Lymph Nodes Examined: 8 Number of Sentinel Lymph Nodes Examined: 0 Lymph Nodes with Macrometastases: 0 Lymph Nodes with Micrometastases: 0 Lymph Nodes with Isolated Tumor Cells: 0 Breast Prognostic Profile: Performed on biopsy [id number], see below. Will not be repeated. Estrogen Receptor: Negative. Progesterone Receptor: Negative. Her2: Negative (1.30 ratio). 1 of 3 FINAL for KEALEY, KEMMER ([id number]) Microscopic Comment(continued) Ki-67: 15% Best tumor block for sendout testing: 1D Pathologic Stage Classification (pTNM, AJCC 8th Edition): Primary Tumor (pT): pT1c Regional Lymph Nodes (pN): pN0 Distant Metastases (pM): pMX Comments: None. Vicente Males MD Pathologist, Electronic Signature   ASSESSMENT/PLAN Cancer Staging Malignant neoplasm of upper-outer quadrant of left female breast [facility]) Staging form: Breast, AJCC 8th Edition - Clinical: cT1c, cN0 - Unsigned -  Pathologic: Stage IB (pT1c, pN0, cM0, G3, ER: Negative, PR: Negative, HER2: Negative) - Signed by Twana First, MD on 10/09/2017   -Dose reduced by 25% due to severe fatigue with cycle 1 of AC.  -Chest Pain/palpatations: STAT EKG. Showed Normal Sinus Rhythm with non-specific T-wave abnormailty. STAT Labs (CBC and CMET) with Magnesium. Awaiting Echo results. Echo shows mild LVH with EF 55-60% with grade 1 diastolic dysfunction.  Trivial mitral and tricuspid regurgitation noted.  Previous echo in November noted an EF of 60-65% with no regional wall motion abnormalities.  Patient needs cardiology clearance prior to initiation of therapy. -Hypomagnesemia: Mag 1.3 today. Will give 4 G of Mag IV.  During mag infusion patient began to have recurrent chest pain.  Magnesium stopped and NP assessed.  Vital signs and oxygen saturations okay.  Patient did not want to proceed with more IV magnesium at this time.  Patient was given a prescription for oral magnesium but she refused to fill. Patient was given a list of magnesium rich foods.  Patient given strict instructions to return to clinic or ED if symptoms worsen.  -Diarrhea:  Rx Lomotil as needed for diarrhea.  -Patient had 2 pound weight gain.  She is not nauseated.  She was seen by dietitian today.  Continue boost and Ensure's daily.  - Will refer back to lymphedema clinic.  -RTC after cardiology clearance.  All questions were answered. The patient knows to call the clinic with any problems, questions or concerns.  This note was electronically signed.    Jacquelin Hawking, NP  11/23/2017 10:17 AM

## 2017-11-21 ENCOUNTER — Encounter (HOSPITAL_BASED_OUTPATIENT_CLINIC_OR_DEPARTMENT_OTHER): Payer: Medicare Other | Admitting: Oncology

## 2017-11-21 ENCOUNTER — Encounter (HOSPITAL_COMMUNITY): Payer: Medicare Other

## 2017-11-21 ENCOUNTER — Ambulatory Visit (HOSPITAL_COMMUNITY)
Admission: RE | Admit: 2017-11-21 | Discharge: 2017-11-21 | Disposition: A | Discharge status: Home / Self Care | Payer: Medicare Other | Source: Ambulatory Visit | Attending: Adult Health | Admitting: Adult Health

## 2017-11-21 ENCOUNTER — Encounter (HOSPITAL_COMMUNITY): Payer: Self-pay | Admitting: Oncology

## 2017-11-21 ENCOUNTER — Other Ambulatory Visit: Payer: Self-pay

## 2017-11-21 ENCOUNTER — Other Ambulatory Visit: Payer: Self-pay | Admitting: Oncology

## 2017-11-21 ENCOUNTER — Other Ambulatory Visit (HOSPITAL_COMMUNITY): Payer: Self-pay | Admitting: Adult Health

## 2017-11-21 ENCOUNTER — Encounter (HOSPITAL_BASED_OUTPATIENT_CLINIC_OR_DEPARTMENT_OTHER): Payer: Medicare Other

## 2017-11-21 DIAGNOSIS — I080 Rheumatic disorders of both mitral and aortic valves: Secondary | ICD-10-CM | POA: Insufficient documentation

## 2017-11-21 DIAGNOSIS — Z171 Estrogen receptor negative status [ER-]: Secondary | ICD-10-CM | POA: Diagnosis not present

## 2017-11-21 DIAGNOSIS — R197 Diarrhea, unspecified: Secondary | ICD-10-CM

## 2017-11-21 DIAGNOSIS — R079 Chest pain, unspecified: Secondary | ICD-10-CM | POA: Diagnosis not present

## 2017-11-21 DIAGNOSIS — Z9012 Acquired absence of left breast and nipple: Secondary | ICD-10-CM | POA: Diagnosis not present

## 2017-11-21 DIAGNOSIS — C50919 Malignant neoplasm of unspecified site of unspecified female breast: Secondary | ICD-10-CM

## 2017-11-21 DIAGNOSIS — I119 Hypertensive heart disease without heart failure: Secondary | ICD-10-CM | POA: Insufficient documentation

## 2017-11-21 DIAGNOSIS — C50412 Malignant neoplasm of upper-outer quadrant of left female breast: Secondary | ICD-10-CM | POA: Diagnosis not present

## 2017-11-21 DIAGNOSIS — E7800 Pure hypercholesterolemia, unspecified: Secondary | ICD-10-CM | POA: Insufficient documentation

## 2017-11-21 DIAGNOSIS — I5189 Other ill-defined heart diseases: Secondary | ICD-10-CM

## 2017-11-21 DIAGNOSIS — C50912 Malignant neoplasm of unspecified site of left female breast: Secondary | ICD-10-CM | POA: Diagnosis not present

## 2017-11-21 LAB — CBC WITH DIFFERENTIAL/PLATELET
BASOS ABS: 0.1 10*3/uL (ref 0.0–0.1)
BASOS PCT: 1 %
EOS ABS: 0 10*3/uL (ref 0.0–0.7)
EOS PCT: 0 %
HCT: 36.1 % (ref 36.0–46.0)
Hemoglobin: 12 g/dL (ref 12.0–15.0)
LYMPHS PCT: 28 %
Lymphs Abs: 3.8 10*3/uL (ref 0.7–4.0)
MCH: 29.1 pg (ref 26.0–34.0)
MCHC: 33.2 g/dL (ref 30.0–36.0)
MCV: 87.4 fL (ref 78.0–100.0)
MONO ABS: 1 10*3/uL (ref 0.1–1.0)
Monocytes Relative: 7 %
Neutro Abs: 8.4 10*3/uL (ref 1.7–7.7)
Neutrophils Relative %: 64 %
PLATELETS: 113 10*3/uL — AB (ref 150–400)
RBC: 4.13 MIL/uL (ref 3.87–5.11)
RDW: 15.1 % (ref 11.5–15.5)
WBC: 13.2 10*3/uL — AB (ref 4.0–10.5)

## 2017-11-21 LAB — ECHOCARDIOGRAM COMPLETE
AVLVOTPG: 6 mmHg
CHL CUP DOP CALC LVOT VTI: 21.6 cm
E/e' ratio: 9.19
EWDT: 264 ms
FS: 36 % (ref 28–44)
IVS/LV PW RATIO, ED: 1.05
LA diam end sys: 35 mm
LA diam index: 1.4 cm/m2
LA vol A4C: 42.3 ml
LA vol: 47.4 mL
LASIZE: 35 mm
LAVOLIN: 18.9 mL/m2
LDCA: 3.8 cm2
LV E/e' medial: 9.19
LV PW d: 13 mm — AB (ref 0.6–1.1)
LV TDI E'LATERAL: 8.92
LV TDI E'MEDIAL: 6.64
LV dias vol index: 25 mL/m2
LV dias vol: 64 mL (ref 46–106)
LV sys vol: 22 mL
LVEEAVG: 9.19
LVELAT: 8.92 cm/s
LVOTD: 22 mm
LVOTPV: 121 cm/s
LVOTSV: 82 mL
LVSYSVOLIN: 9 mL/m2
MV Dec: 264
MV pk A vel: 114 m/s
MV pk E vel: 82 m/s
MVPG: 3 mmHg
RV LATERAL S' VELOCITY: 12.1 cm/s
RV TAPSE: 15.6 mm
Simpson's disk: 66
Stroke v: 42 ml
Weight: 4428.8 oz

## 2017-11-21 LAB — COMPREHENSIVE METABOLIC PANEL
ALBUMIN: 3.7 g/dL (ref 3.5–5.0)
ALK PHOS: 99 U/L (ref 38–126)
ALT: 25 U/L (ref 14–54)
AST: 25 U/L (ref 15–41)
Anion gap: 13 (ref 5–15)
BILIRUBIN TOTAL: 0.4 mg/dL (ref 0.3–1.2)
BUN: 11 mg/dL (ref 6–20)
CALCIUM: 9.2 mg/dL (ref 8.9–10.3)
CO2: 23 mmol/L (ref 22–32)
CREATININE: 0.85 mg/dL (ref 0.44–1.00)
Chloride: 101 mmol/L (ref 101–111)
GFR calc Af Amer: >60 mL/min (ref >60)
GLUCOSE: 143 mg/dL — AB (ref 65–99)
Potassium: 3.7 mmol/L (ref 3.5–5.1)
Sodium: 137 mmol/L (ref 135–145)
TOTAL PROTEIN: 6.8 g/dL (ref 6.5–8.1)

## 2017-11-21 LAB — MAGNESIUM: MAGNESIUM: 1.3 mg/dL — AB (ref 1.7–2.4)

## 2017-11-21 MED ORDER — MAGNESIUM SULFATE 4 GM/100ML IV SOLN
4.0000 g | Freq: Once | INTRAVENOUS | Status: AC
  Administered 2017-11-21: 4 g via INTRAVENOUS
  Filled 2017-11-21: qty 100

## 2017-11-21 MED ORDER — DIPHENOXYLATE-ATROPINE 2.5-0.025 MG PO TABS: 1.0000 | ORAL_TABLET | Freq: Four times a day (QID) | ORAL | 0 refills | Status: DC | PRN

## 2017-11-21 MED ORDER — HEPARIN SOD (PORK) LOCK FLUSH 100 UNIT/ML IV SOLN
500.0000 [IU] | Freq: Once | INTRAVENOUS | Status: AC
  Administered 2017-11-21: 500 [IU] via INTRAVENOUS

## 2017-11-21 MED ORDER — PERFLUTREN LIPID MICROSPHERE
1.0000 mL | INTRAVENOUS | Status: DC | PRN
  Administered 2017-11-21: 1 mL via INTRAVENOUS
  Administered 2017-11-21: 2 mL via INTRAVENOUS
  Filled 2017-11-21: qty 10

## 2017-11-21 MED ORDER — PERFLUTREN LIPID MICROSPHERE
1.0000 mL | INTRAVENOUS | Status: AC | PRN
  Filled 2017-11-21: qty 10

## 2017-11-21 MED ORDER — SODIUM CHLORIDE 0.9 % IV SOLN
INTRAVENOUS | Status: DC
  Administered 2017-11-21: 12:00:00 via INTRAVENOUS

## 2017-11-21 MED ORDER — SODIUM CHLORIDE 0.9% FLUSH
10.0000 mL | Freq: Once | INTRAVENOUS | Status: AC
  Administered 2017-11-21: 10 mL via INTRAVENOUS

## 2017-11-21 MED ORDER — MAGNESIUM OXIDE 400 (241.3 MG) MG PO TABS: 400.0000 mg | ORAL_TABLET | Freq: Two times a day (BID) | ORAL | 0 refills | Status: DC

## 2017-11-21 NOTE — Progress Notes (Signed)
Nutrition Assessment   Reason for Assessment:   Patient identified on Malnutrition Screening report for weight loss and poor appetite  ASSESSMENT:  67 year old female with triple positive left breast cancer.  Patient s/p left radical mastectomy with lymph node dissection, DM, HTN, hypercholesteremia, peptic ulcer.  Met with patient and daughter today during infusion. Patient reports appetite is better than when she had cycle 1 of chemotherapy. Reports she ate 2 sausage biscuits today for breakfast.  Reports she just eats when she gets hungry.  May only eat one thing during the day like macaroni and cheese.  Eats pinto bean sandwich.  Not able to get more about what she eats during the day.  Daughter reports she is eating trust me.  Reports that she has been drinking boost 2 times per day and like it.  No nausea except for 1 time after drinking orange juice.  Reports fatigue has been her biggest problem.  Reports some issues with sore mouth but better.  Did reports few episodes of diarrhea during first round of chemotherapy.    Nutrition Focused Physical Exam: deferred  Medications: glucose 180  Labs: Vit D3, decadron, metformin, prilosec, compazine, phenergan  Anthropometrics:   Height: 67.5 inches Weight: 276 lb UBW: 276 lb BMI: 42  Has recently gained weight back    Estimated Energy Needs  Kcals: 2700 calories Protein: 92-122 g/d Fluid: 2.7 L/d  NUTRITION DIAGNOSIS: Inadequate oral intake related to cancer and cancer related treatments as evidenced by weight loss and decreased intake due to fatigue.  However this has improved   MALNUTRITION DIAGNOSIS: none at this time   INTERVENTION:   Discussed foods high in protein and encouraged pt to include those at every meal.   Discussed oral nutrition supplements.  Patient thinking insurance company would pay for oral nutrition supplements and she would pick it up here. Discussed ensure assistance program and first case of  ensure plus given to patient today.  Also informed her that if her insurance company would pay for ensure/boost that NP would be able to write a script and patient could take it to her pharmacy to be filled.  Patient declined at this time.      MONITORING, EVALUATION, GOAL: Patient will consume adequate calories to maintain weight during treatment   NEXT VISIT: as needed   Vietta Bonifield B. Zenia Resides, [place], [place] Registered Dietitian [phone] (pager)

## 2017-11-21 NOTE — Progress Notes (Signed)
Patient for chemotherapy and oncology follow up.  Stated fatigue and decreased appetite.  Uses imodium for diarrhea with relief from medication.  Denied neuropathy in hands or feet.  Stated SOB but due to COPD.  Denied worsening of SOB.  Stated when she coughs phlegm it is white.  Denied fevers and chills.   Denied dysuria.   Last Echo 10/10/17.    Labs drawn from port with good blood return.  EKG today before treatment.    Echo to be scheduled for today and hold treatment.  Pharmacy made aware.    Labs reviewed with Faythe Casa, NP, and the patient will get Magnesium 4 gms IV today verbal order.  Orders entered.     Patient seen by Faythe Casa, NP, and ok to discharge home.  Patients port site clean and dry with no bruising or swelling noted at site.  Band aid applied.  VSS with discharge and no complaints voiced.  No s/s of distress noted and left with daughter.

## 2017-11-21 NOTE — Progress Notes (Signed)
*  PRELIMINARY RESULTS* Echocardiogram 2D Echocardiogram has been performed with Definity.  Samuel Germany 11/21/2017, 2:46 PM

## 2017-11-21 NOTE — Patient Instructions (Signed)
[place] [facility]  Discharge Instructions:  You were not treated today.  You received magnesium.  Tests performed EKG and Echo.  _______________________________________________________________  Thank you for choosing [place] [facility] to provide your oncology and hematology care.  To afford each patient quality time with our providers, please arrive at least 15 minutes before your scheduled appointment.  You need to re-schedule your appointment if you arrive 10 or more minutes late.  We strive to give you quality time with our providers, and arriving late affects you and other patients whose appointments are after yours.  Also, if you no show three or more times for appointments you may be dismissed from the clinic.  Again, thank you for choosing [place] [facility] [place] hope is that these requests will allow you access to exceptional care and in a timely manner. _______________________________________________________________  If you have questions after your visit, please contact our office at [phone] between the hours of 8:30 a.m. and 5:00 p.m. Voicemails left after 4:30 p.m. will not be returned until the following business day. _______________________________________________________________  For prescription refill requests, have your pharmacy contact our office. _______________________________________________________________  Recommendations made by the consultant and any test results will be sent to your referring physician. _______________________________________________________________

## 2017-11-21 NOTE — Progress Notes (Signed)
1240 Pt c/o heaviness on the left side of her chest while laying back and it feels like her heart is skipping a beat. Magnesium infusion stopped and NS started. Pt sitting up now. Mike Craze NP notified and in to see pt. B/P 142/85, P 110, O2 sat 96%. Skin warm and dry. Pt denies any SOB.  9  Pt reports feeling better now,heart just feels like it's beating fast.Pt reports that she doesn't want any more Magnesium, she feels like that made things worse.   1250 Personnel here at bedside to perform 2-D Echo

## 2017-11-28 ENCOUNTER — Encounter (HOSPITAL_COMMUNITY): Payer: Medicare Other

## 2017-11-28 ENCOUNTER — Ambulatory Visit (HOSPITAL_COMMUNITY): Payer: Medicare Other | Admitting: Oncology

## 2017-11-28 ENCOUNTER — Ambulatory Visit (HOSPITAL_COMMUNITY): Payer: Medicare Other

## 2017-12-03 ENCOUNTER — Ambulatory Visit (HOSPITAL_COMMUNITY): Payer: Medicare Other | Admitting: Physical Therapy

## 2017-12-03 ENCOUNTER — Telehealth (HOSPITAL_COMMUNITY): Payer: Self-pay | Admitting: Family Medicine

## 2017-12-03 ENCOUNTER — Telehealth (HOSPITAL_COMMUNITY): Payer: Self-pay | Admitting: Physical Therapy

## 2017-12-03 NOTE — Telephone Encounter (Signed)
12/03/17  pt came in but said that she wanted to go to the place in [place] since she lives there.  She said that she spoke with our office on 1/8 and was upset by the person she talked to and just wants to go to [employer]

## 2017-12-03 NOTE — Telephone Encounter (Signed)
Called pt with benefits - pt states she can not afford to come 3xwk. Pt agreed to keep Eval apptment and then make a decision about her finances.

## 2017-12-04 ENCOUNTER — Encounter: Payer: Self-pay | Admitting: Cardiovascular Disease

## 2017-12-04 ENCOUNTER — Ambulatory Visit (INDEPENDENT_AMBULATORY_CARE_PROVIDER_SITE_OTHER): Payer: Medicare Other | Admitting: Cardiovascular Disease

## 2017-12-04 ENCOUNTER — Encounter: Payer: Self-pay | Admitting: *Deleted

## 2017-12-04 VITALS — BP 134/78 | HR 97 | Ht 67.5 in | Wt 277.0 lb

## 2017-12-04 DIAGNOSIS — E119 Type 2 diabetes mellitus without complications: Secondary | ICD-10-CM | POA: Diagnosis not present

## 2017-12-04 DIAGNOSIS — R079 Chest pain, unspecified: Secondary | ICD-10-CM | POA: Diagnosis not present

## 2017-12-04 DIAGNOSIS — I429 Cardiomyopathy, unspecified: Secondary | ICD-10-CM | POA: Diagnosis not present

## 2017-12-04 DIAGNOSIS — E785 Hyperlipidemia, unspecified: Secondary | ICD-10-CM | POA: Diagnosis not present

## 2017-12-04 NOTE — Addendum Note (Signed)
Addended by: Laurine Blazer on: 12/04/2017 01:55 PM   Modules accepted: Orders

## 2017-12-04 NOTE — Patient Instructions (Addendum)
Medication Instructions:  Continue all current medications.  Labwork: none  Testing/Procedures:  Your physician has requested that you have a lexiscan myoview. For further information please visit [email or web address]. Please follow instruction sheet, as given.    PLEASE CALL THE OFFICE WHEN YOU ARE READY TO SCHEDULE   Office will contact with results via phone or letter.    Follow-Up: To be determined   Any Other Special Instructions Will Be Listed Below (If Applicable).  If you need a refill on your cardiac medications before your next appointment, please call your pharmacy.

## 2017-12-04 NOTE — Progress Notes (Signed)
CARDIOLOGY CONSULT NOTE  Patient ID: [name] MRN: [id number] DOB/AGE: 01-15-1951 66 y.o.  Admit date: (Not on file) Primary Physician: Lucia Gaskins, MD Referring Physician: Dr. Talbert Cage  Reason for Consultation: Clearance prior to undergoing further chemotherapy  HPI: Kelli Wilson is a 67 y.o. female who is being seen today for the evaluation of cardiac status prior to undergoing further cycles of chemotherapy at the request of Dr. Talbert Cage.  Past medical history includes triple negative high-grade invasive ductal carcinoma of the left breast. She was given 1 cycle of Cytoxan and Adriamycin on 10/20/17.  Echocardiogram on 10/10/17 demonstrated normal left ventricular systolic function and regional wall motion, LVEF 60-65%, with grade 1 diastolic dysfunction.  A follow-up echocardiogram performed on 11/21/17 demonstrated normal left ventricular systolic function and regional wall motion, LVEF 55-60%, mild LVH, and grade 1 diastolic dysfunction.  She developed some left-sided chest heaviness on 11/21/17 for which the echocardiogram was performed to rule out Adriamycin-induced toxicity.  ECG which I interpreted on 11/21/17 demonstrated sinus rhythm with nonspecific T wave abnormalities.  Magnesium was noted to be low at 1.3.  White blood cells were mildly elevated at 13.2.  Hemoglobin was normal at 12.  She was mildly thrombocytopenic with a platelet count of 113.  Renal function was normal.  She began experience some chest pain during the time of her last cycle of chemotherapy.  She said pains are subsiding.  She underwent a normal coronary angiogram on 04/26/2004.  She subsequently underwent a normal nuclear stress test on 03/03/2010.  She continues to experience retrosternal chest pains which are constant but are subsiding in intensity.  She does take sublingual nitroglycerin on occasion and did so recently.  Past medical history also includes type 2 diabetes for  which she takes metformin and hyperlipidemia.  She also has a history of diffuse arthritis and fibromyalgia.   Allergies  Allergen Reactions  . Magnesium Sulfate Shortness Of Breath    Experienced shortness of breath and chest pain during mag sulfate infusion. -11/21/17 -gwd  . Other Anaphylaxis and Other (See Comments)    Lysol and other cleaning products  . Iohexol Other (See Comments)     Code: HIVES, Desc: PT STATES SHE WAS GIVEN IV DYE AT [facility] AND BROKE OUT IN HIVES, RASH, AND HAD TO BE HOSPITALIZED DUE TO REACTION   . Ivp Dye [Iodinated Diagnostic Agents] Other (See Comments)    Unknown  . Januvia [Sitagliptin]     Stomach pains   . Latex Itching  . Motrin [Ibuprofen] Other (See Comments)    Unknown  . Nsaids Other (See Comments)    Unknown  . Statins Other (See Comments)    Unknown  . Sulfa Antibiotics Other (See Comments)    Unknown  . Adhesive [Tape] Rash    Current Outpatient Medications  Medication Sig Dispense Refill  . albuterol (PROVENTIL) (2.5 MG/3ML) 0.083% nebulizer solution Take 2.5 mg by nebulization every 6 (six) hours as needed for wheezing or shortness of breath.     . ALPRAZolam (XANAX) 1 MG tablet Take 1 mg 3 (three) times daily as needed by mouth for anxiety.     Marland Kitchen azithromycin (ZITHROMAX) 250 MG tablet TAKE TWO (2) TABLETS ON FIRST DAY, THEN TAKE ONE (1) TABLET EACH DAY FOR THE NEXT 4 DAYS.  0  . Camphor-Eucalyptus-Menthol (VICKS VAPORUB EX) Apply 1 application daily as needed topically (congestion).    . Cholecalciferol (VITAMIN D3) 5000 units CAPS Take 5,000  Units by mouth daily.    . cyclophosphamide (CYTOXAN) 2 g chemo injection Inject once into the vein. Every 2 weeks for 4 cycles    . dexamethasone (DECADRON) 4 MG tablet Take 2 tablets by mouth once a day on the day after chemotherapy and then take 2 tablets two times a day for 2 days. Take with food. 30 tablet 1  . diphenoxylate-atropine (LOMOTIL) 2.5-0.025 MG tablet Take 1 tablet by mouth 4  (four) times daily as needed for diarrhea or loose stools. 30 tablet 0  . DOXOrubicin HCl (ADRIAMYCIN IV) Inject into the vein. Every 2 weeks for 4 cycles    . HYDROcodone-acetaminophen (NORCO) 7.5-325 MG tablet Take 1 tablet by mouth 4 (four) times daily.  0  . hydrOXYzine (ATARAX/VISTARIL) 50 MG tablet Take 50 mg by mouth at bedtime.     . Lidocaine 0.5 % GEL Apply 1 application topically as needed (for pain).    Marland Kitchen lidocaine-prilocaine (EMLA) cream Apply to affected area once 30 g 3  . metFORMIN (GLUCOPHAGE) 500 MG tablet Take 500 mg by mouth 2 (two) times daily with a meal.     . nitroGLYCERIN (NITROSTAT) 0.4 MG SL tablet Place 0.4 mg under the tongue every 5 (five) minutes as needed for chest pain.    Marland Kitchen omeprazole (PRILOSEC) 40 MG capsule Take 40 mg every other day by mouth. At night    . ondansetron (ZOFRAN) 8 MG tablet Take 1 tablet (8 mg total) 2 (two) times daily as needed by mouth. Start on the third day after chemotherapy. 30 tablet 1  . Pegfilgrastim (NEULASTA ONPRO [place]) Inject into the skin. For 4 cycles    . pravastatin (PRAVACHOL) 40 MG tablet Take 40 mg by mouth daily.    . prochlorperazine (COMPAZINE) 10 MG tablet Take 1 tablet (10 mg total) every 6 (six) hours as needed by mouth (Nausea or vomiting). 30 tablet 1  . promethazine (PHENERGAN) 25 MG tablet Take 25 mg by mouth every 6 (six) hours as needed for nausea or vomiting.    . Pseudoeph-Doxylamine-DM-APAP (NYQUIL PO) Take 2 tablets at bedtime as needed by mouth (sinus headaches).     No current facility-administered medications for this visit.     Past Medical History:  Diagnosis Date  . Anxiety   . Arthritis   . Back pain   . Byssinosis ([place])    secondary to working in a [employer]  . Chest pain   . Chronic pain   . Depression   . Diabetes mellitus without complication ([place])   . Fibromyalgia   . GERD (gastroesophageal reflux disease)   . Hypercholesteremia   . Hypertension   . Knee pain   . Obesity   . Peptic  ulcer    history of - normal GI studies Sept 2010    Past Surgical History:  Procedure Laterality Date  . APPENDECTOMY    . bladder rectal repair    . BREAST SURGERY Left    breast biopsy- benign  . CHOLECYSTECTOMY    . DILATION AND CURETTAGE OF UTERUS    . GANGLION CYST EXCISION Left   . MASTECTOMY MODIFIED RADICAL Left 09/15/2017   Procedure: LEFT MODIFIED RADICAL MASTECTOMY;  Surgeon: Aviva Signs, MD;  Location: [facility];  Service: General;  Laterality: Left;  . PORTACATH PLACEMENT Right 10/13/2017   Procedure: INSERTION PORT-A-CATH RIGHT SUBCLAVIAN;  Surgeon: Aviva Signs, MD;  Location: [facility];  Service: General;  Laterality: Right;  . TOTAL ABDOMINAL HYSTERECTOMY  Social History   Socioeconomic History  . Marital status: Married    Spouse name: Not on file  . Number of children: Not on file  . Years of education: Not on file  . Highest education level: Not on file  Social Needs  . Financial resource strain: Not on file  . Food insecurity - worry: Not on file  . Food insecurity - inability: Not on file  . Transportation needs - medical: Not on file  . Transportation needs - non-medical: Not on file  Occupational History  . 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 Smoking status: Never Smoker  . Smokeless tobacco: Current User    Types: Snuff  Substance and Sexual Activity  . Alcohol use: No  . Drug use: No  . Sexual activity: No    Birth control/protection: Surgical  Other Topics Concern  . Not on file  Social History Narrative  . Not on file     No family history of premature CAD in 1st degree relatives.  Current Meds  Medication Sig  . albuterol (PROVENTIL) (2.5 MG/3ML) 0.083% nebulizer solution Take 2.5 mg by nebulization every 6 (six) hours as needed for wheezing or shortness of breath.   . ALPRAZolam (XANAX) 1 MG tablet Take 1 mg 3 (three) times daily as needed by mouth for anxiety.   Marland Kitchen  azithromycin (ZITHROMAX) 250 MG tablet TAKE TWO (2) TABLETS ON FIRST DAY, THEN TAKE ONE (1) TABLET EACH DAY FOR THE NEXT 4 DAYS.  . Camphor-Eucalyptus-Menthol (VICKS VAPORUB EX) Apply 1 application daily as needed topically (congestion).  . Cholecalciferol (VITAMIN D3) 5000 units CAPS Take 5,000 Units by mouth daily.  . cyclophosphamide (CYTOXAN) 2 g chemo injection Inject once into the vein. Every 2 weeks for 4 cycles  . dexamethasone (DECADRON) 4 MG tablet Take 2 tablets by mouth once a day on the day after chemotherapy and then take 2 tablets two times a day for 2 days. Take with food.  . diphenoxylate-atropine (LOMOTIL) 2.5-0.025 MG tablet Take 1 tablet by mouth 4 (four) times daily as needed for diarrhea or loose stools.  Marland Kitchen DOXOrubicin HCl (ADRIAMYCIN IV) Inject into the vein. Every 2 weeks for 4 cycles  . HYDROcodone-acetaminophen (NORCO) 7.5-325 MG tablet Take 1 tablet by mouth 4 (four) times daily.  . hydrOXYzine (ATARAX/VISTARIL) 50 MG tablet Take 50 mg by mouth at bedtime.   . Lidocaine 0.5 % GEL Apply 1 application topically as needed (for pain).  Marland Kitchen lidocaine-prilocaine (EMLA) cream Apply to affected area once  . metFORMIN (GLUCOPHAGE) 500 MG tablet Take 500 mg by mouth 2 (two) times daily with a meal.   . nitroGLYCERIN (NITROSTAT) 0.4 MG SL tablet Place 0.4 mg under the tongue every 5 (five) minutes as needed for chest pain.  Marland Kitchen omeprazole (PRILOSEC) 40 MG capsule Take 40 mg every other day by mouth. At night  . ondansetron (ZOFRAN) 8 MG tablet Take 1 tablet (8 mg total) 2 (two) times daily as needed by mouth. Start on the third day after chemotherapy.  . Pegfilgrastim (NEULASTA ONPRO [place]) Inject into the skin. For 4 cycles  . pravastatin (PRAVACHOL) 40 MG tablet Take 40 mg by mouth daily.  . prochlorperazine (COMPAZINE) 10 MG tablet Take 1 tablet (10 mg total) every 6 (six) hours as needed by mouth (Nausea or vomiting).  . promethazine (PHENERGAN) 25 MG tablet Take 25 mg by mouth every 6  (six) hours as needed for  nausea or vomiting.  . Pseudoeph-Doxylamine-DM-APAP (NYQUIL PO) Take 2 tablets at bedtime as needed by mouth (sinus headaches).      Review of systems complete and found to be negative unless listed above in HPI    Physical exam Blood pressure 134/78, pulse 97, height 5' 7.5" (1.715 m), weight 277 lb (125.6 kg), SpO2 98 %. General: NAD Neck: No JVD, no thyromegaly or thyroid nodule.  Lungs: Clear to auscultation bilaterally with normal respiratory effort. CV: Nondisplaced PMI. Regular rate and rhythm, normal S1/S2, no S3/S4, no murmur.  No peripheral edema.  No carotid bruit.    Abdomen: Soft, nontender, no distention.  Skin: Intact without lesions or rashes.  Neurologic: Alert and oriented x 3.  Psych: Normal affect. Extremities: No clubbing or cyanosis.  HEENT: Normal.   ECG: Most recent ECG reviewed.   Labs: Lab Results  Component Value Date/Time   K 3.7 11/21/2017 09:24 AM   BUN 11 11/21/2017 09:24 AM   CREATININE 0.85 11/21/2017 09:24 AM   ALT 25 11/21/2017 09:24 AM   HGB 12.0 11/21/2017 09:24 AM     Lipids: No results found for: LDLCALC, LDLDIRECT, CHOL, TRIG, HDL      ASSESSMENT AND PLAN:  1.  Chest pain: Risk factors for ischemic heart disease include type 2 diabetes mellitus and hyperlipidemia.  She did undergo a normal coronary angiogram as described above on 04/26/2004.  She then underwent a normal nuclear stress test on 03/03/2010.  I will obtain a Lexiscan Myoview nuclear stress test to rule out any forms of ischemic heart disease.  2.  Cardiomyopathy: She has received 1 cycle of Adriamycin.  While LVEF is now 55-60% with a previous reported EF of 66-29%, there is certainly overlap and left ventricular systolic function remains normal with stable grade 1/mild diastolic dysfunction.  I think she is safe to proceed with chemotherapy.  I would repeat an echocardiogram within 3 months.  3.  Type 2 diabetes mellitus: Currently on  metformin.  4.  Hyperlipidemia: Continue pravastatin.    Disposition: Follow up in 3 months  Signed: Kate Sable, M.D., F.A.C.C.  12/04/2017, 1:25 PM

## 2017-12-08 ENCOUNTER — Ambulatory Visit (HOSPITAL_COMMUNITY): Payer: Medicare Other

## 2017-12-09 ENCOUNTER — Other Ambulatory Visit: Payer: Self-pay

## 2017-12-09 ENCOUNTER — Inpatient Hospital Stay (HOSPITAL_COMMUNITY): Payer: Medicare Other | Admitting: Dietician

## 2017-12-09 ENCOUNTER — Encounter (HOSPITAL_COMMUNITY): Payer: Self-pay

## 2017-12-09 ENCOUNTER — Inpatient Hospital Stay (HOSPITAL_COMMUNITY): Payer: Medicare Other | Attending: Hematology and Oncology

## 2017-12-09 VITALS — BP 147/83 | HR 80 | Temp 97.6°F | Resp 20 | Wt 277.6 lb

## 2017-12-09 DIAGNOSIS — C50912 Malignant neoplasm of unspecified site of left female breast: Secondary | ICD-10-CM | POA: Insufficient documentation

## 2017-12-09 DIAGNOSIS — C50919 Malignant neoplasm of unspecified site of unspecified female breast: Secondary | ICD-10-CM

## 2017-12-09 DIAGNOSIS — Z5111 Encounter for antineoplastic chemotherapy: Secondary | ICD-10-CM | POA: Diagnosis present

## 2017-12-09 DIAGNOSIS — I10 Essential (primary) hypertension: Secondary | ICD-10-CM | POA: Diagnosis not present

## 2017-12-09 DIAGNOSIS — I890 Lymphedema, not elsewhere classified: Secondary | ICD-10-CM | POA: Insufficient documentation

## 2017-12-09 DIAGNOSIS — Z171 Estrogen receptor negative status [ER-]: Secondary | ICD-10-CM | POA: Insufficient documentation

## 2017-12-09 DIAGNOSIS — R197 Diarrhea, unspecified: Secondary | ICD-10-CM | POA: Insufficient documentation

## 2017-12-09 DIAGNOSIS — C50412 Malignant neoplasm of upper-outer quadrant of left female breast: Secondary | ICD-10-CM | POA: Insufficient documentation

## 2017-12-09 DIAGNOSIS — E119 Type 2 diabetes mellitus without complications: Secondary | ICD-10-CM | POA: Insufficient documentation

## 2017-12-09 DIAGNOSIS — Z5189 Encounter for other specified aftercare: Secondary | ICD-10-CM | POA: Insufficient documentation

## 2017-12-09 LAB — CBC WITH DIFFERENTIAL/PLATELET
BASOS ABS: 0.1 10*3/uL (ref 0.0–0.1)
Basophils Relative: 1 %
EOS PCT: 4 %
Eosinophils Absolute: 0.3 10*3/uL (ref 0.0–0.7)
HEMATOCRIT: 36.1 % (ref 36.0–46.0)
HEMOGLOBIN: 11.8 g/dL — AB (ref 12.0–15.0)
LYMPHS PCT: 28 %
Lymphs Abs: 2.4 10*3/uL (ref 0.7–4.0)
MCH: 29.1 pg (ref 26.0–34.0)
MCHC: 32.7 g/dL (ref 30.0–36.0)
MCV: 89.1 fL (ref 78.0–100.0)
Monocytes Absolute: 0.9 10*3/uL (ref 0.1–1.0)
Monocytes Relative: 10 %
NEUTROS ABS: 4.8 10*3/uL (ref 1.7–7.7)
NEUTROS PCT: 57 %
PLATELETS: 218 10*3/uL (ref 150–400)
RBC: 4.05 MIL/uL (ref 3.87–5.11)
RDW: 15.6 % — ABNORMAL HIGH (ref 11.5–15.5)
WBC: 8.4 10*3/uL (ref 4.0–10.5)

## 2017-12-09 LAB — COMPREHENSIVE METABOLIC PANEL
ALT: 19 U/L (ref 14–54)
ANION GAP: 11 (ref 5–15)
AST: 22 U/L (ref 15–41)
Albumin: 3.6 g/dL (ref 3.5–5.0)
Alkaline Phosphatase: 77 U/L (ref 38–126)
BILIRUBIN TOTAL: 0.5 mg/dL (ref 0.3–1.2)
BUN: 10 mg/dL (ref 6–20)
CO2: 23 mmol/L (ref 22–32)
Calcium: 8.8 mg/dL — ABNORMAL LOW (ref 8.9–10.3)
Chloride: 103 mmol/L (ref 101–111)
Creatinine, Ser: 0.95 mg/dL (ref 0.44–1.00)
GFR calc Af Amer: >60 mL/min (ref >60)
Glucose, Bld: 156 mg/dL — ABNORMAL HIGH (ref 65–99)
POTASSIUM: 3.6 mmol/L (ref 3.5–5.1)
Sodium: 137 mmol/L (ref 135–145)
TOTAL PROTEIN: 6.8 g/dL (ref 6.5–8.1)

## 2017-12-09 MED ORDER — DOXORUBICIN HCL CHEMO IV INJECTION 2 MG/ML
45.0000 mg/m2 | Freq: Once | INTRAVENOUS | Status: AC
  Administered 2017-12-09: 110 mg via INTRAVENOUS
  Filled 2017-12-09: qty 55

## 2017-12-09 MED ORDER — PALONOSETRON HCL INJECTION 0.25 MG/5ML
0.2500 mg | Freq: Once | INTRAVENOUS | Status: AC
  Administered 2017-12-09: 0.25 mg via INTRAVENOUS
  Filled 2017-12-09: qty 5

## 2017-12-09 MED ORDER — SODIUM CHLORIDE 0.9 % IV SOLN
Freq: Once | INTRAVENOUS | Status: AC
  Administered 2017-12-09: 12:00:00 via INTRAVENOUS
  Filled 2017-12-09: qty 5

## 2017-12-09 MED ORDER — PEGFILGRASTIM 6 MG/0.6ML ~~LOC~~ PSKT
6.0000 mg | PREFILLED_SYRINGE | Freq: Once | SUBCUTANEOUS | Status: AC
  Administered 2017-12-09: 6 mg via SUBCUTANEOUS
  Filled 2017-12-09: qty 0.6

## 2017-12-09 MED ORDER — SODIUM CHLORIDE 0.9 % IV SOLN
Freq: Once | INTRAVENOUS | Status: AC
  Administered 2017-12-09: 12:00:00 via INTRAVENOUS

## 2017-12-09 MED ORDER — HEPARIN SOD (PORK) LOCK FLUSH 100 UNIT/ML IV SOLN
250.0000 [IU] | Freq: Once | INTRAVENOUS | Status: AC | PRN
  Administered 2017-12-09: 250 [IU]
  Filled 2017-12-09: qty 5

## 2017-12-09 MED ORDER — SODIUM CHLORIDE 0.9 % IV SOLN
450.0000 mg/m2 | Freq: Once | INTRAVENOUS | Status: AC
  Administered 2017-12-09: 1100 mg via INTRAVENOUS
  Filled 2017-12-09: qty 50

## 2017-12-09 NOTE — Progress Notes (Signed)
Tolerated tx w/o adverse reaction.  Alert, in no distress.  VSS.  Discharged ambulatory in c/o family.  

## 2017-12-09 NOTE — Progress Notes (Signed)
Nutrition Brief Follow-up:   ASSESSMENT: 67 year old female with triple positive left breast cancer.  Patient s/p left radical mastectomy with lymph node dissection, DM, HTN, hypercholesteremia, peptic ulcer.  Patient seen due to her past appointment needing to be rescheduled.   Patient reports that she is still having fatigue in "spells". She says she can sleep a day and a half and still be tired. She has a loss of appetite and food doesn't taste good at all. She is drinking Ensure. These cause diarrhea, but she drinks them anyway. She has chronic diarrhea. She has imodium, but she doesn't take it because she prefers diarrhea over constipation.   Patient is a poor historian and has quite poor health literacy. RD's recommendations to address her aforementioned issues were met with off topic statements. She also did not seem to understand why she needs to eat if her weight is stable and if she doesn't have an appetite. She did not know she needed protein or that chemotherapy hurts her good cells.   Noted she is already eating her foods cold to help with taste changes.   Regarding the Ensure/Diarrhea, She is "chugging" the Ensure because she doesn't like the taste. Given her hx of cholecystectomy, this is highly likely to cause diarrhea. She didn't want to drink it slower. RD recommended trying other supplements, but she says it doesn't matter what supplement it is, they all give her diarrhea.  Pt did not give impression she would change her current patterns at all.   MONITORING, EVALUATION, GOAL: Weight, intake, diet tolerance  NEXT VISIT: As needed  Burtis Junes RD, LDN, CNSC Clinical Nutrition Pager: [phone] 12/09/2017 11:48 AM

## 2017-12-10 MED ORDER — OCTREOTIDE ACETATE 30 MG IM KIT
PACK | INTRAMUSCULAR | Status: AC
  Filled 2017-12-10: qty 1

## 2017-12-12 ENCOUNTER — Encounter (HOSPITAL_COMMUNITY): Payer: Medicare Other

## 2017-12-23 ENCOUNTER — Other Ambulatory Visit: Payer: Self-pay

## 2017-12-23 ENCOUNTER — Encounter (HOSPITAL_COMMUNITY): Payer: Self-pay

## 2017-12-23 ENCOUNTER — Inpatient Hospital Stay (HOSPITAL_BASED_OUTPATIENT_CLINIC_OR_DEPARTMENT_OTHER): Payer: Medicare Other | Admitting: Internal Medicine

## 2017-12-23 ENCOUNTER — Inpatient Hospital Stay (HOSPITAL_COMMUNITY): Payer: Medicare Other

## 2017-12-23 VITALS — BP 157/95 | HR 60 | Temp 98.4°F | Resp 22 | Wt 273.0 lb

## 2017-12-23 VITALS — BP 153/73 | HR 88 | Temp 98.0°F | Resp 20

## 2017-12-23 DIAGNOSIS — K1379 Other lesions of oral mucosa: Secondary | ICD-10-CM

## 2017-12-23 DIAGNOSIS — C50412 Malignant neoplasm of upper-outer quadrant of left female breast: Secondary | ICD-10-CM

## 2017-12-23 DIAGNOSIS — Z171 Estrogen receptor negative status [ER-]: Principal | ICD-10-CM

## 2017-12-23 DIAGNOSIS — C50919 Malignant neoplasm of unspecified site of unspecified female breast: Secondary | ICD-10-CM

## 2017-12-23 DIAGNOSIS — I890 Lymphedema, not elsewhere classified: Secondary | ICD-10-CM | POA: Diagnosis not present

## 2017-12-23 DIAGNOSIS — Z5111 Encounter for antineoplastic chemotherapy: Secondary | ICD-10-CM | POA: Diagnosis not present

## 2017-12-23 DIAGNOSIS — R197 Diarrhea, unspecified: Secondary | ICD-10-CM

## 2017-12-23 LAB — COMPREHENSIVE METABOLIC PANEL
ALT: 35 U/L (ref 14–54)
AST: 34 U/L (ref 15–41)
Albumin: 3.8 g/dL (ref 3.5–5.0)
Alkaline Phosphatase: 90 U/L (ref 38–126)
Anion gap: 12 (ref 5–15)
BILIRUBIN TOTAL: 0.4 mg/dL (ref 0.3–1.2)
BUN: 15 mg/dL (ref 6–20)
CHLORIDE: 102 mmol/L (ref 101–111)
CO2: 24 mmol/L (ref 22–32)
Calcium: 9.2 mg/dL (ref 8.9–10.3)
Creatinine, Ser: 0.89 mg/dL (ref 0.44–1.00)
GFR calc Af Amer: >60 mL/min (ref >60)
GFR calc non Af Amer: >60 mL/min (ref >60)
GLUCOSE: 142 mg/dL — AB (ref 65–99)
POTASSIUM: 3.9 mmol/L (ref 3.5–5.1)
SODIUM: 138 mmol/L (ref 135–145)
Total Protein: 6.9 g/dL (ref 6.5–8.1)

## 2017-12-23 LAB — CBC WITH DIFFERENTIAL/PLATELET
Basophils Absolute: 0.1 10*3/uL (ref 0.0–0.1)
Basophils Relative: 1 %
EOS PCT: 2 %
Eosinophils Absolute: 0.2 10*3/uL (ref 0.0–0.7)
HEMATOCRIT: 38.7 % (ref 36.0–46.0)
Hemoglobin: 12.4 g/dL (ref 12.0–15.0)
LYMPHS ABS: 2.9 10*3/uL (ref 0.7–4.0)
LYMPHS PCT: 28 %
MCH: 28.8 pg (ref 26.0–34.0)
MCHC: 32 g/dL (ref 30.0–36.0)
MCV: 89.8 fL (ref 78.0–100.0)
MONO ABS: 0.8 10*3/uL (ref 0.1–1.0)
Monocytes Relative: 8 %
NEUTROS ABS: 6.2 10*3/uL (ref 1.7–7.7)
Neutrophils Relative %: 61 %
PLATELETS: 173 10*3/uL (ref 150–400)
RBC: 4.31 MIL/uL (ref 3.87–5.11)
RDW: 15.7 % — AB (ref 11.5–15.5)
WBC: 10.2 10*3/uL (ref 4.0–10.5)

## 2017-12-23 LAB — LACTATE DEHYDROGENASE: LDH: 151 U/L (ref 98–192)

## 2017-12-23 MED ORDER — PEGFILGRASTIM 6 MG/0.6ML ~~LOC~~ PSKT
6.0000 mg | PREFILLED_SYRINGE | Freq: Once | SUBCUTANEOUS | Status: AC
  Administered 2017-12-23: 6 mg via SUBCUTANEOUS
  Filled 2017-12-23: qty 0.6

## 2017-12-23 MED ORDER — LOPERAMIDE HCL 2 MG PO CAPS
2.0000 mg | ORAL_CAPSULE | Freq: Once | ORAL | Status: AC
  Administered 2017-12-23: 2 mg via ORAL

## 2017-12-23 MED ORDER — HEPARIN SOD (PORK) LOCK FLUSH 100 UNIT/ML IV SOLN
500.0000 [IU] | Freq: Once | INTRAVENOUS | Status: AC | PRN
  Administered 2017-12-23: 500 [IU]
  Filled 2017-12-23 (×2): qty 5

## 2017-12-23 MED ORDER — FIRST-DUKES MOUTHWASH MT SUSP: OROMUCOSAL | 1 refills | Status: DC

## 2017-12-23 MED ORDER — PALONOSETRON HCL INJECTION 0.25 MG/5ML
0.2500 mg | Freq: Once | INTRAVENOUS | Status: AC
  Administered 2017-12-23: 0.25 mg via INTRAVENOUS
  Filled 2017-12-23: qty 5

## 2017-12-23 MED ORDER — DOXORUBICIN HCL CHEMO IV INJECTION 2 MG/ML
45.0000 mg/m2 | Freq: Once | INTRAVENOUS | Status: AC
  Administered 2017-12-23: 110 mg via INTRAVENOUS
  Filled 2017-12-23: qty 55

## 2017-12-23 MED ORDER — FIRST-DUKES MOUTHWASH MT SUSP: OROMUCOSAL | 3 refills | Status: DC

## 2017-12-23 MED ORDER — SODIUM CHLORIDE 0.9 % IV SOLN
Freq: Once | INTRAVENOUS | Status: AC
  Administered 2017-12-23: 12:00:00 via INTRAVENOUS

## 2017-12-23 MED ORDER — SODIUM CHLORIDE 0.9 % IV SOLN
Freq: Once | INTRAVENOUS | Status: AC
  Administered 2017-12-23: 12:00:00 via INTRAVENOUS
  Filled 2017-12-23: qty 5

## 2017-12-23 MED ORDER — SODIUM CHLORIDE 0.9 % IV SOLN
450.0000 mg/m2 | Freq: Once | INTRAVENOUS | Status: AC
  Administered 2017-12-23: 1100 mg via INTRAVENOUS
  Filled 2017-12-23: qty 50

## 2017-12-23 MED ORDER — LOPERAMIDE HCL 2 MG PO CAPS
ORAL_CAPSULE | ORAL | Status: AC
  Filled 2017-12-23: qty 1

## 2017-12-23 MED ORDER — SODIUM CHLORIDE 0.9% FLUSH
10.0000 mL | INTRAVENOUS | Status: DC | PRN
  Administered 2017-12-23: 10 mL
  Filled 2017-12-23: qty 10

## 2017-12-23 NOTE — Progress Notes (Signed)
[facility] Follow up:    Kelli Wilson, [place] [address]   DIAGNOSIS: Cancer Staging Malignant neoplasm of upper-outer quadrant of left female breast [facility]) Staging form: Breast, AJCC 8th Edition - Clinical: cT1c, cN0 - Unsigned - Pathologic: Stage IB (pT1c, pN0, cM0, G3, ER: Negative, PR: Negative, HER2: Negative) - Signed by Twana First, MD on 10/09/2017   Assessment and plan:  1.  Stage 1 B triple negative left breast cancer.  Patient is status post left mastectomy.  She was seen initially by Dr. Talbert Cage and recommended for adjuvant therapy with Adriamycin and Cytoxan for 4 cycles followed by Taxol weekly for 12 weeks.  She is here today for cycle 4 of AC.  Labs are adequate for chemotherapy.  She will proceed with therapy and will return to clinic in 2 weeks for follow-up prior to beginning Taxol.  2.  Mouth pain and sores.  She is not using Magic mouthwash and this will be prescribed.  She is advised to notify the office if symptoms fail to improve.  3.  Lymphedema.  Will discuss with financial counselor facilitating her getting evaluated locally for lymphedema.  4.  Diarrhea.  She describes this as mild.  She is advised to notify the office if symptoms worsen.  DX:  Triple negative stage IB left breast cancer with high grade DCIS.  Interval history:  67 y.o. postmenopausal female with triple negative high grade invasive ductal carcinoma of the left breast with high grade DCIS. Patient was noted to have a mass in the upper outer quadrant on screening mammogram.  Core biopsy was performed which revealed invasive carcinoma.  She underwent left modified radical mastectomy with lymph node dissection on 09/15/2017.  Path returned as invasive ductal carcinoma, grade 3, spanning 1.8 cm, high-grade ductal carcinoma in situ, resection margins were negative, 8 out of 8 lymph nodes were negative for carcinoma.  Patient states that she was adopted  but knew her birth mother.  Her birth mother did not have breast cancer.  She states she had a half sister who had breast cancer in her 58s.  Current Status:  Patient is seen today for evaluation prior to C4 of AC under the direction of Dr. Talbert Cage.  She reports some mouth sores.  She is not using Magic mouthwash.  She also reports some problems with lymphedema and reports she is unable to pay for the lymphedema clinic visits.  He has occasional diarrhea but feels Lomotil helps her diarrhea.    SUMMARY OF ONCOLOGIC HISTORY: See above  CURRENT THERAPY:AC followed by Taxol  Patient Active Problem List   Diagnosis Date Noted  . Breast cancer, left breast ([place]) 09/15/2017  . Malignant neoplasm of upper-outer quadrant of left female breast ([place])   . Hypercholesteremia   . Hypertension   . Anxiety   . Depression   . Fibromyalgia   . GERD (gastroesophageal reflux disease)   . 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  MEDICAL HISTORY: Past Medical History:  Diagnosis Date  . Anxiety   . Arthritis   . Back pain   . 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 Chest pain   . Chronic pain   . Depression   . Diabetes mellitus without complication ([place])   . Fibromyalgia   .  GERD (gastroesophageal reflux disease)   . Hypercholesteremia   . Hypertension   . Knee pain   . Obesity   . Peptic ulcer    history of - normal GI studies Sept 2010    SURGICAL HISTORY: Past Surgical History:  Procedure Laterality Date  . APPENDECTOMY    . bladder rectal repair    . BREAST SURGERY Left    breast biopsy- benign  . CHOLECYSTECTOMY    . DILATION AND CURETTAGE OF UTERUS    . GANGLION CYST EXCISION Left   . MASTECTOMY MODIFIED RADICAL Left 09/15/2017   Procedure: LEFT MODIFIED RADICAL MASTECTOMY;   Surgeon: Aviva Signs, MD;  Location: [facility];  Service: General;  Laterality: Left;  . PORTACATH PLACEMENT Right 10/13/2017   Procedure: INSERTION PORT-A-CATH RIGHT SUBCLAVIAN;  Surgeon: Aviva Signs, MD;  Location: [facility];  Service: General;  Laterality: Right;  . TOTAL ABDOMINAL HYSTERECTOMY      SOCIAL HISTORY: Social History   Socioeconomic History  . Marital status: Married    Spouse name: Not on file  . Number of children: Not on file  . Years of education: Not on file  . Highest education level: Not on file  Social Needs  . Financial resource strain: Not on file  . Food insecurity - worry: Not on file  . Food insecurity - inability: Not on file  . Transportation needs - medical: Not on file  . Transportation needs - non-medical: Not on file  Occupational History  . 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 Smoking status: Never Smoker  . Smokeless tobacco: Current User    Types: Snuff  Substance and Sexual Activity  . Alcohol use: No  . Drug use: No  . Sexual activity: No    Birth control/protection: Surgical  Other Topics Concern  . Not on file  Social History Narrative  . Not on file    FAMILY HISTORY: Family History  Problem Relation Age of Onset  . Aneurysm Father    Prior to Admission medications   Medication Sig Start Date End Date Taking? Authorizing Provider  magnesium 30 MG tablet Take 30 mg by mouth 2 (two) times daily.   Yes [provider]  albuterol (PROVENTIL) (2.5 MG/3ML) 0.083% nebulizer solution Take 2.5 mg by nebulization every 6 (six) hours as needed for wheezing or shortness of breath.     [provider]  ALPRAZolam Duanne Moron) 1 MG tablet Take 1 mg 3 (three) times daily as needed by mouth for anxiety.     [provider]  azithromycin (ZITHROMAX) 250 MG tablet TAKE TWO (2) TABLETS ON FIRST DAY, THEN TAKE ONE (1) TABLET EACH DAY FOR THE NEXT 4 DAYS. 10/10/17    [provider]  Camphor-Eucalyptus-Menthol (VICKS VAPORUB EX) Apply 1 application daily as needed topically (congestion).    [provider]  Cholecalciferol (VITAMIN D3) 5000 units CAPS Take 5,000 Units by mouth daily.    [provider]  cyclophosphamide (CYTOXAN) 2 g chemo injection Inject once into the vein. Every 2 weeks for 4 cycles    [provider]  dapagliflozin propanediol (FARXIGA) 5 MG TABS tablet Take 5 mg by mouth daily. Only takes on days she takes steroids.    [provider]  dexamethasone (DECADRON) 4 MG tablet Take 2 tablets by mouth once a day on the day after chemotherapy and then take 2 tablets two times a day for 2 days. Take  with food. 10/09/17   Twana First, MD  Diphenhyd-Hydrocort-Nystatin (FIRST-DUKES MOUTHWASH) SUSP Swish and swallow 5 mLs no more than every 4 hours. 12/23/17   Aidden Markovic, Mathis Dad, MD  diphenoxylate-atropine (LOMOTIL) 2.5-0.025 MG tablet Take 1 tablet by mouth 4 (four) times daily as needed for diarrhea or loose stools. 11/21/17   Jacquelin Hawking, NP  DOXOrubicin HCl (ADRIAMYCIN IV) Inject into the vein. Every 2 weeks for 4 cycles    [provider]  HYDROcodone-acetaminophen (NORCO) 7.5-325 MG tablet Take 1 tablet by mouth 4 (four) times daily. 11/07/17   [provider]  hydrOXYzine (ATARAX/VISTARIL) 50 MG tablet Take 50 mg by mouth at bedtime.     [provider]  Lidocaine 0.5 % GEL Apply 1 application topically as needed (for pain).    [provider]  lidocaine-prilocaine (EMLA) cream Apply to affected area once 10/09/17   Twana First, MD  metFORMIN (GLUCOPHAGE) 500 MG tablet Take 500 mg by mouth 2 (two) times daily with a meal.     [provider]  nitroGLYCERIN (NITROSTAT) 0.4 MG SL tablet Place 0.4 mg under the tongue every 5 (five) minutes as needed for chest pain.    [provider]  omeprazole (PRILOSEC) 40 MG capsule Take 40 mg every other day by  mouth. At night    [provider]  ondansetron (ZOFRAN) 8 MG tablet Take 1 tablet (8 mg total) 2 (two) times daily as needed by mouth. Start on the third day after chemotherapy. 10/09/17   Twana First, MD  Pegfilgrastim (NEULASTA ONPRO [place]) Inject into the skin. For 4 cycles    [provider]  pravastatin (PRAVACHOL) 40 MG tablet Take 40 mg by mouth daily.    [provider]  prochlorperazine (COMPAZINE) 10 MG tablet Take 1 tablet (10 mg total) every 6 (six) hours as needed by mouth (Nausea or vomiting). 10/09/17   Twana First, MD  promethazine (PHENERGAN) 25 MG tablet Take 25 mg by mouth every 6 (six) hours as needed for nausea or vomiting.    [provider]  Pseudoeph-Doxylamine-DM-APAP (NYQUIL PO) Take 2 tablets at bedtime as needed by mouth (sinus headaches).    [provider]    Review of Systems - Oncology Negative other than LE swelling and occasional diarrhea.  Mouth sores.     PHYSICAL EXAMINATION  ECOG PERFORMANCE STATUS: 1  Vitals:   12/23/17 1000  BP: (!) 157/95  Pulse: 60  Resp: (!) 22  Temp: 98.4 F (36.9 C)  SpO2: 96%    Patient appears well, alert and oriented x 3, pleasant, cooperative. Vitals are as noted. Neck supple and free of adenopathy, or masses. No thyromegaly. PERLA. Ears, throat are normal. Lungs are clear to auscultation. Heart sounds are normal, no murmurs, clicks, gallops or rubs. Abdomen is soft, no tenderness, masses or organomegaly.  Breasts: Left mastectomy healed well.  Right breast shows no dominant masses, nipple retraction.  Exam chaperoned by female assistant. Extremities are normal. Peripheral pulses are normal.  Neurological exam is normal without focal findings. Skin is normal without suspicious lesions noted.  LABORATORY DATA:  CBC    Component Value Date/Time   WBC 10.2 12/23/2017 1007   RBC 4.31 12/23/2017 1007   HGB 12.4 12/23/2017 1007   HCT 38.7 12/23/2017 1007   PLT 173 12/23/2017  1007   MCV 89.8 12/23/2017 1007   MCH 28.8 12/23/2017 1007   MCHC 32.0 12/23/2017 1007   RDW 15.7 (H) 12/23/2017 1007  LYMPHSABS 2.9 12/23/2017 1007   MONOABS 0.8 12/23/2017 1007   EOSABS 0.2 12/23/2017 1007   BASOSABS 0.1 12/23/2017 1007    CMP     Component Value Date/Time   NA 138 12/23/2017 1007   K 3.9 12/23/2017 1007   CL 102 12/23/2017 1007   CO2 24 12/23/2017 1007   GLUCOSE 142 (H) 12/23/2017 1007   BUN 15 12/23/2017 1007   CREATININE 0.89 12/23/2017 1007   CALCIUM 9.2 12/23/2017 1007   PROT 6.9 12/23/2017 1007   ALBUMIN 3.8 12/23/2017 1007   AST 34 12/23/2017 1007   ALT 35 12/23/2017 1007   ALKPHOS 90 12/23/2017 1007   BILITOT 0.4 12/23/2017 1007   GFRNONAA >60 12/23/2017 1007   GFRAA >60 12/23/2017 1007    PATHOLOGY:Breast, modified radical mastectomy , left - INVASIVE DUCTAL CARCINOMA, GRADE 3, SPANNING 1.8 CM. - HIGH GRADE DUCTAL CARCINOMA IN SITU. - RESECTION MARGINS ARE NEGATIVE. - EIGHT OF EIGHT LYMPH NODES NEGATIVE FOR CARCINOMA (0/8). - SEE ONCOLOGY TABLE. Microscopic Comment BREAST, INVASIVE TUMOR Procedure: Left modified radical mastectomy. Laterality: Left. Tumor Size: 1.8 cm. Histologic Type: Invasive ductal carcinoma. Grade: 3 Tubular Differentiation: 3 Nuclear Pleomorphism: 2 Mitotic Count: 3 Ductal Carcinoma in Situ (DCIS): Present, high grade. Extent of Tumor: Confined to breast parenchyma. Margins: Invasive carcinoma, distance from closest margin: 2 cm (posterior). DCIS, distance from closest margin: 2 cm (posterior). Regional Lymph Nodes: Number of Lymph Nodes Examined: 8 Number of Sentinel Lymph Nodes Examined: 0 Lymph Nodes with Macrometastases: 0 Lymph Nodes with Micrometastases: 0 Lymph Nodes with Isolated Tumor Cells: 0 Breast Prognostic Profile: Performed on biopsy [id number], see below. Will not be repeated. Estrogen Receptor: Negative. Progesterone Receptor: Negative. Her2: Negative (1.30 ratio). 1 of 3 FINAL for  Kelli Wilson, Kelli Wilson ([id number]) Microscopic Comment(continued) Ki-67: 15% Best tumor block for sendout testing: 1D Pathologic Stage Classification (pTNM, AJCC 8th Edition): Primary Tumor (pT): pT1c Regional Lymph Nodes (pN): pN0 Distant Metastases (pM): pMX Comments: None. Vicente Males MD Pathologist, Electronic Signature   ASSESSMENT and THERAPY PLAN:   See above    Orders Placed This Encounter  Procedures  . CBC with Differential    Standing Status:   Standing    Number of Occurrences:   5    Standing Expiration Date:   12/23/2018  . Basic metabolic panel    Standing Status:   Standing    Number of Occurrences:   5    Standing Expiration Date:   12/23/2018    All questions were answered. The patient knows to call the clinic with any problems, questions or concerns. We can certainly see the patient much sooner if necessary. This note was electronically signed. Zoila Shutter, MD 12/23/2017

## 2017-12-23 NOTE — Progress Notes (Signed)
Patient called back to cancer center once she got home to let us know the new diabetic medicine she takes. It is Farxiga 5 mg po daily only on days she takes steroids.

## 2017-12-23 NOTE — Patient Instructions (Signed)
[facility] Discharge Instructions for Patients Receiving Chemotherapy   Beginning January 23rd 2017 lab work for the Cancer Center will be done in the  Main lab at [facility] on 1st floor. If you have a lab appointment with the Cancer Center please come in thru the  Main Entrance and check in at the main information desk   Today you received the following chemotherapy agents   To help prevent nausea and vomiting after your treatment, we encourage you to take your nausea medication     If you develop nausea and vomiting, or diarrhea that is not controlled by your medication, call the clinic.  The clinic phone number is [phone]. Office hours are Monday-Friday 8:30am-5:00pm.  BELOW ARE SYMPTOMS THAT SHOULD BE REPORTED IMMEDIATELY:  *FEVER GREATER THAN 101.0 F  *CHILLS WITH OR WITHOUT FEVER  NAUSEA AND VOMITING THAT IS NOT CONTROLLED WITH YOUR NAUSEA MEDICATION  *UNUSUAL SHORTNESS OF BREATH  *UNUSUAL BRUISING OR BLEEDING  TENDERNESS IN MOUTH AND THROAT WITH OR WITHOUT PRESENCE OF ULCERS  *URINARY PROBLEMS  *BOWEL PROBLEMS  UNUSUAL RASH Items with * indicate a potential emergency and should be followed up as soon as possible. If you have an emergency after office hours please contact your primary care physician or go to the nearest emergency department.  Please call the clinic during office hours if you have any questions or concerns.   You may also contact the Patient Navigator at [phone] should you have any questions or need assistance in obtaining follow up care.      Resources For Cancer Patients and their Caregivers ? American Cancer Society: Can assist with transportation, wigs, general needs, runs Look Good Feel Better.        1-888-227-6333 ? Cancer Care: Provides financial assistance, online support groups, medication/co-pay assistance.  1-800-813-HOPE (4673) ? [facility] Assists [place] cancer  patients and their families through emotional , educational and financial support.  336-427-4357 ? [place] DSS Where to apply for food stamps, Medicaid and utility assistance. 336-342-1394 ? RCATS: Transportation to medical appointments. 336-347-2287 ? Social Security Administration: May apply for disability if have a Stage IV cancer. 336-342-7796 1-800-772-1213 ? [place] Aging, Disability and Transit Services: Assists with nutrition, care and transit needs. 336-349-2343         

## 2017-12-23 NOTE — Progress Notes (Signed)
Ok to treat per per md.   .Kelli Wilson arrived today for ONPRO neulasta on body injector. See MAR for administration details. Injector in place and engaged with green light indicator on flashing. Tolerated application with out problems.  Treatment given per orders. Patient tolerated it well without problems. Vitals stable and discharged home from clinic ambulatory. Follow up as scheduled.

## 2018-01-06 ENCOUNTER — Encounter (HOSPITAL_COMMUNITY): Payer: Self-pay | Admitting: Dietician

## 2018-01-06 ENCOUNTER — Inpatient Hospital Stay (HOSPITAL_COMMUNITY): Payer: Medicare Other | Attending: Hematology and Oncology | Admitting: Internal Medicine

## 2018-01-06 ENCOUNTER — Encounter (HOSPITAL_COMMUNITY): Payer: Self-pay | Admitting: Internal Medicine

## 2018-01-06 ENCOUNTER — Other Ambulatory Visit: Payer: Self-pay

## 2018-01-06 ENCOUNTER — Encounter (HOSPITAL_COMMUNITY): Payer: Self-pay

## 2018-01-06 ENCOUNTER — Ambulatory Visit (HOSPITAL_COMMUNITY)
Admission: RE | Admit: 2018-01-06 | Discharge: 2018-01-06 | Disposition: A | Discharge status: Home / Self Care | Payer: Medicare Other | Source: Ambulatory Visit | Attending: Internal Medicine | Admitting: Internal Medicine

## 2018-01-06 ENCOUNTER — Inpatient Hospital Stay (HOSPITAL_COMMUNITY): Payer: Medicare Other

## 2018-01-06 VITALS — BP 150/93 | HR 100 | Temp 98.2°F | Resp 18 | Ht 67.5 in | Wt 278.0 lb

## 2018-01-06 VITALS — BP 145/69 | HR 98 | Temp 98.4°F | Resp 18

## 2018-01-06 DIAGNOSIS — Z171 Estrogen receptor negative status [ER-]: Secondary | ICD-10-CM

## 2018-01-06 DIAGNOSIS — R197 Diarrhea, unspecified: Secondary | ICD-10-CM

## 2018-01-06 DIAGNOSIS — Z5111 Encounter for antineoplastic chemotherapy: Secondary | ICD-10-CM | POA: Insufficient documentation

## 2018-01-06 DIAGNOSIS — C50412 Malignant neoplasm of upper-outer quadrant of left female breast: Secondary | ICD-10-CM | POA: Diagnosis not present

## 2018-01-06 DIAGNOSIS — C50919 Malignant neoplasm of unspecified site of unspecified female breast: Secondary | ICD-10-CM

## 2018-01-06 LAB — CBC WITH DIFFERENTIAL/PLATELET
BASOS PCT: 1 %
Basophils Absolute: 0.1 10*3/uL (ref 0.0–0.1)
EOS PCT: 0 %
Eosinophils Absolute: 0 10*3/uL (ref 0.0–0.7)
HEMATOCRIT: 34.8 % — AB (ref 36.0–46.0)
Hemoglobin: 11.3 g/dL — ABNORMAL LOW (ref 12.0–15.0)
LYMPHS ABS: 2.8 10*3/uL (ref 0.7–4.0)
Lymphocytes Relative: 21 %
MCH: 29.3 pg (ref 26.0–34.0)
MCHC: 32.5 g/dL (ref 30.0–36.0)
MCV: 90.2 fL (ref 78.0–100.0)
MONO ABS: 1.2 10*3/uL — AB (ref 0.1–1.0)
Monocytes Relative: 9 %
NEUTROS PCT: 69 %
Neutro Abs: 9.3 10*3/uL — ABNORMAL HIGH (ref 1.7–7.7)
PLATELETS: 132 10*3/uL — AB (ref 150–400)
RBC: 3.86 MIL/uL — ABNORMAL LOW (ref 3.87–5.11)
RDW: 16.5 % — ABNORMAL HIGH (ref 11.5–15.5)
WBC: 13.4 10*3/uL — ABNORMAL HIGH (ref 4.0–10.5)

## 2018-01-06 LAB — LACTATE DEHYDROGENASE: LDH: 204 U/L — ABNORMAL HIGH (ref 98–192)

## 2018-01-06 LAB — C DIFFICILE QUICK SCREEN W PCR REFLEX
C Diff antigen: NEGATIVE
C Diff interpretation: NOT DETECTED
C Diff toxin: NEGATIVE

## 2018-01-06 LAB — COMPREHENSIVE METABOLIC PANEL
ALBUMIN: 3.7 g/dL (ref 3.5–5.0)
ALK PHOS: 97 U/L (ref 38–126)
ALT: 35 U/L (ref 14–54)
AST: 28 U/L (ref 15–41)
Anion gap: 12 (ref 5–15)
BUN: 9 mg/dL (ref 6–20)
CO2: 23 mmol/L (ref 22–32)
CREATININE: 0.82 mg/dL (ref 0.44–1.00)
Calcium: 8.7 mg/dL — ABNORMAL LOW (ref 8.9–10.3)
Chloride: 103 mmol/L (ref 101–111)
GFR calc Af Amer: >60 mL/min (ref >60)
GFR calc non Af Amer: >60 mL/min (ref >60)
GLUCOSE: 118 mg/dL — AB (ref 65–99)
Potassium: 3.8 mmol/L (ref 3.5–5.1)
SODIUM: 138 mmol/L (ref 135–145)
Total Bilirubin: 0.4 mg/dL (ref 0.3–1.2)
Total Protein: 6.3 g/dL — ABNORMAL LOW (ref 6.5–8.1)

## 2018-01-06 MED ORDER — SODIUM CHLORIDE 0.9% FLUSH
10.0000 mL | INTRAVENOUS | Status: DC | PRN
  Administered 2018-01-06: 10 mL
  Filled 2018-01-06: qty 10

## 2018-01-06 MED ORDER — DIPHENHYDRAMINE HCL 50 MG/ML IJ SOLN
50.0000 mg | Freq: Once | INTRAMUSCULAR | Status: AC
  Administered 2018-01-06: 50 mg via INTRAVENOUS
  Filled 2018-01-06: qty 1

## 2018-01-06 MED ORDER — SODIUM CHLORIDE 0.9 % IV SOLN
20.0000 mg | Freq: Once | INTRAVENOUS | Status: AC
  Administered 2018-01-06: 20 mg via INTRAVENOUS
  Filled 2018-01-06: qty 2

## 2018-01-06 MED ORDER — DIPHENOXYLATE-ATROPINE 2.5-0.025 MG PO TABS: 1.0000 | ORAL_TABLET | Freq: Four times a day (QID) | ORAL | 0 refills | Status: DC | PRN

## 2018-01-06 MED ORDER — METHYLPREDNISOLONE SODIUM SUCC 125 MG IJ SOLR
125.0000 mg | Freq: Once | INTRAMUSCULAR | Status: AC | PRN
  Administered 2018-01-06: 125 mg via INTRAVENOUS

## 2018-01-06 MED ORDER — FAMOTIDINE IN NACL 20-0.9 MG/50ML-% IV SOLN
20.0000 mg | Freq: Once | INTRAVENOUS | Status: AC
  Administered 2018-01-06: 20 mg via INTRAVENOUS
  Filled 2018-01-06: qty 50

## 2018-01-06 MED ORDER — HEPARIN SOD (PORK) LOCK FLUSH 100 UNIT/ML IV SOLN
500.0000 [IU] | Freq: Once | INTRAVENOUS | Status: AC | PRN
  Administered 2018-01-06: 500 [IU]
  Filled 2018-01-06: qty 5

## 2018-01-06 MED ORDER — SODIUM CHLORIDE 0.9 % IV SOLN
Freq: Once | INTRAVENOUS | Status: AC
  Administered 2018-01-06: 11:00:00 via INTRAVENOUS

## 2018-01-06 MED ORDER — HEPARIN SOD (PORK) LOCK FLUSH 100 UNIT/ML IV SOLN
INTRAVENOUS | Status: AC
  Filled 2018-01-06: qty 5

## 2018-01-06 MED ORDER — PACLITAXEL CHEMO INJECTION 300 MG/50ML
80.0000 mg/m2 | Freq: Once | INTRAVENOUS | Status: AC
  Administered 2018-01-06: 198 mg via INTRAVENOUS
  Filled 2018-01-06: qty 33

## 2018-01-06 MED ORDER — DEXAMETHASONE 4 MG PO TABS: 4.0000 mg | ORAL_TABLET | ORAL | 1 refills | Status: DC

## 2018-01-06 NOTE — Patient Instructions (Signed)
[facility] Discharge Instructions for Patients Receiving Chemotherapy   Beginning January 23rd 2017 lab work for the Cancer Center will be done in the  Main lab at [facility] on 1st floor. If you have a lab appointment with the Cancer Center please come in thru the  Main Entrance and check in at the main information desk   Today you received the following chemotherapy agents Taxol. Follow-up as scheduled. Call clinic for any questions or concerns  To help prevent nausea and vomiting after your treatment, we encourage you to take your nausea medication   If you develop nausea and vomiting, or diarrhea that is not controlled by your medication, call the clinic.  The clinic phone number is [phone]. Office hours are Monday-Friday 8:30am-5:00pm.  BELOW ARE SYMPTOMS THAT SHOULD BE REPORTED IMMEDIATELY:  *FEVER GREATER THAN 101.0 F  *CHILLS WITH OR WITHOUT FEVER  NAUSEA AND VOMITING THAT IS NOT CONTROLLED WITH YOUR NAUSEA MEDICATION  *UNUSUAL SHORTNESS OF BREATH  *UNUSUAL BRUISING OR BLEEDING  TENDERNESS IN MOUTH AND THROAT WITH OR WITHOUT PRESENCE OF ULCERS  *URINARY PROBLEMS  *BOWEL PROBLEMS  UNUSUAL RASH Items with * indicate a potential emergency and should be followed up as soon as possible. If you have an emergency after office hours please contact your primary care physician or go to the nearest emergency department.  Please call the clinic during office hours if you have any questions or concerns.   You may also contact the Patient Navigator at [phone] should you have any questions or need assistance in obtaining follow up care.      Resources For Cancer Patients and their Caregivers ? American Cancer Society: Can assist with transportation, wigs, general needs, runs Look Good Feel Better.        1-888-227-6333 ? Cancer Care: Provides financial assistance, online support groups, medication/co-pay assistance.  1-800-813-HOPE  (4673) ? [facility] Assists [place] cancer patients and their families through emotional , educational and financial support.  336-427-4357 ? [place] DSS Where to apply for food stamps, Medicaid and utility assistance. 336-342-1394 ? RCATS: Transportation to medical appointments. 336-347-2287 ? Social Security Administration: May apply for disability if have a Stage IV cancer. 336-342-7796 1-800-772-1213 ? [place] Aging, Disability and Transit Services: Assists with nutrition, care and transit needs. 336-349-2343         

## 2018-01-06 NOTE — Progress Notes (Signed)
Nutrition Brief Note  RD called by RN. Patient asked RN today for case of Boost. Again, reiterated Boost is not available, only Vanilla Ensure.   Patient accepted the case. This is her first case of Ensure. She is eligible for 2 more.    Burtis Junes RD, LDN, CNSC Clinical Nutrition Pager: [phone] 01/06/2018 3:51 PM

## 2018-01-06 NOTE — Patient Instructions (Addendum)
[place] at [facility] Discharge Instructions  RECOMMENDATIONS MADE BY THE CONSULTANT AND ANY TEST RESULTS WILL BE SENT TO YOUR REFERRING PHYSICIAN.  You were seen today by Dr. Zoila Shutter We are going to do a stool sample today to test for C-diff.  We will notify you of the results. We are going to send you down for an abdominal xray today to see what may be causing your pain and diarrhea We will proceed with treatment today and weekly as planned. I have called in a refill for your Lomotil to [facility]. The provider will see you again in 1 month.  Thank you for choosing [place] at [facility] to provide your oncology and hematology care.  To afford each patient quality time with our provider, please arrive at least 15 minutes before your scheduled appointment time.    If you have a lab appointment with the [facility] please come in thru the  Main Entrance and check in at the main information desk  You need to re-schedule your appointment should you arrive 10 or more minutes late.  We strive to give you quality time with our providers, and arriving late affects you and other patients whose appointments are after yours.  Also, if you no show three or more times for appointments you may be dismissed from the clinic at the providers discretion.     Again, thank you for choosing [facility].  Our hope is that these requests will decrease the amount of time that you wait before being seen by our physicians.       _____________________________________________________________  Should you have questions after your visit to [facility], please contact our office at [phone] between the hours of 8:30 a.m. and 4:30 p.m.  Voicemails left after 4:30 p.m. will not be returned until the following business day.  For prescription refill requests, have your pharmacy contact our office.       Resources For Cancer  Patients and their Caregivers ? American Cancer Society: Can assist with transportation, wigs, general needs, runs Look Good Feel Better.        2243331483 ? Cancer Care: Provides financial assistance, online support groups, medication/co-pay assistance.  1-800-813-HOPE 732-466-6279) ? [place] [place] Assists [place] cancer patients and their families through emotional , educational and financial support.  (254)356-6625 ? [place] DSS Where to apply for food stamps, Medicaid and utility assistance. (316)081-5837 ? RCATS: Transportation to medical appointments. (782) 816-7985 ? Social Security Administration: May apply for disability if have a Stage IV cancer. 515-041-6337 574-640-1331 ? [employer], Disability and Transit Services: Assists with nutrition, care and transit needs. [place] Support Programs: @10RELATIVEDAYS @ > Cancer Support Group  2nd Tuesday of the month 1pm-2pm, Journey Room  > Creative Journey  3rd Tuesday of the month 1130am-1pm, Journey Room  > Look Good Feel Better  1st Wednesday of the month 10am-12 noon, Journey Room (Call [place] to register [phone])

## 2018-01-06 NOTE — Progress Notes (Signed)
1100 Lab and Abd X-ray reviewed with and pt seen by Dr. Walden Field and pt approved for chemo tx today per MD                                                                                 1410 First Taxol infusing at 188 ml/hr and pt c/o feeling flushed likes she's having a hot flash, face looks flushed. Taxol stopped, NS infusing, B/P 141/81, P 98 R 18 O2 sat 97% and T 98.6. 1412 Mike Craze NP present. Pt now feels like she is going to have an asthma attack,resp non-labored Given Solumedrol 125mg  IV per NP orders 1415 NS increased to 500 ml/hr B/P 159/78, P 95 R 20 O2 sat 97% 1420 Pt feeling better, denies any SOB or pain: face less flushed.Spoke with Dr. Walden Field regarding this episode and orders obtained to hold Taxol for today and re-challenge next week with added Decadron the night before and day of the chemo tx 1430 Pt reports feeling much better now B/P 148/73 P 92 R 18 T 98.5 O2 sat 97%. NS continues at 500 ml/hr. Pt informed of Dr. Walden Field orders and to take Decadron 2 tabs the night before and 2 tabs the morning of the next chemo tx and verbalized understanding. 1450 VSS upon discharge. Pt discharged via wheelchair in satisfactory condition accompanied by her daughter

## 2018-01-06 NOTE — Progress Notes (Deleted)
Patient asked for case of Ensure. She had been given a case of Ensure today prior to full chart review given her report of very poor appetite/ wt loss. Going forward, do not feel the patient is really appropriate for further supplementation, especially given her reluctance to attempt any other diet changes.   Burtis Junes RD, LDN, CNSC Clinical Nutrition Pager: [phone] 01/06/2018 4:27 PM

## 2018-01-07 ENCOUNTER — Telehealth (HOSPITAL_COMMUNITY): Payer: Self-pay

## 2018-01-07 ENCOUNTER — Other Ambulatory Visit (HOSPITAL_COMMUNITY): Payer: Self-pay

## 2018-01-07 DIAGNOSIS — R197 Diarrhea, unspecified: Secondary | ICD-10-CM

## 2018-01-07 DIAGNOSIS — Z171 Estrogen receptor negative status [ER-]: Principal | ICD-10-CM

## 2018-01-07 DIAGNOSIS — C50412 Malignant neoplasm of upper-outer quadrant of left female breast: Secondary | ICD-10-CM

## 2018-01-07 MED ORDER — DIPHENOXYLATE-ATROPINE 2.5-0.025 MG PO TABS: 1.0000 | ORAL_TABLET | Freq: Four times a day (QID) | ORAL | 0 refills | Status: DC | PRN

## 2018-01-07 NOTE — Telephone Encounter (Signed)
Patient called for refill on lomotil. Triage nurse from yesterday thought the MD had sent it to the pharmacy but it was not. Reprinted prescription and faxed to patients pharmacy.

## 2018-01-07 NOTE — Telephone Encounter (Signed)
24 hr F/U call Left message on machine to call back for any problems or questions

## 2018-01-12 NOTE — Progress Notes (Signed)
Diagnosis Malignant neoplasm of upper-outer quadrant of left breast in female, estrogen receptor negative ([place]) - Plan: CBC with Differential/Platelet, Comprehensive metabolic panel, Lactate dehydrogenase, DISCONTINUED: 0.9 %  sodium chloride infusion, DISCONTINUED: sodium chloride flush (NS) 0.9 % injection 10 mL, DISCONTINUED: heparin lock flush 100 unit/mL, DISCONTINUED: dexamethasone (DECADRON) 20 mg in sodium chloride 0.9 % 50 mL IVPB, DISCONTINUED: diphenhydrAMINE (BENADRYL) injection 50 mg, DISCONTINUED: famotidine (PEPCID) IVPB 20 mg premix, DISCONTINUED: PACLitaxel (TAXOL) 198 mg in dextrose 5 % 250 mL chemo infusion (</= '80mg'$ /m2), DISCONTINUED: methylPREDNISolone sodium succinate (SOLU-MEDROL) 125 mg/2 mL injection 125 mg  Diarrhea, unspecified type - Plan: DG Abd 1 View, C difficile quick screen w PCR reflex  Staging Cancer Staging Malignant neoplasm of upper-outer quadrant of left female breast (HCC) Staging form: Breast, AJCC 8th Edition - Clinical: cT1c, cN0 - Unsigned - Pathologic: Stage IB (pT1c, pN0, cM0, G3, ER: Negative, PR: Negative, HER2: Negative) - Signed by Twana First, MD on 10/09/2017   Assessment and PlanAssessment and plan:  1.  Stage 1 B triple negative left breast cancer.  Patient is status post left mastectomy.  She was seen initially by Dr. Talbert Cage and recommended for adjuvant therapy with Adriamycin and Cytoxan for 4 cycles followed by Taxol weekly for 12 weeks.  She has completed 4 cycles of AC.  She is here today for C1 of Taxol.  Labs are adequate for chemotherapy.  She will proceed with therapy and will RTC in 3 weeks for followup.   2.  Mouth pain and sores.  Improved.  Pt Rx Magic mouthwash at last visit.    3.  Lymphedema.  Previously discussed with financial counselor facilitating her getting evaluated locally for lymphedema.  4.  Diarrhea.  She reports history of IBS.  Will check stool for C diff.  She will also need GI referral if ongoing symptoms pending  results.    DX:  Triple negative stage IB left breast cancer with high grade DCIS.  Interval history:  67 y.o. postmenopausal female with triple negative high grade invasive ductal carcinoma of the left breast with high grade DCIS. Patient was noted to have a mass in the upper outer quadrant on screening mammogram.  Core biopsy was performed which revealed invasive carcinoma.  She underwent left modified radical mastectomy with lymph node dissection on 09/15/2017.  Path returned as invasive ductal carcinoma, grade 3, spanning 1.8 cm, high-grade ductal carcinoma in situ, resection margins were negative, 8 out of 8 lymph nodes were negative for carcinoma.  Patient states that she was adopted but knew her birth mother.  Her birth mother did not have breast cancer.  She states she had a half sister who had breast cancer in her 3s.  Current Status:  Patient is seen today for evaluation prior to C1 of Taxol previously recommended under the direction of Dr. Talbert Cage.    SUMMARY OF ONCOLOGIC HISTORY: See above  CURRENT THERAPY:AC followed by Taxol  Problem List Patient Active Problem List   Diagnosis Date Noted  . Breast cancer, left breast ([place]) [C50.912] 09/15/2017  . Malignant neoplasm of upper-outer quadrant of left female breast ([place]) [C50.412]   . Hypercholesteremia [E78.00]   . Hypertension [I10]   . Anxiety [F41.9]   . Depression [F32.9]   . Fibromyalgia [M79.7]   . GERD (gastroesophageal reflux disease) [K21.9]   . Byssinosis ([place]) [J66.0]     Past Medical History Past Medical History:  Diagnosis Date  . Anxiety   . Arthritis   . Back  pain   . Byssinosis ([place])    secondary to working in a [employer]  . Chest pain   . Chronic pain   . Depression   . Diabetes mellitus without complication ([place])   . Fibromyalgia   . GERD (gastroesophageal reflux disease)   . Hypercholesteremia   . Hypertension   . Knee pain   . Obesity   . Peptic ulcer    history of - normal GI studies  Sept 2010    Past Surgical History Past Surgical History:  Procedure Laterality Date  . APPENDECTOMY    . bladder rectal repair    . BREAST SURGERY Left    breast biopsy- benign  . CHOLECYSTECTOMY    . DILATION AND CURETTAGE OF UTERUS    . GANGLION CYST EXCISION Left   . MASTECTOMY MODIFIED RADICAL Left 09/15/2017   Procedure: LEFT MODIFIED RADICAL MASTECTOMY;  Surgeon: Aviva Signs, MD;  Location: [facility];  Service: General;  Laterality: Left;  . PORTACATH PLACEMENT Right 10/13/2017   Procedure: INSERTION PORT-A-CATH RIGHT SUBCLAVIAN;  Surgeon: Aviva Signs, MD;  Location: [facility];  Service: General;  Laterality: Right;  . TOTAL ABDOMINAL HYSTERECTOMY      Family History Family History  Problem Relation Age of Onset  . Aneurysm Father      Social History  reports that  has never smoked. Her smokeless tobacco use includes snuff. She reports that she does not drink alcohol or use drugs.  Medications  Current Outpatient Medications:  .  albuterol (PROVENTIL) (2.5 MG/3ML) 0.083% nebulizer solution, Take 2.5 mg by nebulization every 6 (six) hours as needed for wheezing or shortness of breath. , Disp: , Rfl:  .  ALPRAZolam (XANAX) 1 MG tablet, Take 1 mg 3 (three) times daily as needed by mouth for anxiety. , Disp: , Rfl:  .  azithromycin (ZITHROMAX) 250 MG tablet, TAKE TWO (2) TABLETS ON FIRST DAY, THEN TAKE ONE (1) TABLET EACH DAY FOR THE NEXT 4 DAYS., Disp: , Rfl: 0 .  Camphor-Eucalyptus-Menthol (VICKS VAPORUB EX), Apply 1 application daily as needed topically (congestion)., Disp: , Rfl:  .  Cholecalciferol (VITAMIN D3) 5000 units CAPS, Take 5,000 Units by mouth daily., Disp: , Rfl:  .  cyclophosphamide (CYTOXAN) 2 g chemo injection, Inject once into the vein. Every 2 weeks for 4 cycles, Disp: , Rfl:  .  dapagliflozin propanediol (FARXIGA) 5 MG TABS tablet, Take 5 mg by mouth daily. Only takes on days she takes steroids., Disp: , Rfl:  .  dexamethasone (DECADRON) 4 MG tablet, Take  2 tablets by mouth once a day on the day after chemotherapy and then take 2 tablets two times a day for 2 days. Take with food., Disp: 30 tablet, Rfl: 1 .  Diphenhyd-Hydrocort-Nystatin (FIRST-DUKES MOUTHWASH) SUSP, Swish and swallow 5 mLs no more than every 4 hours., Disp: 240 mL, Rfl: 3 .  DOXOrubicin HCl (ADRIAMYCIN IV), Inject into the vein. Every 2 weeks for 4 cycles, Disp: , Rfl:  .  HYDROcodone-acetaminophen (NORCO) 7.5-325 MG tablet, Take 1 tablet by mouth 4 (four) times daily., Disp: , Rfl: 0 .  hydrOXYzine (ATARAX/VISTARIL) 50 MG tablet, Take 50 mg by mouth at bedtime. , Disp: , Rfl:  .  lidocaine (XYLOCAINE) 2 % solution, , Disp: , Rfl:  .  Lidocaine 0.5 % GEL, Apply 1 application topically as needed (for pain)., Disp: , Rfl:  .  lidocaine-prilocaine (EMLA) cream, Apply to affected area once, Disp: 30 g, Rfl: 3 .  magic mouthwash  w/lidocaine SOLN, TAKE ONE TEASPOONFUL (5ML) BY MOUTH SWISH AND SWALLOW NO MORE THAN EVERY FOUR HOURS., Disp: , Rfl: 3 .  magnesium 30 MG tablet, Take 30 mg by mouth 2 (two) times daily., Disp: , Rfl:  .  metFORMIN (GLUCOPHAGE) 500 MG tablet, Take 500 mg by mouth 2 (two) times daily with a meal. , Disp: , Rfl:  .  nitroGLYCERIN (NITROSTAT) 0.4 MG SL tablet, Place 0.4 mg under the tongue every 5 (five) minutes as needed for chest pain., Disp: , Rfl:  .  omeprazole (PRILOSEC) 40 MG capsule, Take 40 mg every other day by mouth. At night, Disp: , Rfl:  .  ondansetron (ZOFRAN) 8 MG tablet, Take 1 tablet (8 mg total) 2 (two) times daily as needed by mouth. Start on the third day after chemotherapy., Disp: 30 tablet, Rfl: 1 .  Pegfilgrastim (NEULASTA ONPRO [place]), Inject into the skin. For 4 cycles, Disp: , Rfl:  .  pravastatin (PRAVACHOL) 40 MG tablet, Take 40 mg by mouth daily., Disp: , Rfl:  .  prochlorperazine (COMPAZINE) 10 MG tablet, Take 1 tablet (10 mg total) every 6 (six) hours as needed by mouth (Nausea or vomiting)., Disp: 30 tablet, Rfl: 1 .  promethazine  (PHENERGAN) 25 MG tablet, Take 25 mg by mouth every 6 (six) hours as needed for nausea or vomiting., Disp: , Rfl:  .  Pseudoeph-Doxylamine-DM-APAP (NYQUIL PO), Take 2 tablets at bedtime as needed by mouth (sinus headaches)., Disp: , Rfl:  .  dexamethasone (DECADRON) 4 MG tablet, Take 1 tablet (4 mg total) by mouth See admin instructions. Take 2 tabs PO night prior to each chemo tx and 2 tabs morning of each chemo tx, Disp: 30 tablet, Rfl: 1 .  diphenoxylate-atropine (LOMOTIL) 2.5-0.025 MG tablet, Take 1 tablet by mouth 4 (four) times daily as needed for diarrhea or loose stools., Disp: 30 tablet, Rfl: 0  Allergies Magnesium sulfate; Other; Iohexol; Ivp dye [iodinated diagnostic agents]; Januvia [sitagliptin]; Latex; Motrin [ibuprofen]; Nsaids; Statins; Sulfa antibiotics; and Adhesive [tape]  Review of Systems Review of Systems - Oncology ROS as per HPI otherwise 12 point ROS is negative.   Physical Exam  Vitals Wt Readings from Last 3 Encounters:  01/06/18 278 lb (126.1 kg)  12/23/17 273 lb (123.8 kg)  12/09/17 277 lb 9.6 oz (125.9 kg)   Temp Readings from Last 3 Encounters:  01/06/18 98.4 F (36.9 C) (Oral)  01/06/18 98.2 F (36.8 C) (Oral)  12/23/17 98.4 F (36.9 C) (Oral)   BP Readings from Last 3 Encounters:  01/06/18 (!) 145/69  01/06/18 (!) 150/93  12/23/17 (!) 157/95   Pulse Readings from Last 3 Encounters:  01/06/18 98  01/06/18 100  12/23/17 60   Constitutional: Well-developed, well-nourished, and in no distress.   HENT:  Head: Normocephalic and atraumatic.  Mouth/Throat: No oropharyngeal exudate. Mucosa moist. Eyes: Pupils are equal, round, and reactive to light. Conjunctivae are normal. No scleral icterus.  Neck: Normal range of motion. Neck supple. No JVD present.  Cardiovascular: Normal rate, regular rhythm and normal heart sounds.  Exam reveals no gallop and no friction rub.   No murmur heard. Pulmonary/Chest: Effort normal and breath sounds normal. No  respiratory distress. No wheezes.No rales.   Right breast shows no dominant masses, nipple retraction.  Exam chaperoned by female assistant Abdominal: Soft. Bowel sounds are normal. No distension. There is no tenderness. There is no guarding.  Musculoskeletal: No edema or tenderness.  Lymphadenopathy: No cervical, axillary or supraclavicular adenopathy.  Neurological:  Alert and oriented to person, place, and time. No cranial nerve deficit.  Skin: Skin is warm and dry. No rash noted. No erythema. No pallor.  Psychiatric: Affect and judgment normal.   Labs Infusion on 01/06/2018  Component Date Value Ref Range Status  . LDH 01/06/2018 204* 98 - 192 U/L Final   Performed at [facility], [address] [place], [place] [address]  . Sodium 01/06/2018 138  135 - 145 mmol/L Final  . Potassium 01/06/2018 3.8  3.5 - 5.1 mmol/L Final  . Chloride 01/06/2018 103  101 - 111 mmol/L Final  . CO2 01/06/2018 23  22 - 32 mmol/L Final  . Glucose, Bld 01/06/2018 118* 65 - 99 mg/dL Final  . BUN 01/06/2018 9  6 - 20 mg/dL Final  . Creatinine, Ser 01/06/2018 0.82  0.44 - 1.00 mg/dL Final  . Calcium 01/06/2018 8.7* 8.9 - 10.3 mg/dL Final  . Total Protein 01/06/2018 6.3* 6.5 - 8.1 g/dL Final  . Albumin 01/06/2018 3.7  3.5 - 5.0 g/dL Final  . AST 01/06/2018 28  15 - 41 U/L Final  . ALT 01/06/2018 35  14 - 54 U/L Final  . Alkaline Phosphatase 01/06/2018 97  38 - 126 U/L Final  . Total Bilirubin 01/06/2018 0.4  0.3 - 1.2 mg/dL Final  . GFR calc non Af Amer 01/06/2018 >60  >60 mL/min Final  . GFR calc Af Amer 01/06/2018 >60  >60 mL/min Final   Comment: (NOTE) The eGFR has been calculated using the CKD EPI equation. This calculation has not been validated in all clinical situations. eGFR's persistently <60 mL/min signify possible Chronic Kidney Disease.   Georgiann Hahn gap 01/06/2018 12  5 - 15 Final   Performed at [facility], [address] [place], [place] [address]  . WBC 01/06/2018 13.4* 4.0 - 10.5 K/uL  Final  . RBC 01/06/2018 3.86* 3.87 - 5.11 MIL/uL Final  . Hemoglobin 01/06/2018 11.3* 12.0 - 15.0 g/dL Final  . HCT 01/06/2018 34.8* 36.0 - 46.0 % Final  . MCV 01/06/2018 90.2  78.0 - 100.0 fL Final  . MCH 01/06/2018 29.3  26.0 - 34.0 pg Final  . MCHC 01/06/2018 32.5  30.0 - 36.0 g/dL Final  . RDW 01/06/2018 16.5* 11.5 - 15.5 % Final  . Platelets 01/06/2018 132* 150 - 400 K/uL Final  . Neutrophils Relative % 01/06/2018 69  % Final  . Lymphocytes Relative 01/06/2018 21  % Final  . Monocytes Relative 01/06/2018 9  % Final  . Eosinophils Relative 01/06/2018 0  % Final  . Basophils Relative 01/06/2018 1  % Final  . Neutro Abs 01/06/2018 9.3* 1.7 - 7.7 K/uL Final  . Lymphs Abs 01/06/2018 2.8  0.7 - 4.0 K/uL Final  . Monocytes Absolute 01/06/2018 1.2* 0.1 - 1.0 K/uL Final  . Eosinophils Absolute 01/06/2018 0.0  0.0 - 0.7 K/uL Final  . Basophils Absolute 01/06/2018 0.1  0.0 - 0.1 K/uL Final  . WBC Morphology 01/06/2018 MILD LEFT SHIFT (1-5% METAS, OCC MYELO, OCC BANDS)   Final   Comment: TOXIC GRANULATION VACUOLATED NEUTROPHILS ATYPICAL LYMPHOCYTES Performed at [facility], [address] [place], [place] [address]   . C Diff antigen 01/06/2018 NEGATIVE  NEGATIVE Final  . C Diff toxin 01/06/2018 NEGATIVE  NEGATIVE Final  . C Diff interpretation 01/06/2018 No C. difficile detected.   Final   Performed at [facility], [address], [place], [place] [address]     PATHOLOGY:Breast, modified radical mastectomy 09/15/2017:   ,  left - INVASIVE DUCTAL CARCINOMA, GRADE 3, SPANNING 1.8 CM. - HIGH GRADE DUCTAL CARCINOMA IN SITU. - RESECTION MARGINS ARE NEGATIVE. - EIGHT OF EIGHT LYMPH NODES NEGATIVE FOR CARCINOMA (0/8). - SEE ONCOLOGY TABLE.   Orders Placed This Encounter  Procedures  . C difficile quick screen w PCR reflex    Standing Status:   Future    Number of Occurrences:   1    Standing Expiration Date:   01/06/2018    Order Specific Question:   Is your patient experiencing loose  or watery stools (3 or more in 24 hours)?    Answer:   Yes    Order Specific Question:   Has the patient received laxatives in the last 24 hours?    Answer:   No    Order Specific Question:   Has a negative Cdiff test resulted in the last 7 days?    Answer:   No  . DG Abd 1 View    Standing Status:   Future    Number of Occurrences:   1    Standing Expiration Date:   01/06/2018    Order Specific Question:   Reason for Exam (SYMPTOM  OR DIAGNOSIS REQUIRED)    Answer:   abdominal pain and diarrhea    Order Specific Question:   Preferred imaging location?    Answer:   [facility]    Order Specific Question:   Radiology Contrast Protocol - do NOT remove file path    Answer:   [email or web address]  . CBC with Differential/Platelet    Standing Status:   Future    Standing Expiration Date:   02/03/2018  . Comprehensive metabolic panel    Standing Status:   Future    Standing Expiration Date:   02/03/2018  . Lactate dehydrogenase    Standing Status:   Future    Standing Expiration Date:   02/03/2018       Zoila Shutter MD

## 2018-01-13 ENCOUNTER — Encounter (HOSPITAL_COMMUNITY): Payer: Self-pay

## 2018-01-13 ENCOUNTER — Encounter: Payer: Self-pay | Admitting: Oncology

## 2018-01-13 ENCOUNTER — Inpatient Hospital Stay (HOSPITAL_COMMUNITY): Payer: Medicare Other

## 2018-01-13 ENCOUNTER — Other Ambulatory Visit: Payer: Self-pay

## 2018-01-13 VITALS — BP 145/74 | HR 93 | Temp 98.0°F | Resp 18 | Wt 273.7 lb

## 2018-01-13 DIAGNOSIS — C50919 Malignant neoplasm of unspecified site of unspecified female breast: Secondary | ICD-10-CM

## 2018-01-13 DIAGNOSIS — C50412 Malignant neoplasm of upper-outer quadrant of left female breast: Secondary | ICD-10-CM

## 2018-01-13 DIAGNOSIS — Z171 Estrogen receptor negative status [ER-]: Principal | ICD-10-CM

## 2018-01-13 DIAGNOSIS — Z5111 Encounter for antineoplastic chemotherapy: Secondary | ICD-10-CM | POA: Diagnosis not present

## 2018-01-13 LAB — CBC WITH DIFFERENTIAL/PLATELET
BASOS ABS: 0 10*3/uL (ref 0.0–0.1)
Basophils Relative: 0 %
Eosinophils Absolute: 0 10*3/uL (ref 0.0–0.7)
Eosinophils Relative: 0 %
HEMATOCRIT: 35.1 % — AB (ref 36.0–46.0)
Hemoglobin: 11.6 g/dL — ABNORMAL LOW (ref 12.0–15.0)
LYMPHS ABS: 1.8 10*3/uL (ref 0.7–4.0)
LYMPHS PCT: 9 %
MCH: 29.5 pg (ref 26.0–34.0)
MCHC: 33 g/dL (ref 30.0–36.0)
MCV: 89.3 fL (ref 78.0–100.0)
Monocytes Absolute: 0.3 10*3/uL (ref 0.1–1.0)
Monocytes Relative: 2 %
NEUTROS ABS: 16.9 10*3/uL — AB (ref 1.7–7.7)
Neutrophils Relative %: 89 %
Platelets: 291 10*3/uL (ref 150–400)
RBC: 3.93 MIL/uL (ref 3.87–5.11)
RDW: 16.2 % — ABNORMAL HIGH (ref 11.5–15.5)
WBC: 19 10*3/uL — ABNORMAL HIGH (ref 4.0–10.5)

## 2018-01-13 LAB — COMPREHENSIVE METABOLIC PANEL
ALBUMIN: 3.7 g/dL (ref 3.5–5.0)
ALT: 30 U/L (ref 14–54)
ANION GAP: 13 (ref 5–15)
AST: 24 U/L (ref 15–41)
Alkaline Phosphatase: 97 U/L (ref 38–126)
BILIRUBIN TOTAL: 0.3 mg/dL (ref 0.3–1.2)
BUN: 16 mg/dL (ref 6–20)
CHLORIDE: 100 mmol/L — AB (ref 101–111)
CO2: 20 mmol/L — ABNORMAL LOW (ref 22–32)
Calcium: 8.9 mg/dL (ref 8.9–10.3)
Creatinine, Ser: 0.84 mg/dL (ref 0.44–1.00)
GFR calc Af Amer: >60 mL/min (ref >60)
GFR calc non Af Amer: >60 mL/min (ref >60)
GLUCOSE: 276 mg/dL — AB (ref 65–99)
POTASSIUM: 3.9 mmol/L (ref 3.5–5.1)
SODIUM: 133 mmol/L — AB (ref 135–145)
Total Protein: 6.9 g/dL (ref 6.5–8.1)

## 2018-01-13 LAB — LACTATE DEHYDROGENASE: LDH: 150 U/L (ref 98–192)

## 2018-01-13 MED ORDER — DIPHENHYDRAMINE HCL 50 MG/ML IJ SOLN
50.0000 mg | Freq: Once | INTRAMUSCULAR | Status: AC
  Administered 2018-01-13: 50 mg via INTRAVENOUS

## 2018-01-13 MED ORDER — HEPARIN SOD (PORK) LOCK FLUSH 100 UNIT/ML IV SOLN
500.0000 [IU] | Freq: Once | INTRAVENOUS | Status: AC | PRN
  Administered 2018-01-13: 500 [IU]
  Filled 2018-01-13 (×2): qty 5

## 2018-01-13 MED ORDER — DIPHENHYDRAMINE HCL 50 MG/ML IJ SOLN
INTRAMUSCULAR | Status: AC
  Filled 2018-01-13: qty 1

## 2018-01-13 MED ORDER — FAMOTIDINE IN NACL 20-0.9 MG/50ML-% IV SOLN
INTRAVENOUS | Status: AC
  Filled 2018-01-13: qty 50

## 2018-01-13 MED ORDER — SODIUM CHLORIDE 0.9 % IV SOLN
Freq: Once | INTRAVENOUS | Status: AC
  Administered 2018-01-13: 12:00:00 via INTRAVENOUS

## 2018-01-13 MED ORDER — FAMOTIDINE IN NACL 20-0.9 MG/50ML-% IV SOLN
20.0000 mg | Freq: Once | INTRAVENOUS | Status: AC
  Administered 2018-01-13: 20 mg via INTRAVENOUS

## 2018-01-13 MED ORDER — SODIUM CHLORIDE 0.9 % IV SOLN
20.0000 mg | Freq: Once | INTRAVENOUS | Status: AC
  Administered 2018-01-13: 20 mg via INTRAVENOUS
  Filled 2018-01-13: qty 2

## 2018-01-13 MED ORDER — PACLITAXEL CHEMO INJECTION 300 MG/50ML
80.0000 mg/m2 | Freq: Once | INTRAVENOUS | Status: AC
  Administered 2018-01-13: 198 mg via INTRAVENOUS
  Filled 2018-01-13: qty 33

## 2018-01-13 NOTE — Progress Notes (Signed)
Tolerated Taxol infusion w/o any adverse effects.  Alert, in no distress.  VSS.  Discharged ambulatory in c/o daughter for transport home.

## 2018-01-20 ENCOUNTER — Inpatient Hospital Stay (HOSPITAL_COMMUNITY): Payer: Medicare Other

## 2018-01-20 ENCOUNTER — Other Ambulatory Visit: Payer: Self-pay

## 2018-01-20 ENCOUNTER — Encounter (HOSPITAL_COMMUNITY): Payer: Self-pay

## 2018-01-20 VITALS — BP 141/85 | HR 106 | Temp 98.1°F | Resp 18 | Wt 274.2 lb

## 2018-01-20 DIAGNOSIS — Z5111 Encounter for antineoplastic chemotherapy: Secondary | ICD-10-CM | POA: Diagnosis not present

## 2018-01-20 DIAGNOSIS — C50412 Malignant neoplasm of upper-outer quadrant of left female breast: Secondary | ICD-10-CM

## 2018-01-20 DIAGNOSIS — Z171 Estrogen receptor negative status [ER-]: Secondary | ICD-10-CM

## 2018-01-20 DIAGNOSIS — C50919 Malignant neoplasm of unspecified site of unspecified female breast: Secondary | ICD-10-CM

## 2018-01-20 LAB — CBC WITH DIFFERENTIAL/PLATELET
BASOS ABS: 0 10*3/uL (ref 0.0–0.1)
BASOS PCT: 0 %
EOS ABS: 0 10*3/uL (ref 0.0–0.7)
Eosinophils Relative: 0 %
HCT: 34.8 % — ABNORMAL LOW (ref 36.0–46.0)
Hemoglobin: 11.7 g/dL — ABNORMAL LOW (ref 12.0–15.0)
LYMPHS ABS: 1.3 10*3/uL (ref 0.7–4.0)
LYMPHS PCT: 10 %
MCH: 30.2 pg (ref 26.0–34.0)
MCHC: 33.6 g/dL (ref 30.0–36.0)
MCV: 89.7 fL (ref 78.0–100.0)
MONOS PCT: 4 %
Monocytes Absolute: 0.5 10*3/uL (ref 0.1–1.0)
NEUTROS ABS: 10.9 10*3/uL — AB (ref 1.7–7.7)
NEUTROS PCT: 86 %
PLATELETS: 239 10*3/uL (ref 150–400)
RBC: 3.88 MIL/uL (ref 3.87–5.11)
RDW: 16.6 % — AB (ref 11.5–15.5)
WBC: 12.8 10*3/uL — AB (ref 4.0–10.5)

## 2018-01-20 LAB — BASIC METABOLIC PANEL
Anion gap: 13 (ref 5–15)
BUN: 19 mg/dL (ref 6–20)
CHLORIDE: 99 mmol/L — AB (ref 101–111)
CO2: 19 mmol/L — AB (ref 22–32)
CREATININE: 0.93 mg/dL (ref 0.44–1.00)
Calcium: 9.6 mg/dL (ref 8.9–10.3)
GFR calc non Af Amer: >60 mL/min (ref >60)
Glucose, Bld: 247 mg/dL — ABNORMAL HIGH (ref 65–99)
Potassium: 4.3 mmol/L (ref 3.5–5.1)
SODIUM: 131 mmol/L — AB (ref 135–145)

## 2018-01-20 LAB — LACTATE DEHYDROGENASE: LDH: 148 U/L (ref 98–192)

## 2018-01-20 MED ORDER — DIPHENHYDRAMINE HCL 50 MG/ML IJ SOLN
50.0000 mg | Freq: Once | INTRAMUSCULAR | Status: AC
  Administered 2018-01-20: 50 mg via INTRAVENOUS
  Filled 2018-01-20: qty 1

## 2018-01-20 MED ORDER — SODIUM CHLORIDE 0.9 % IV SOLN
80.0000 mg/m2 | Freq: Once | INTRAVENOUS | Status: AC
  Administered 2018-01-20: 198 mg via INTRAVENOUS
  Filled 2018-01-20: qty 33

## 2018-01-20 MED ORDER — SODIUM CHLORIDE 0.9% FLUSH
10.0000 mL | INTRAVENOUS | Status: DC | PRN
  Administered 2018-01-20: 10 mL
  Filled 2018-01-20: qty 10

## 2018-01-20 MED ORDER — HEPARIN SOD (PORK) LOCK FLUSH 100 UNIT/ML IV SOLN
500.0000 [IU] | Freq: Once | INTRAVENOUS | Status: AC | PRN
  Administered 2018-01-20: 500 [IU]
  Filled 2018-01-20: qty 5

## 2018-01-20 MED ORDER — SODIUM CHLORIDE 0.9 % IV SOLN
Freq: Once | INTRAVENOUS | Status: AC
  Administered 2018-01-20: 12:00:00 via INTRAVENOUS

## 2018-01-20 MED ORDER — SODIUM CHLORIDE 0.9 % IV SOLN
20.0000 mg | Freq: Once | INTRAVENOUS | Status: AC
  Administered 2018-01-20: 20 mg via INTRAVENOUS
  Filled 2018-01-20: qty 2

## 2018-01-20 MED ORDER — FAMOTIDINE IN NACL 20-0.9 MG/50ML-% IV SOLN
20.0000 mg | Freq: Once | INTRAVENOUS | Status: AC
  Administered 2018-01-20: 20 mg via INTRAVENOUS
  Filled 2018-01-20: qty 50

## 2018-01-20 NOTE — Patient Instructions (Signed)
[facility] Discharge Instructions for Patients Receiving Chemotherapy   Beginning January 23rd 2017 lab work for the [facility] will be done in the  Main lab at [facility] on 1st floor. If you have a lab appointment with the [facility] please come in thru the  Main Entrance and check in at the main information desk   Today you received the following chemotherapy agents   To help prevent nausea and vomiting after your treatment, we encourage you to take your nausea medication  Be If you develop nausea and vomiting, or diarrhea that is not controlled by your medication, call the clinic  The clinic phone number is [phone]. Office hours are Monday-Friday 8:30am-5:00pm.  BELOW ARE SYMPTOMS THAT SHOULD BE REPORTED IMMEDIATELY:  *FEVER GREATER THAN 101.0 F  *CHILLS WITH OR WITHOUT FEVER  NAUSEA AND VOMITING THAT IS NOT CONTROLLED WITH YOUR NAUSEA MEDICATION  *UNUSUAL SHORTNESS OF BREATH  *UNUSUAL BRUISING OR BLEEDING  TENDERNESS IN MOUTH AND THROAT WITH OR WITHOUT PRESENCE OF ULCERS  *URINARY PROBLEMS  *BOWEL PROBLEMS  UNUSUAL RASH Items with * indicate a potential emergency and should be followed up as soon as possible. If you have an emergency after office hours please contact your primary care physician or go to the nearest emergency department.  Please call the clinic during office hours if you have any questions or concerns.   You may also contact the Patient Navigator at [phone] should you have any questions or need assistance in obtaining follow up care.      Resources For Cancer Patients and their Caregivers ? American Cancer Society: Can assist with transportation, wigs, general needs, runs Look Good Feel Better.        780-114-3669 ? Cancer Care: Provides financial assistance, online support groups, medication/co-pay assistance.  1-800-813-HOPE 405-520-7185) ? [facility] Assists [place] cancer  patients and their families through emotional , educational and financial support.  (501)252-2257 ? [place] DSS Where to apply for food stamps, Medicaid and utility assistance. 920 763 5585 ? [facility]: Transportation to medical appointments. (864) 275-4121 ? Social Security Administration: May apply for disability if have a Stage IV cancer. 314-640-6894 434-221-6513 ? [employer], Disability and Transit Services: Assists with nutrition, care and transit needs. [phone]

## 2018-01-20 NOTE — Progress Notes (Signed)
Labs reviewed with md, proceed with treatment .  Patient asked if she still needed to take the decadron the night before and morning of chemotherapy. MD advised and stated it was not necessary any more.   Treatment given per orders. Patient tolerated it well without problems. Vitals stable and discharged home from clinic ambulatory. Follow up as scheduled.

## 2018-01-21 ENCOUNTER — Telehealth: Payer: Self-pay | Admitting: Cardiovascular Disease

## 2018-01-21 NOTE — Telephone Encounter (Signed)
   Lexiscan - chest pain (she may decide to do at [facility]) *To be determined  Patient will contact our office when she is ready to schedule.

## 2018-01-27 ENCOUNTER — Inpatient Hospital Stay (HOSPITAL_COMMUNITY): Payer: Medicare Other | Attending: Hematology and Oncology

## 2018-01-27 ENCOUNTER — Ambulatory Visit (HOSPITAL_COMMUNITY): Payer: Medicare Other | Admitting: Internal Medicine

## 2018-01-27 ENCOUNTER — Encounter (HOSPITAL_COMMUNITY): Payer: Self-pay

## 2018-01-27 VITALS — BP 128/74 | HR 103 | Temp 97.4°F | Resp 20 | Wt 275.4 lb

## 2018-01-27 DIAGNOSIS — G629 Polyneuropathy, unspecified: Secondary | ICD-10-CM | POA: Diagnosis not present

## 2018-01-27 DIAGNOSIS — E876 Hypokalemia: Secondary | ICD-10-CM | POA: Diagnosis not present

## 2018-01-27 DIAGNOSIS — Z171 Estrogen receptor negative status [ER-]: Secondary | ICD-10-CM | POA: Insufficient documentation

## 2018-01-27 DIAGNOSIS — M797 Fibromyalgia: Secondary | ICD-10-CM | POA: Insufficient documentation

## 2018-01-27 DIAGNOSIS — K589 Irritable bowel syndrome without diarrhea: Secondary | ICD-10-CM | POA: Diagnosis not present

## 2018-01-27 DIAGNOSIS — C50412 Malignant neoplasm of upper-outer quadrant of left female breast: Secondary | ICD-10-CM | POA: Diagnosis present

## 2018-01-27 DIAGNOSIS — Z5111 Encounter for antineoplastic chemotherapy: Secondary | ICD-10-CM | POA: Diagnosis not present

## 2018-01-27 DIAGNOSIS — C50919 Malignant neoplasm of unspecified site of unspecified female breast: Secondary | ICD-10-CM

## 2018-01-27 LAB — BASIC METABOLIC PANEL
Anion gap: 12 (ref 5–15)
BUN: 13 mg/dL (ref 6–20)
CALCIUM: 8.7 mg/dL — AB (ref 8.9–10.3)
CO2: 21 mmol/L — AB (ref 22–32)
Chloride: 100 mmol/L — ABNORMAL LOW (ref 101–111)
Creatinine, Ser: 0.88 mg/dL (ref 0.44–1.00)
GFR calc Af Amer: >60 mL/min (ref >60)
GFR calc non Af Amer: >60 mL/min (ref >60)
Glucose, Bld: 211 mg/dL — ABNORMAL HIGH (ref 65–99)
Potassium: 3.7 mmol/L (ref 3.5–5.1)
Sodium: 133 mmol/L — ABNORMAL LOW (ref 135–145)

## 2018-01-27 LAB — CBC WITH DIFFERENTIAL/PLATELET
BASOS PCT: 1 %
Basophils Absolute: 0 10*3/uL (ref 0.0–0.1)
EOS ABS: 0.3 10*3/uL (ref 0.0–0.7)
EOS PCT: 7 %
HCT: 32.8 % — ABNORMAL LOW (ref 36.0–46.0)
Hemoglobin: 10.8 g/dL — ABNORMAL LOW (ref 12.0–15.0)
Lymphocytes Relative: 38 %
Lymphs Abs: 1.8 10*3/uL (ref 0.7–4.0)
MCH: 30.2 pg (ref 26.0–34.0)
MCHC: 32.9 g/dL (ref 30.0–36.0)
MCV: 91.6 fL (ref 78.0–100.0)
Monocytes Absolute: 0.4 10*3/uL (ref 0.1–1.0)
Monocytes Relative: 8 %
NEUTROS ABS: 2.3 10*3/uL (ref 1.7–7.7)
Neutrophils Relative %: 46 %
PLATELETS: 207 10*3/uL (ref 150–400)
RBC: 3.58 MIL/uL — ABNORMAL LOW (ref 3.87–5.11)
RDW: 16.2 % — ABNORMAL HIGH (ref 11.5–15.5)
WBC: 4.8 10*3/uL (ref 4.0–10.5)

## 2018-01-27 LAB — LACTATE DEHYDROGENASE: LDH: 152 U/L (ref 98–192)

## 2018-01-27 MED ORDER — HEPARIN SOD (PORK) LOCK FLUSH 100 UNIT/ML IV SOLN
500.0000 [IU] | Freq: Once | INTRAVENOUS | Status: AC | PRN
  Administered 2018-01-27: 500 [IU]

## 2018-01-27 MED ORDER — SODIUM CHLORIDE 0.9 % IV SOLN
80.0000 mg/m2 | Freq: Once | INTRAVENOUS | Status: AC
  Administered 2018-01-27: 198 mg via INTRAVENOUS
  Filled 2018-01-27: qty 33

## 2018-01-27 MED ORDER — FAMOTIDINE IN NACL 20-0.9 MG/50ML-% IV SOLN
20.0000 mg | Freq: Once | INTRAVENOUS | Status: AC
  Administered 2018-01-27: 20 mg via INTRAVENOUS

## 2018-01-27 MED ORDER — SODIUM CHLORIDE 0.9 % IV SOLN
Freq: Once | INTRAVENOUS | Status: AC
  Administered 2018-01-27: 11:00:00 via INTRAVENOUS

## 2018-01-27 MED ORDER — DIPHENHYDRAMINE HCL 50 MG/ML IJ SOLN
INTRAMUSCULAR | Status: AC
  Filled 2018-01-27: qty 1

## 2018-01-27 MED ORDER — SODIUM CHLORIDE 0.9% FLUSH
10.0000 mL | INTRAVENOUS | Status: DC | PRN
  Administered 2018-01-27: 10 mL
  Filled 2018-01-27: qty 10

## 2018-01-27 MED ORDER — FAMOTIDINE IN NACL 20-0.9 MG/50ML-% IV SOLN
INTRAVENOUS | Status: AC
  Filled 2018-01-27: qty 50

## 2018-01-27 MED ORDER — DIPHENHYDRAMINE HCL 50 MG/ML IJ SOLN
50.0000 mg | Freq: Once | INTRAMUSCULAR | Status: AC
  Administered 2018-01-27: 50 mg via INTRAVENOUS

## 2018-01-27 MED ORDER — SODIUM CHLORIDE 0.9 % IV SOLN
20.0000 mg | Freq: Once | INTRAVENOUS | Status: AC
  Administered 2018-01-27: 20 mg via INTRAVENOUS
  Filled 2018-01-27: qty 2

## 2018-01-27 NOTE — Progress Notes (Signed)
ECHO 11/21/2017  Patient instructed with last visit to stop Decadron for chemotherapy.   Patient stated no changes in fatigue or SOB.  History of COPD and gets SOB with exertion. Stated cramps in left groin area with bowel movement but went away after her bowel movement.  No changes in neuropathy.  Can use zippers, small buttons, and jar lids.    Reviewed lab work and blood sugar 211 today and taking metformin and farxiva with Dr. Walden Field.  Ok to treat today.   Patient tolerated chemotherapy with no complaints voiced.  Port site clean and dry with no bruising or swelling noted at site.  No complaints of pain with flush.  Band aid applied.  VSS with discharge and left ambulatory with no s/s of distress noted.

## 2018-01-27 NOTE — Patient Instructions (Signed)
[facility] Discharge Instructions for Patients Receiving Chemotherapy  Today you received the following chemotherapy agents taxol.    If you develop nausea and vomiting that is not controlled by your nausea medication, call the clinic.   BELOW ARE SYMPTOMS THAT SHOULD BE REPORTED IMMEDIATELY:  *FEVER GREATER THAN 100.5 F  *CHILLS WITH OR WITHOUT FEVER  NAUSEA AND VOMITING THAT IS NOT CONTROLLED WITH YOUR NAUSEA MEDICATION  *UNUSUAL SHORTNESS OF BREATH  *UNUSUAL BRUISING OR BLEEDING  TENDERNESS IN MOUTH AND THROAT WITH OR WITHOUT PRESENCE OF ULCERS  *URINARY PROBLEMS  *BOWEL PROBLEMS  UNUSUAL RASH Items with * indicate a potential emergency and should be followed up as soon as possible.  Feel free to call the clinic should you have any questions or concerns. The clinic phone number is [phone].  Please show the [facility] at check-in to the Emergency Department and triage nurse.

## 2018-02-03 ENCOUNTER — Ambulatory Visit (HOSPITAL_COMMUNITY)
Admission: RE | Admit: 2018-02-03 | Discharge: 2018-02-03 | Disposition: A | Discharge status: Home / Self Care | Payer: Medicare Other | Source: Ambulatory Visit | Attending: Hematology | Admitting: Hematology

## 2018-02-03 ENCOUNTER — Other Ambulatory Visit: Payer: Self-pay

## 2018-02-03 ENCOUNTER — Inpatient Hospital Stay (HOSPITAL_COMMUNITY): Payer: Medicare Other

## 2018-02-03 ENCOUNTER — Ambulatory Visit (INDEPENDENT_AMBULATORY_CARE_PROVIDER_SITE_OTHER): Payer: Medicare Other | Admitting: Internal Medicine

## 2018-02-03 ENCOUNTER — Inpatient Hospital Stay (HOSPITAL_BASED_OUTPATIENT_CLINIC_OR_DEPARTMENT_OTHER): Payer: Medicare Other | Admitting: Hematology

## 2018-02-03 ENCOUNTER — Encounter (HOSPITAL_COMMUNITY): Payer: Self-pay

## 2018-02-03 ENCOUNTER — Encounter (HOSPITAL_COMMUNITY): Payer: Self-pay | Admitting: Hematology

## 2018-02-03 VITALS — BP 147/75 | HR 102 | Temp 97.5°F | Resp 18 | Wt 278.2 lb

## 2018-02-03 VITALS — BP 137/89 | HR 108 | Temp 98.3°F | Resp 18 | Wt 278.2 lb

## 2018-02-03 DIAGNOSIS — Z171 Estrogen receptor negative status [ER-]: Secondary | ICD-10-CM

## 2018-02-03 DIAGNOSIS — C50412 Malignant neoplasm of upper-outer quadrant of left female breast: Secondary | ICD-10-CM

## 2018-02-03 DIAGNOSIS — C50912 Malignant neoplasm of unspecified site of left female breast: Secondary | ICD-10-CM | POA: Diagnosis not present

## 2018-02-03 DIAGNOSIS — G629 Polyneuropathy, unspecified: Secondary | ICD-10-CM

## 2018-02-03 DIAGNOSIS — C50919 Malignant neoplasm of unspecified site of unspecified female breast: Secondary | ICD-10-CM

## 2018-02-03 DIAGNOSIS — T451X5A Adverse effect of antineoplastic and immunosuppressive drugs, initial encounter: Principal | ICD-10-CM

## 2018-02-03 DIAGNOSIS — G620 Drug-induced polyneuropathy: Secondary | ICD-10-CM

## 2018-02-03 DIAGNOSIS — Z5111 Encounter for antineoplastic chemotherapy: Secondary | ICD-10-CM | POA: Diagnosis not present

## 2018-02-03 LAB — LACTATE DEHYDROGENASE: LDH: 154 U/L (ref 98–192)

## 2018-02-03 LAB — CBC WITH DIFFERENTIAL/PLATELET
Basophils Absolute: 0.1 10*3/uL (ref 0.0–0.1)
Basophils Relative: 1 %
EOS ABS: 0.3 10*3/uL (ref 0.0–0.7)
EOS PCT: 6 %
HCT: 32.5 % — ABNORMAL LOW (ref 36.0–46.0)
Hemoglobin: 10.5 g/dL — ABNORMAL LOW (ref 12.0–15.0)
LYMPHS ABS: 1.7 10*3/uL (ref 0.7–4.0)
LYMPHS PCT: 37 %
MCH: 30.1 pg (ref 26.0–34.0)
MCHC: 32.3 g/dL (ref 30.0–36.0)
MCV: 93.1 fL (ref 78.0–100.0)
MONO ABS: 0.4 10*3/uL (ref 0.1–1.0)
Monocytes Relative: 8 %
Neutro Abs: 2.2 10*3/uL (ref 1.7–7.7)
Neutrophils Relative %: 48 %
PLATELETS: 225 10*3/uL (ref 150–400)
RBC: 3.49 MIL/uL — AB (ref 3.87–5.11)
RDW: 16.3 % — AB (ref 11.5–15.5)
WBC: 4.6 10*3/uL (ref 4.0–10.5)

## 2018-02-03 LAB — COMPREHENSIVE METABOLIC PANEL
ALT: 29 U/L (ref 14–54)
ANION GAP: 12 (ref 5–15)
AST: 26 U/L (ref 15–41)
Albumin: 3.3 g/dL — ABNORMAL LOW (ref 3.5–5.0)
Alkaline Phosphatase: 67 U/L (ref 38–126)
BUN: 10 mg/dL (ref 6–20)
CHLORIDE: 105 mmol/L (ref 101–111)
CO2: 21 mmol/L — ABNORMAL LOW (ref 22–32)
CREATININE: 0.85 mg/dL (ref 0.44–1.00)
Calcium: 8.7 mg/dL — ABNORMAL LOW (ref 8.9–10.3)
Glucose, Bld: 155 mg/dL — ABNORMAL HIGH (ref 65–99)
POTASSIUM: 3.7 mmol/L (ref 3.5–5.1)
SODIUM: 138 mmol/L (ref 135–145)
Total Bilirubin: 0.6 mg/dL (ref 0.3–1.2)
Total Protein: 6.1 g/dL — ABNORMAL LOW (ref 6.5–8.1)

## 2018-02-03 MED ORDER — HEPARIN SOD (PORK) LOCK FLUSH 100 UNIT/ML IV SOLN
500.0000 [IU] | Freq: Once | INTRAVENOUS | Status: AC | PRN
  Administered 2018-02-03: 500 [IU]

## 2018-02-03 MED ORDER — FAMOTIDINE IN NACL 20-0.9 MG/50ML-% IV SOLN
20.0000 mg | Freq: Once | INTRAVENOUS | Status: AC
  Administered 2018-02-03: 20 mg via INTRAVENOUS
  Filled 2018-02-03: qty 50

## 2018-02-03 MED ORDER — DIPHENHYDRAMINE HCL 50 MG/ML IJ SOLN
50.0000 mg | Freq: Once | INTRAMUSCULAR | Status: AC
  Administered 2018-02-03: 50 mg via INTRAVENOUS
  Filled 2018-02-03: qty 1

## 2018-02-03 MED ORDER — SODIUM CHLORIDE 0.9 % IV SOLN
64.0000 mg/m2 | Freq: Once | INTRAVENOUS | Status: AC
  Administered 2018-02-03: 156 mg via INTRAVENOUS
  Filled 2018-02-03: qty 26

## 2018-02-03 MED ORDER — SODIUM CHLORIDE 0.9 % IV SOLN
20.0000 mg | Freq: Once | INTRAVENOUS | Status: AC
  Administered 2018-02-03: 20 mg via INTRAVENOUS
  Filled 2018-02-03: qty 2

## 2018-02-03 MED ORDER — GABAPENTIN 300 MG PO CAPS: 300.0000 mg | ORAL_CAPSULE | Freq: Three times a day (TID) | ORAL | 0 refills | Status: DC

## 2018-02-03 MED ORDER — SODIUM CHLORIDE 0.9% FLUSH
10.0000 mL | INTRAVENOUS | Status: DC | PRN
  Administered 2018-02-03: 10 mL
  Filled 2018-02-03: qty 10

## 2018-02-03 MED ORDER — SODIUM CHLORIDE 0.9 % IV SOLN
Freq: Once | INTRAVENOUS | Status: AC
  Administered 2018-02-03: 500 mL via INTRAVENOUS
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
## 2018-02-03 NOTE — Progress Notes (Signed)
[place], [place] [address]   CLINIC:  Medical Oncology/Hematology  PCP:  Lucia Gaskins, MD [place] [id number] [phone]   REASON FOR VISIT:  Follow-up for chemotherapy.   CURRENT THERAPY: She is here for week 5 of paclitaxel.  BRIEF ONCOLOGIC HISTORY: Patient received 4 cycles of Adriamycin and cyclophosphamide and was started on weekly paclitaxel.      INTERVAL HISTORY:  Kelli Wilson 67 y.o. female returns for routine follow-up and consideration for next cycle of chemotherapy.   Due for cycle #5  of 12 today.   She complains of tiredness, but is able to do all her activities.  She reports that her sugars go up for 1-2 days after each cycle of chemotherapy.  She reports pain in her legs which last about 1-2 days after each cycle of chemotherapy.  She has developed continuous numbness in her toes of both feet for the last 2-3 weeks.  She denies any tingling or numbness in the fingertips.  She had some neuropathic pain in the left foot from her disc problems even prior to start of chemotherapy.  She has taken gabapentin for it which did not help her that much.  She also complained of tenderness in the right clavicle region for the last 1 week.  REVIEW OF SYSTEMS:  Review of Systems  Constitutional: Positive for fatigue. Negative for appetite change, chills and fever.  HENT:   Negative for mouth sores and nosebleeds.   Respiratory: Negative.   Cardiovascular: Negative.   Gastrointestinal: Negative.   Musculoskeletal: Positive for neck pain. Negative for gait problem.  Skin: Negative for itching and rash.  Neurological: Positive for dizziness and numbness. Negative for gait problem.     PAST MEDICAL/SURGICAL HISTORY:  Past Medical History:  Diagnosis Date  . Anxiety   . Arthritis   . Back pain   . Byssinosis ([place])    secondary to working in a [employer]  . Chest pain   . Chronic pain   .  Depression   . Diabetes mellitus without complication ([place])   . Fibromyalgia   . GERD (gastroesophageal reflux disease)   . Hypercholesteremia   . Hypertension   . Knee pain   . Obesity   . Peptic ulcer    history of - normal GI studies Sept 2010   Past Surgical History:  Procedure Laterality Date  . APPENDECTOMY    . bladder rectal repair    . BREAST SURGERY Left    breast biopsy- benign  . CHOLECYSTECTOMY    . DILATION AND CURETTAGE OF UTERUS    . GANGLION CYST EXCISION Left   . MASTECTOMY MODIFIED RADICAL Left 09/15/2017   Procedure: LEFT MODIFIED RADICAL MASTECTOMY;  Surgeon: Aviva Signs, MD;  Location: [facility];  Service: General;  Laterality: Left;  . PORTACATH PLACEMENT Right 10/13/2017   Procedure: INSERTION PORT-A-CATH RIGHT SUBCLAVIAN;  Surgeon: Aviva Signs, MD;  Location: [facility];  Service: General;  Laterality: Right;  . TOTAL ABDOMINAL HYSTERECTOMY       SOCIAL HISTORY:  Social History   Socioeconomic History  . Marital status: Married    Spouse name: Not on file  . Number of children: Not on file  . Years of education: Not on file  . Highest education level: Not on file  Social Needs  . Financial resource strain: Not on file  . Food insecurity - worry: Not on file  . Food insecurity - inability:  Not on file  . Transportation needs - medical: Not on file  . Transportation needs - non-medical: Not on file  Occupational History  . 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 Smoking status: Never Smoker  . Smokeless tobacco: Current User    Types: Snuff  Substance and Sexual Activity  . Alcohol use: No  . Drug use: No  . Sexual activity: No    Birth control/protection: Surgical  Other Topics Concern  . Not on file  Social History Narrative  . Not on file    FAMILY HISTORY:  Family History  Problem Relation Age of Onset  . Aneurysm Father     CURRENT MEDICATIONS:  Outpatient Encounter  Medications as of 02/03/2018  Medication Sig Note  . albuterol (PROVENTIL) (2.5 MG/3ML) 0.083% nebulizer solution Take 2.5 mg by nebulization every 6 (six) hours as needed for wheezing or shortness of breath.    . ALPRAZolam (XANAX) 1 MG tablet Take 1 mg 3 (three) times daily as needed by mouth for anxiety.    . Camphor-Eucalyptus-Menthol (VICKS VAPORUB EX) Apply 1 application daily as needed topically (congestion).   . Cholecalciferol (VITAMIN D3) 5000 units CAPS Take 5,000 Units by mouth daily.   . dapagliflozin propanediol (FARXIGA) 5 MG TABS tablet Take 5 mg by mouth daily. Only takes on days she takes steroids.   Marland Kitchen dexamethasone (DECADRON) 4 MG tablet Take 2 tablets by mouth once a day on the day after chemotherapy and then take 2 tablets two times a day for 2 days. Take with food. (Patient not taking: Reported on 02/03/2018) 10/09/2017: Hasnt started  . dexamethasone (DECADRON) 4 MG tablet Take 1 tablet (4 mg total) by mouth See admin instructions. Take 2 tabs PO night prior to each chemo tx and 2 tabs morning of each chemo tx (Patient not taking: Reported on 02/03/2018)   . Diphenhyd-Hydrocort-Nystatin (FIRST-DUKES MOUTHWASH) SUSP Swish and swallow 5 mLs no more than every 4 hours.   . diphenoxylate-atropine (LOMOTIL) 2.5-0.025 MG tablet Take 1 tablet by mouth 4 (four) times daily as needed for diarrhea or loose stools.   Marland Kitchen HYDROcodone-acetaminophen (NORCO) 7.5-325 MG tablet Take 1 tablet by mouth 4 (four) times daily.   . hydrOXYzine (ATARAX/VISTARIL) 50 MG tablet Take 50 mg by mouth at bedtime.  02/03/2018: Takes as needed.  . lidocaine (XYLOCAINE) 2 % solution    . Lidocaine 0.5 % GEL Apply 1 application topically as needed (for pain).   Marland Kitchen lidocaine-prilocaine (EMLA) cream Apply to affected area once 10/09/2017: Hasnt started  . magic mouthwash w/lidocaine SOLN TAKE ONE TEASPOONFUL (5ML) BY MOUTH SWISH AND SWALLOW NO MORE THAN EVERY FOUR HOURS.   . magnesium 30 MG tablet Take 30 mg by mouth 2  (two) times daily.   . metFORMIN (GLUCOPHAGE) 500 MG tablet Take 500 mg by mouth 2 (two) times daily with a meal.    . nitroGLYCERIN (NITROSTAT) 0.4 MG SL tablet Place 0.4 mg under the tongue every 5 (five) minutes as needed for chest pain.   Marland Kitchen omeprazole (PRILOSEC) 40 MG capsule Take 40 mg every other day by mouth. At night   . ondansetron (ZOFRAN) 8 MG tablet Take 1 tablet (8 mg total) 2 (two) times daily as needed by mouth. Start on the third day after chemotherapy. 10/09/2017: Hasnt started  . pravastatin (PRAVACHOL) 40 MG tablet Take 40 mg by mouth daily.   . prochlorperazine (COMPAZINE) 10 MG tablet Take 1 tablet (  10 mg total) every 6 (six) hours as needed by mouth (Nausea or vomiting). 10/09/2017: Hasnt started  . promethazine (PHENERGAN) 25 MG tablet Take 25 mg by mouth every 6 (six) hours as needed for nausea or vomiting.   . Pseudoeph-Doxylamine-DM-APAP (NYQUIL PO) Take 2 tablets at bedtime as needed by mouth (sinus headaches).    No facility-administered encounter medications on file as of 02/03/2018.     ALLERGIES:  Allergies  Allergen Reactions  . Magnesium Sulfate Shortness Of Breath    Experienced shortness of breath and chest pain during mag sulfate infusion. -11/21/17 -gwd  . Other Anaphylaxis and Other (See Comments)    Lysol and other cleaning products  . Iohexol Other (See Comments)     Code: HIVES, Desc: PT STATES SHE WAS GIVEN IV DYE AT [facility] AND BROKE OUT IN HIVES, RASH, AND HAD TO BE HOSPITALIZED DUE TO REACTION   . Ivp Dye [Iodinated Diagnostic Agents] Other (See Comments)    Unknown  . Januvia [Sitagliptin]     Stomach pains   . Latex Itching  . Motrin [Ibuprofen] Other (See Comments)    Unknown  . Nsaids Other (See Comments)    Unknown  . Statins Other (See Comments)    Unknown  . Sulfa Antibiotics Other (See Comments)    Unknown  . Adhesive [Tape] Rash     PHYSICAL EXAM:  ECOG Performance status: 1   Vitals:   02/03/18 1037  BP: 137/89    Pulse: (!) 108  Resp: 18  Temp: 98.3 F (36.8 C)  SpO2: 98%   Filed Weights   02/03/18 1037  Weight: 278 lb 3.2 oz (126.2 kg)    Physical Exam  Constitutional: She is well-developed, well-nourished, and in no distress.  HENT:  Mouth/Throat: No oropharyngeal exudate.  Eyes: No scleral icterus.  Neck: Normal range of motion.  Abdominal: Soft. She exhibits no distension. There is no tenderness.  Musculoskeletal: She exhibits tenderness.     LABORATORY DATA:  I have reviewed the labs as listed.  CBC    Component Value Date/Time   WBC 4.6 02/03/2018 1015   RBC 3.49 (L) 02/03/2018 1015   HGB 10.5 (L) 02/03/2018 1015   HCT 32.5 (L) 02/03/2018 1015   PLT 225 02/03/2018 1015   MCV 93.1 02/03/2018 1015   MCH 30.1 02/03/2018 1015   MCHC 32.3 02/03/2018 1015   RDW 16.3 (H) 02/03/2018 1015   LYMPHSABS 1.7 02/03/2018 1015   MONOABS 0.4 02/03/2018 1015   EOSABS 0.3 02/03/2018 1015   BASOSABS 0.1 02/03/2018 1015   CMP Latest Ref Rng & Units 02/03/2018 01/27/2018 01/20/2018  Glucose 65 - 99 mg/dL 155(H) 211(H) 247(H)  BUN 6 - 20 mg/dL 10 13 19   Creatinine 0.44 - 1.00 mg/dL 0.85 0.88 0.93  Sodium 135 - 145 mmol/L 138 133(L) 131(L)  Potassium 3.5 - 5.1 mmol/L 3.7 3.7 4.3  Chloride 101 - 111 mmol/L 105 100(L) 99(L)  CO2 22 - 32 mmol/L 21(L) 21(L) 19(L)  Calcium 8.9 - 10.3 mg/dL 8.7(L) 8.7(L) 9.6  Total Protein 6.5 - 8.1 g/dL 6.1(L) - -  Total Bilirubin 0.3 - 1.2 mg/dL 0.6 - -  Alkaline Phos 38 - 126 U/L 67 - -  AST 15 - 41 U/L 26 - -  ALT 14 - 54 U/L 29 - -    PENDING LABS:    DIAGNOSTIC IMAGING:  *The following radiologic images and reports have been reviewed independently and agree with below findings.  I have reviewed her  chest x-ray which shows the port is in place.    ASSESSMENT & PLAN:   1. Stage Ib (pT1c pN0) triple negative left breast cancer: She is here for week 5 of paclitaxel.  She is experiencing a lot of fatigue with chemotherapy.  She has leg pains about  1-2 days after each cycle of chemotherapy.  We will cut back on paclitaxel dose by 20% starting today.  I will see her every other week.  She will continue weekly paclitaxel at a reduced dose.  2.  Peripheral neuropathy: She has developed constant numbness in bilateral toes for the last 2-3 weeks.  She used to have left foot numbness prior to start of chemotherapy secondary to disc problems.  I would start her on gabapentin 300 mg 3 times a day.  Apparently gabapentin did not help for her left foot neuropathy in the past.  She only took it for 1 week.  Hence we will try it again.  If it does not help, we will switch to Lyrica.  I will cut back on paclitaxel dose by 20%.  3.  Right clavicle tenderness: She complained of tenderness in the right clavicular region and thinks it is coming from the port.  I have recommended portogram.  However she is allergic to contrast.  We have ordered a chest x-ray in the office today.  Bony structures were within normal limits.  Port also appears to be within normal limits.  Hence we will proceed with chemotherapy.       Orders placed this encounter:  Orders Placed This Encounter  Procedures  . DG Chest 2 View      [place] [phone]

## 2018-02-03 NOTE — Patient Instructions (Signed)
[facility] at [facility] Discharge Instructions  Today you saw Dr. K.   Thank you for choosing [facility] at [facility] to provide your oncology and hematology care.  To afford each patient quality time with our provider, please arrive at least 15 minutes before your scheduled appointment time.   If you have a lab appointment with the Cancer Center please come in thru the  Main Entrance and check in at the main information desk  You need to re-schedule your appointment should you arrive 10 or more minutes late.  We strive to give you quality time with our providers, and arriving late affects you and other patients whose appointments are after yours.  Also, if you no show three or more times for appointments you may be dismissed from the clinic at the providers discretion.     Again, thank you for choosing [facility].  Our hope is that these requests will decrease the amount of time that you wait before being seen by our physicians.       _____________________________________________________________  Should you have questions after your visit to [facility], please contact our office at [phone] between the hours of 8:30 a.m. and 4:30 p.m.  Voicemails left after 4:30 p.m. will not be returned until the following business day.  For prescription refill requests, have your pharmacy contact our office.       Resources For Cancer Patients and their Caregivers ? American Cancer Society: Can assist with transportation, wigs, general needs, runs Look Good Feel Better.        1-888-227-6333 ? Cancer Care: Provides financial assistance, online support groups, medication/co-pay assistance.  1-800-813-HOPE (4673) ? [facility] Assists [place] cancer patients and their families through emotional , educational and financial support.  336-427-4357 ? [place] DSS Where to apply for food  stamps, Medicaid and utility assistance. 336-342-1394 ? [facility]: Transportation to medical appointments. 336-347-2287 ? Social Security Administration: May apply for disability if have a Stage IV cancer. 336-342-7796 1-800-772-1213 ? [place] Aging, Disability and Transit Services: Assists with nutrition, care and transit needs. 336-349-2343  Cancer Center Support Programs:   > Cancer Support Group  2nd Tuesday of the month 1pm-2pm, Journey Room   > Creative Journey  3rd Tuesday of the month 1130am-1pm, Journey Room    

## 2018-02-03 NOTE — Patient Instructions (Signed)
[facility] Discharge Instructions for Patients Receiving Chemotherapy  Today you received the following chemotherapy agents taxol.     If you develop nausea and vomiting that is not controlled by your nausea medication, call the clinic.   BELOW ARE SYMPTOMS THAT SHOULD BE REPORTED IMMEDIATELY:  *FEVER GREATER THAN 100.5 F  *CHILLS WITH OR WITHOUT FEVER  NAUSEA AND VOMITING THAT IS NOT CONTROLLED WITH YOUR NAUSEA MEDICATION  *UNUSUAL SHORTNESS OF BREATH  *UNUSUAL BRUISING OR BLEEDING  TENDERNESS IN MOUTH AND THROAT WITH OR WITHOUT PRESENCE OF ULCERS  *URINARY PROBLEMS  *BOWEL PROBLEMS  UNUSUAL RASH Items with * indicate a potential emergency and should be followed up as soon as possible.  Feel free to call the clinic should you have any questions or concerns. The clinic phone number is [phone].  Please show the [facility] at check-in to the Emergency Department and triage nurse.

## 2018-02-10 ENCOUNTER — Encounter (HOSPITAL_COMMUNITY): Payer: Self-pay

## 2018-02-10 ENCOUNTER — Other Ambulatory Visit: Payer: Self-pay

## 2018-02-10 ENCOUNTER — Other Ambulatory Visit (HOSPITAL_COMMUNITY): Payer: Self-pay | Admitting: Family Medicine

## 2018-02-10 ENCOUNTER — Ambulatory Visit (HOSPITAL_COMMUNITY)
Admission: RE | Admit: 2018-02-10 | Discharge: 2018-02-10 | Disposition: A | Discharge status: Home / Self Care | Payer: Medicare Other | Source: Ambulatory Visit | Attending: Family Medicine | Admitting: Family Medicine

## 2018-02-10 ENCOUNTER — Inpatient Hospital Stay (HOSPITAL_COMMUNITY): Payer: Medicare Other

## 2018-02-10 ENCOUNTER — Encounter (HOSPITAL_COMMUNITY): Payer: Self-pay | Admitting: Dietician

## 2018-02-10 VITALS — BP 129/71 | HR 101 | Temp 98.0°F | Resp 18 | Wt 276.2 lb

## 2018-02-10 DIAGNOSIS — Z5111 Encounter for antineoplastic chemotherapy: Secondary | ICD-10-CM | POA: Diagnosis not present

## 2018-02-10 DIAGNOSIS — Z171 Estrogen receptor negative status [ER-]: Secondary | ICD-10-CM

## 2018-02-10 DIAGNOSIS — M5136 Other intervertebral disc degeneration, lumbar region: Secondary | ICD-10-CM | POA: Insufficient documentation

## 2018-02-10 DIAGNOSIS — C50919 Malignant neoplasm of unspecified site of unspecified female breast: Secondary | ICD-10-CM

## 2018-02-10 DIAGNOSIS — C50412 Malignant neoplasm of upper-outer quadrant of left female breast: Secondary | ICD-10-CM

## 2018-02-10 LAB — CBC WITH DIFFERENTIAL/PLATELET
BASOS ABS: 0 10*3/uL (ref 0.0–0.1)
Basophils Relative: 1 %
EOS ABS: 0.2 10*3/uL (ref 0.0–0.7)
Eosinophils Relative: 4 %
HCT: 31.9 % — ABNORMAL LOW (ref 36.0–46.0)
HEMOGLOBIN: 10.4 g/dL — AB (ref 12.0–15.0)
LYMPHS PCT: 32 %
Lymphs Abs: 1.5 10*3/uL (ref 0.7–4.0)
MCH: 30.5 pg (ref 26.0–34.0)
MCHC: 32.6 g/dL (ref 30.0–36.0)
MCV: 93.5 fL (ref 78.0–100.0)
Monocytes Absolute: 0.4 10*3/uL (ref 0.1–1.0)
Monocytes Relative: 9 %
Neutro Abs: 2.6 10*3/uL (ref 1.7–7.7)
Neutrophils Relative %: 54 %
Platelets: 190 10*3/uL (ref 150–400)
RBC: 3.41 MIL/uL — AB (ref 3.87–5.11)
RDW: 16 % — ABNORMAL HIGH (ref 11.5–15.5)
WBC: 4.7 10*3/uL (ref 4.0–10.5)

## 2018-02-10 LAB — BASIC METABOLIC PANEL
ANION GAP: 11 (ref 5–15)
BUN: 9 mg/dL (ref 6–20)
CALCIUM: 8.1 mg/dL — AB (ref 8.9–10.3)
CO2: 22 mmol/L (ref 22–32)
Chloride: 104 mmol/L (ref 101–111)
Creatinine, Ser: 0.82 mg/dL (ref 0.44–1.00)
GFR calc Af Amer: >60 mL/min (ref >60)
Glucose, Bld: 145 mg/dL — ABNORMAL HIGH (ref 65–99)
Potassium: 3.3 mmol/L — ABNORMAL LOW (ref 3.5–5.1)
Sodium: 137 mmol/L (ref 135–145)

## 2018-02-10 LAB — LACTATE DEHYDROGENASE: LDH: 201 U/L — ABNORMAL HIGH (ref 98–192)

## 2018-02-10 MED ORDER — DIPHENHYDRAMINE HCL 50 MG/ML IJ SOLN
50.0000 mg | Freq: Once | INTRAMUSCULAR | Status: AC
  Administered 2018-02-10: 50 mg via INTRAVENOUS
  Filled 2018-02-10: qty 1

## 2018-02-10 MED ORDER — SODIUM CHLORIDE 0.9 % IV SOLN
64.0000 mg/m2 | Freq: Once | INTRAVENOUS | Status: AC
  Administered 2018-02-10: 156 mg via INTRAVENOUS
  Filled 2018-02-10: qty 26

## 2018-02-10 MED ORDER — HEPARIN SOD (PORK) LOCK FLUSH 100 UNIT/ML IV SOLN
500.0000 [IU] | Freq: Once | INTRAVENOUS | Status: AC | PRN
  Administered 2018-02-10: 500 [IU]

## 2018-02-10 MED ORDER — SODIUM CHLORIDE 0.9% FLUSH
10.0000 mL | INTRAVENOUS | Status: DC | PRN
  Administered 2018-02-10: 10 mL
  Filled 2018-02-10: qty 10

## 2018-02-10 MED ORDER — FAMOTIDINE IN NACL 20-0.9 MG/50ML-% IV SOLN
20.0000 mg | Freq: Once | INTRAVENOUS | Status: AC
  Administered 2018-02-10: 20 mg via INTRAVENOUS
  Filled 2018-02-10: qty 50

## 2018-02-10 MED ORDER — SODIUM CHLORIDE 0.9 % IV SOLN
Freq: Once | INTRAVENOUS | Status: AC
  Administered 2018-02-10: 12:00:00 via INTRAVENOUS

## 2018-02-10 MED ORDER — SODIUM CHLORIDE 0.9 % IV SOLN
20.0000 mg | Freq: Once | INTRAVENOUS | Status: AC
  Administered 2018-02-10: 20 mg via INTRAVENOUS
  Filled 2018-02-10: qty 2

## 2018-02-10 NOTE — Patient Instructions (Signed)
[facility] Discharge Instructions for Patients Receiving Chemotherapy   Beginning January 23rd 2017 lab work for the Cancer Center will be done in the  Main lab at [facility] on 1st floor. If you have a lab appointment with the Cancer Center please come in thru the  Main Entrance and check in at the main information desk   Today you received the following chemotherapy agents   To help prevent nausea and vomiting after your treatment, we encourage you to take your nausea medication     If you develop nausea and vomiting, or diarrhea that is not controlled by your medication, call the clinic.  The clinic phone number is [phone]. Office hours are Monday-Friday 8:30am-5:00pm.  BELOW ARE SYMPTOMS THAT SHOULD BE REPORTED IMMEDIATELY:  *FEVER GREATER THAN 101.0 F  *CHILLS WITH OR WITHOUT FEVER  NAUSEA AND VOMITING THAT IS NOT CONTROLLED WITH YOUR NAUSEA MEDICATION  *UNUSUAL SHORTNESS OF BREATH  *UNUSUAL BRUISING OR BLEEDING  TENDERNESS IN MOUTH AND THROAT WITH OR WITHOUT PRESENCE OF ULCERS  *URINARY PROBLEMS  *BOWEL PROBLEMS  UNUSUAL RASH Items with * indicate a potential emergency and should be followed up as soon as possible. If you have an emergency after office hours please contact your primary care physician or go to the nearest emergency department.  Please call the clinic during office hours if you have any questions or concerns.   You may also contact the Patient Navigator at [phone] should you have any questions or need assistance in obtaining follow up care.      Resources For Cancer Patients and their Caregivers ? American Cancer Society: Can assist with transportation, wigs, general needs, runs Look Good Feel Better.        1-888-227-6333 ? Cancer Care: Provides financial assistance, online support groups, medication/co-pay assistance.  1-800-813-HOPE (4673) ? [facility] Assists [place] cancer  patients and their families through emotional , educational and financial support.  336-427-4357 ? [place] DSS Where to apply for food stamps, Medicaid and utility assistance. 336-342-1394 ? RC[facility]: Transportation to medical appointments. 336-347-2287 ? Social Security Administration: May apply for disability if have a Stage IV cancer. 336-342-7796 1-800-772-1213 ? [place] Aging, Disability and Transit Services: Assists with nutrition, care and transit needs. 336-349-2343         

## 2018-02-10 NOTE — Progress Notes (Signed)
Nutrition Brief Note  Received call from [facility] RN. Patient is requesting a second case of Ensure despite no weight loss or reported trouble eating. .   Wt Readings from Last 15 Encounters:  02/10/18 276 lb 3.2 oz (125.3 kg)  02/03/18 278 lb 3.2 oz (126.2 kg)  02/03/18 278 lb 3.2 oz (126.2 kg)  01/27/18 275 lb 6.4 oz (124.9 kg)  01/20/18 274 lb 3.2 oz (124.4 kg)  01/13/18 273 lb 11.2 oz (124.1 kg)  01/06/18 278 lb (126.1 kg)  12/23/17 273 lb (123.8 kg)  12/09/17 277 lb 9.6 oz (125.9 kg)  12/04/17 277 lb (125.6 kg)  11/21/17 276 lb 12.8 oz (125.6 kg)  11/05/17 274 lb 3.2 oz (124.4 kg)  10/27/17 272 lb 8 oz (123.6 kg)  10/20/17 275 lb 4.8 oz (124.9 kg)  10/09/17 272 lb 8 oz (123.6 kg)   Asked RN to review that each cancer pt is only eligible for 3 cases of Ensure. Patient is receiving her second case today and is only eligible for 1 more.   Burtis Junes RD, LDN, CNSC Clinical Nutrition Pager: [phone] 02/10/2018 1:54 PM

## 2018-02-10 NOTE — Progress Notes (Signed)
Labs reviewed with MD proceed with treatment.  Treatment given per orders. Patient tolerated it well without problems. Vitals stable and discharged home from clinic ambulatory. Follow up as scheduled.

## 2018-02-16 ENCOUNTER — Other Ambulatory Visit (HOSPITAL_COMMUNITY)
Admission: RE | Admit: 2018-02-16 | Discharge: 2018-02-16 | Disposition: A | Discharge status: Home / Self Care | Payer: Medicare Other | Source: Ambulatory Visit | Attending: Family Medicine | Admitting: Family Medicine

## 2018-02-16 ENCOUNTER — Other Ambulatory Visit (HOSPITAL_COMMUNITY): Payer: Self-pay | Admitting: Family Medicine

## 2018-02-16 ENCOUNTER — Ambulatory Visit (HOSPITAL_COMMUNITY)
Admission: RE | Admit: 2018-02-16 | Discharge: 2018-02-16 | Disposition: A | Discharge status: Home / Self Care | Payer: Medicare Other | Source: Ambulatory Visit | Attending: Family Medicine | Admitting: Family Medicine

## 2018-02-16 DIAGNOSIS — M25551 Pain in right hip: Secondary | ICD-10-CM | POA: Insufficient documentation

## 2018-02-16 DIAGNOSIS — M1611 Unilateral primary osteoarthritis, right hip: Secondary | ICD-10-CM | POA: Diagnosis not present

## 2018-02-16 LAB — CBC
HEMATOCRIT: 33.9 % — AB (ref 36.0–46.0)
HEMOGLOBIN: 11 g/dL — AB (ref 12.0–15.0)
MCH: 30.3 pg (ref 26.0–34.0)
MCHC: 32.4 g/dL (ref 30.0–36.0)
MCV: 93.4 fL (ref 78.0–100.0)
Platelets: 275 10*3/uL (ref 150–400)
RBC: 3.63 MIL/uL — AB (ref 3.87–5.11)
RDW: 15.8 % — ABNORMAL HIGH (ref 11.5–15.5)
WBC: 5.5 10*3/uL (ref 4.0–10.5)

## 2018-02-16 LAB — BASIC METABOLIC PANEL
ANION GAP: 14 (ref 5–15)
BUN: 7 mg/dL (ref 6–20)
CHLORIDE: 105 mmol/L (ref 101–111)
CO2: 21 mmol/L — AB (ref 22–32)
CREATININE: 0.76 mg/dL (ref 0.44–1.00)
Calcium: 8.6 mg/dL — ABNORMAL LOW (ref 8.9–10.3)
GFR calc Af Amer: >60 mL/min (ref >60)
GFR calc non Af Amer: >60 mL/min (ref >60)
Glucose, Bld: 142 mg/dL — ABNORMAL HIGH (ref 65–99)
POTASSIUM: 3.2 mmol/L — AB (ref 3.5–5.1)
Sodium: 140 mmol/L (ref 135–145)

## 2018-02-16 LAB — TSH: TSH: 1.577 u[IU]/mL (ref 0.350–4.500)

## 2018-02-16 LAB — LIPID PANEL
CHOL/HDL RATIO: 3.3 ratio
Cholesterol: 166 mg/dL (ref 0–200)
HDL: 50 mg/dL (ref >40)
LDL CALC: 77 mg/dL (ref 0–99)
Triglycerides: 196 mg/dL — ABNORMAL HIGH (ref <150)
VLDL: 39 mg/dL (ref 0–40)

## 2018-02-16 LAB — HEMOGLOBIN A1C
HEMOGLOBIN A1C: 7.2 % — AB (ref 4.8–5.6)
Mean Plasma Glucose: 159.94 mg/dL

## 2018-02-16 LAB — MAGNESIUM: Magnesium: 1.2 mg/dL — ABNORMAL LOW (ref 1.7–2.4)

## 2018-02-16 LAB — T4, FREE: Free T4: 0.93 ng/dL (ref 0.61–1.12)

## 2018-02-17 ENCOUNTER — Encounter: Payer: Self-pay | Admitting: General Practice

## 2018-02-17 ENCOUNTER — Inpatient Hospital Stay (HOSPITAL_COMMUNITY): Payer: Medicare Other

## 2018-02-17 ENCOUNTER — Inpatient Hospital Stay (HOSPITAL_BASED_OUTPATIENT_CLINIC_OR_DEPARTMENT_OTHER): Payer: Medicare Other | Admitting: Internal Medicine

## 2018-02-17 ENCOUNTER — Other Ambulatory Visit: Payer: Self-pay

## 2018-02-17 ENCOUNTER — Encounter (HOSPITAL_COMMUNITY): Payer: Self-pay | Admitting: Internal Medicine

## 2018-02-17 VITALS — BP 130/77 | HR 98 | Temp 98.5°F | Resp 18

## 2018-02-17 VITALS — BP 144/97 | HR 114 | Temp 97.8°F | Wt 276.4 lb

## 2018-02-17 DIAGNOSIS — C50412 Malignant neoplasm of upper-outer quadrant of left female breast: Secondary | ICD-10-CM

## 2018-02-17 DIAGNOSIS — Z171 Estrogen receptor negative status [ER-]: Secondary | ICD-10-CM | POA: Diagnosis not present

## 2018-02-17 DIAGNOSIS — K589 Irritable bowel syndrome without diarrhea: Secondary | ICD-10-CM

## 2018-02-17 DIAGNOSIS — M797 Fibromyalgia: Secondary | ICD-10-CM | POA: Diagnosis not present

## 2018-02-17 DIAGNOSIS — Z5111 Encounter for antineoplastic chemotherapy: Secondary | ICD-10-CM | POA: Diagnosis not present

## 2018-02-17 DIAGNOSIS — G620 Drug-induced polyneuropathy: Secondary | ICD-10-CM

## 2018-02-17 DIAGNOSIS — E876 Hypokalemia: Secondary | ICD-10-CM

## 2018-02-17 DIAGNOSIS — C50919 Malignant neoplasm of unspecified site of unspecified female breast: Secondary | ICD-10-CM

## 2018-02-17 LAB — CBC WITH DIFFERENTIAL/PLATELET
BASOS ABS: 0.1 10*3/uL (ref 0.0–0.1)
Basophils Relative: 1 %
Eosinophils Absolute: 0.1 10*3/uL (ref 0.0–0.7)
Eosinophils Relative: 2 %
HCT: 32 % — ABNORMAL LOW (ref 36.0–46.0)
Hemoglobin: 10.5 g/dL — ABNORMAL LOW (ref 12.0–15.0)
LYMPHS PCT: 27 %
Lymphs Abs: 1.6 10*3/uL (ref 0.7–4.0)
MCH: 30.5 pg (ref 26.0–34.0)
MCHC: 32.8 g/dL (ref 30.0–36.0)
MCV: 93 fL (ref 78.0–100.0)
MONO ABS: 0.5 10*3/uL (ref 0.1–1.0)
Monocytes Relative: 8 %
NEUTROS ABS: 3.8 10*3/uL (ref 1.7–7.7)
NEUTROS PCT: 62 %
PLATELETS: 241 10*3/uL (ref 150–400)
RBC: 3.44 MIL/uL — ABNORMAL LOW (ref 3.87–5.11)
RDW: 15.8 % — ABNORMAL HIGH (ref 11.5–15.5)
WBC: 6.1 10*3/uL (ref 4.0–10.5)

## 2018-02-17 LAB — COMPREHENSIVE METABOLIC PANEL
ALT: 24 U/L (ref 14–54)
ANION GAP: 11 (ref 5–15)
AST: 26 U/L (ref 15–41)
Albumin: 3.3 g/dL — ABNORMAL LOW (ref 3.5–5.0)
Alkaline Phosphatase: 67 U/L (ref 38–126)
BILIRUBIN TOTAL: 0.5 mg/dL (ref 0.3–1.2)
BUN: 10 mg/dL (ref 6–20)
CALCIUM: 8.5 mg/dL — AB (ref 8.9–10.3)
CO2: 23 mmol/L (ref 22–32)
Chloride: 104 mmol/L (ref 101–111)
Creatinine, Ser: 0.74 mg/dL (ref 0.44–1.00)
GFR calc non Af Amer: >60 mL/min (ref >60)
Glucose, Bld: 143 mg/dL — ABNORMAL HIGH (ref 65–99)
Potassium: 3 mmol/L — ABNORMAL LOW (ref 3.5–5.1)
Sodium: 138 mmol/L (ref 135–145)
TOTAL PROTEIN: 6 g/dL — AB (ref 6.5–8.1)

## 2018-02-17 LAB — LACTATE DEHYDROGENASE: LDH: 161 U/L (ref 98–192)

## 2018-02-17 LAB — VITAMIN D 25 HYDROXY (VIT D DEFICIENCY, FRACTURES): VIT D 25 HYDROXY: 41.3 ng/mL (ref 30.0–100.0)

## 2018-02-17 MED ORDER — HEPARIN SOD (PORK) LOCK FLUSH 100 UNIT/ML IV SOLN
500.0000 [IU] | Freq: Once | INTRAVENOUS | Status: AC | PRN
  Administered 2018-02-17: 500 [IU]

## 2018-02-17 MED ORDER — DEXAMETHASONE SODIUM PHOSPHATE 100 MG/10ML IJ SOLN
20.0000 mg | Freq: Once | INTRAMUSCULAR | Status: AC
  Administered 2018-02-17: 20 mg via INTRAVENOUS
  Filled 2018-02-17: qty 2

## 2018-02-17 MED ORDER — SODIUM CHLORIDE 0.9 % IV SOLN
64.0000 mg/m2 | Freq: Once | INTRAVENOUS | Status: AC
  Administered 2018-02-17: 156 mg via INTRAVENOUS
  Filled 2018-02-17: qty 26

## 2018-02-17 MED ORDER — DIPHENHYDRAMINE HCL 50 MG/ML IJ SOLN
INTRAMUSCULAR | Status: AC
Start: 2018-02-17 — End: 2018-02-17
  Filled 2018-02-17: qty 1

## 2018-02-17 MED ORDER — POTASSIUM CHLORIDE 10 MEQ/100ML IV SOLN
10.0000 meq | Freq: Once | INTRAVENOUS | Status: AC
  Administered 2018-02-17: 10 meq via INTRAVENOUS
  Filled 2018-02-17: qty 100

## 2018-02-17 MED ORDER — DIPHENHYDRAMINE HCL 50 MG/ML IJ SOLN
50.0000 mg | Freq: Once | INTRAMUSCULAR | Status: AC
  Administered 2018-02-17: 50 mg via INTRAVENOUS

## 2018-02-17 MED ORDER — FAMOTIDINE IN NACL 20-0.9 MG/50ML-% IV SOLN
INTRAVENOUS | Status: AC
  Filled 2018-02-17: qty 50

## 2018-02-17 MED ORDER — SODIUM CHLORIDE 0.9 % IV SOLN
Freq: Once | INTRAVENOUS | Status: AC
  Administered 2018-02-17: 12:00:00 via INTRAVENOUS

## 2018-02-17 MED ORDER — FAMOTIDINE IN NACL 20-0.9 MG/50ML-% IV SOLN
20.0000 mg | Freq: Once | INTRAVENOUS | Status: AC
  Administered 2018-02-17: 20 mg via INTRAVENOUS

## 2018-02-17 NOTE — Progress Notes (Signed)
[facility] CC CSW Progress Notes  Referral received from [facility] CC RN, requested that CSW speak w patient as she has needs for lymphedema supplies and general financial needs.  CSW spoke w patient by phone - patient reports that she receives Social Security retirement income which is insufficient to meet her needs for transportation to treatment and copays for care.  Patient states she has received help w utilities from [facility] and is aware of their resources.  Given information on Chiropractor and [facility] in [employer]. Also discussed possible referral to [employer] if her PCP, Jaclynn Guarneri MD, is part of their network.  [facility] provides outpatient care management services which can assist w community resources and referrals. Patient referred to [facility] for possible help w transportation to appointments if needed.   [facility] will contact patient directly to schedule rides if needed.  Information packet mailed to patient, patient will bring applications and required documents to her next visit so CSW can assist her w this process.    Edwyna Shell, LCSW Clinical Social Worker Phone:  [phone]

## 2018-02-17 NOTE — Progress Notes (Signed)
Tolerated infusions w/o adverse reaction.  Alert, in no distress.  VSS.  Discharged ambulatory.  

## 2018-02-17 NOTE — Progress Notes (Signed)
Diagnosis Malignant neoplasm of upper-outer quadrant of left breast in female, estrogen receptor negative ([place]) - Plan: CBC with Differential/Platelet, Comprehensive metabolic panel, Lactate dehydrogenase, DISCONTINUED: 0.9 %  sodium chloride infusion, DISCONTINUED: heparin lock flush 100 unit/mL, DISCONTINUED: dexamethasone (DECADRON) 20 mg in sodium chloride 0.9 % 50 mL IVPB, DISCONTINUED: diphenhydrAMINE (BENADRYL) injection 50 mg, DISCONTINUED: famotidine (PEPCID) IVPB 20 mg premix, DISCONTINUED: PACLitaxel (TAXOL) 156 mg in dextrose 5 % 250 mL chemo infusion (</= 7m/m2)  Staging Cancer Staging Malignant neoplasm of upper-outer quadrant of left female breast (HCC) Staging form: Breast, AJCC 8th Edition - Clinical: cT1c, cN0 - Unsigned - Pathologic: Stage IB (pT1c, pN0, cM0, G3, ER: Negative, PR: Negative, HER2: Negative) - Signed by ZTwana First MD on 10/09/2017   Assessment and Plan:  1.  Stage 1 B triple negative left breast cancer.  Patient is status post left mastectomy.  She was seen initially by Dr. ZTalbert Cageand recommended for adjuvant therapy with Adriamycin and Cytoxan for 4 cycles followed by Taxol weekly for 12 weeks.  She has completed 4 cycles of AC.  She is here today for C7 of Taxol.  Labs are adequate for chemotherapy.  She will proceed with therapy and will RTC in 3 weeks for followup.   2.  Abdominal pain.  Pt has history of IBS.  She was previously referred to GI but did not go to that appointment.  Will reschedule her due to ongoing abdominal complaints.  KUB was done 12/2017 and showed no acute findings.  She has been started on Bentyl by PCP.    3.  Hypokalemia.  Will replace potassium today with IV KCL.  Repeat labs next week.   4.  Neuropathy.  Pt was seen by Dr. KWorthy Keelerand he reduced dose of Taxol by 20%.  Will continue to monitor as therapy proceeds.    5.  Lymphedema cost questions.  Will ask for case manager to discuss this with pt.    6.  Fibromyalgia.   Continue to follow-up with PCP.    DX:  Triple negative stage IB left breast cancer with high grade DCIS.  Interval history:  67y.o. postmenopausal female with triple negative high grade invasive ductal carcinoma of the left breast with high grade DCIS. Patient was noted to have a mass in the upper outer quadrant on screening mammogram.  Core biopsy was performed which revealed invasive carcinoma.  She underwent left modified radical mastectomy with lymph node dissection on 09/15/2017.  Path returned as invasive ductal carcinoma, grade 3, spanning 1.8 cm, high-grade ductal carcinoma in situ, resection margins were negative, 8 out of 8 lymph nodes were negative for carcinoma.  Patient states that she was adopted but knew her birth mother.  Her birth mother did not have breast cancer.  She states she had a half sister who had breast cancer in her 69s  Current Status:  Patient is seen today for evaluation prior to C7 of Taxol previously recommended under the direction of Dr. ZTalbert Cage  She is complaining of ongoing abdominal symptoms.  She did not see GI as recommended.    Problem List Patient Active Problem List   Diagnosis Date Noted  . Breast cancer, left breast ([place] [C50.912] 09/15/2017  . Malignant neoplasm of upper-outer quadrant of left female breast ([place] [C50.412]   . Hypercholesteremia [E78.00]   . Hypertension [I10]   . Anxiety [F41.9]   . Depression [F32.9]   . Fibromyalgia [M79.7]   . GERD (gastroesophageal reflux disease) [K21.9]   .  Byssinosis ([place]) [J66.0]     Past Medical History Past Medical History:  Diagnosis Date  . Anxiety   . Arthritis   . Back pain   . Byssinosis ([place])    secondary to working in a [employer]  . Chest pain   . Chronic pain   . Depression   . Diabetes mellitus without complication ([place])   . Fibromyalgia   . GERD (gastroesophageal reflux disease)   . Hypercholesteremia   . Hypertension   . Knee pain   . Obesity   . Peptic ulcer    history  of - normal GI studies Sept 2010    Past Surgical History Past Surgical History:  Procedure Laterality Date  . APPENDECTOMY    . bladder rectal repair    . BREAST SURGERY Left    breast biopsy- benign  . CHOLECYSTECTOMY    . DILATION AND CURETTAGE OF UTERUS    . GANGLION CYST EXCISION Left   . MASTECTOMY MODIFIED RADICAL Left 09/15/2017   Procedure: LEFT MODIFIED RADICAL MASTECTOMY;  Surgeon: Aviva Signs, MD;  Location: [facility];  Service: General;  Laterality: Left;  . PORTACATH PLACEMENT Right 10/13/2017   Procedure: INSERTION PORT-A-CATH RIGHT SUBCLAVIAN;  Surgeon: Aviva Signs, MD;  Location: [facility];  Service: General;  Laterality: Right;  . TOTAL ABDOMINAL HYSTERECTOMY      Family History Family History  Problem Relation Age of Onset  . Aneurysm Father      Social History  reports that she has never smoked. Her smokeless tobacco use includes snuff. She reports that she does not drink alcohol or use drugs.  Medications  Current Outpatient Medications:  .  albuterol (PROVENTIL) (2.5 MG/3ML) 0.083% nebulizer solution, Take 2.5 mg by nebulization every 6 (six) hours as needed for wheezing or shortness of breath. , Disp: , Rfl:  .  ALPRAZolam (XANAX) 1 MG tablet, Take 5 mg by mouth 3 (three) times daily as needed for anxiety. , Disp: , Rfl:  .  Camphor-Eucalyptus-Menthol (VICKS VAPORUB EX), Apply 1 application daily as needed topically (congestion)., Disp: , Rfl:  .  Cholecalciferol (VITAMIN D3) 5000 units CAPS, Take 5,000 Units by mouth daily., Disp: , Rfl:  .  dapagliflozin propanediol (FARXIGA) 5 MG TABS tablet, Take 5 mg by mouth daily. Only takes on days she takes steroids., Disp: , Rfl:  .  dicyclomine (BENTYL) 10 MG capsule, Take 10 mg by mouth 4 (four) times daily -  before meals and at bedtime., Disp: , Rfl:  .  Diphenhyd-Hydrocort-Nystatin (FIRST-DUKES MOUTHWASH) SUSP, Swish and swallow 5 mLs no more than every 4 hours., Disp: 240 mL, Rfl: 3 .  diphenoxylate-atropine  (LOMOTIL) 2.5-0.025 MG tablet, Take 1 tablet by mouth 4 (four) times daily as needed for diarrhea or loose stools., Disp: 30 tablet, Rfl: 0 .  gabapentin (NEURONTIN) 300 MG capsule, Take 1 capsule (300 mg total) by mouth 3 (three) times daily. Reported on 03/12/2016, Disp: 90 capsule, Rfl: 0 .  HYDROcodone-acetaminophen (NORCO/VICODIN) 5-325 MG tablet, Take 1 tablet by mouth every 4 (four) hours as needed. , Disp: , Rfl: 0 .  hydrOXYzine (ATARAX/VISTARIL) 50 MG tablet, Take 50 mg by mouth at bedtime. , Disp: , Rfl:  .  lidocaine (XYLOCAINE) 2 % solution, , Disp: , Rfl:  .  Lidocaine 0.5 % GEL, Apply 1 application topically as needed (for pain)., Disp: , Rfl:  .  lidocaine-prilocaine (EMLA) cream, Apply to affected area once, Disp: 30 g, Rfl: 3 .  magic mouthwash w/lidocaine  SOLN, TAKE ONE TEASPOONFUL (5ML) BY MOUTH SWISH AND SWALLOW NO MORE THAN EVERY FOUR HOURS., Disp: , Rfl: 3 .  magnesium 30 MG tablet, Take 30 mg by mouth 2 (two) times daily., Disp: , Rfl:  .  metFORMIN (GLUCOPHAGE) 500 MG tablet, Take 500 mg by mouth 2 (two) times daily with a meal. , Disp: , Rfl:  .  nitroGLYCERIN (NITROSTAT) 0.4 MG SL tablet, Place 0.4 mg under the tongue every 5 (five) minutes as needed for chest pain., Disp: , Rfl:  .  omeprazole (PRILOSEC) 40 MG capsule, Take 40 mg every other day by mouth. At night, Disp: , Rfl:  .  ondansetron (ZOFRAN) 8 MG tablet, Take 1 tablet (8 mg total) 2 (two) times daily as needed by mouth. Start on the third day after chemotherapy., Disp: 30 tablet, Rfl: 1 .  pravastatin (PRAVACHOL) 40 MG tablet, Take 40 mg by mouth daily., Disp: , Rfl:  .  prochlorperazine (COMPAZINE) 10 MG tablet, Take 1 tablet (10 mg total) every 6 (six) hours as needed by mouth (Nausea or vomiting)., Disp: 30 tablet, Rfl: 1 .  promethazine (PHENERGAN) 25 MG tablet, Take 25 mg by mouth every 6 (six) hours as needed for nausea or vomiting., Disp: , Rfl:  .  Pseudoeph-Doxylamine-DM-APAP (NYQUIL PO), Take 2 tablets  at bedtime as needed by mouth (sinus headaches)., Disp: , Rfl:  No current facility-administered medications for this visit.   Facility-Administered Medications Ordered in Other Visits:  .  heparin lock flush 100 unit/mL, 500 Units, Intracatheter, Once PRN, Chancey Ringel, Mathis Dad, MD .  PACLitaxel (TAXOL) 156 mg in sodium chloride 0.9 % 250 mL chemo infusion (</= 81m/m2), 64 mg/m2 (Treatment Plan Recorded), Intravenous, Once, Karel Turpen, MD, Last Rate: 276 mL/hr at 02/17/18 1300, 156 mg at 02/17/18 1300  Allergies Magnesium sulfate; Other; Iohexol; Ivp dye [iodinated diagnostic agents]; Januvia [sitagliptin]; Latex; Motrin [ibuprofen]; Nsaids; Statins; Sulfa antibiotics; and Adhesive [tape]  Review of Systems Review of Systems - Oncology ROS as per HPI otherwise 12 point ROS is negative other than neuropathy and abdominal pain.     Physical Exam  Vitals Wt Readings from Last 3 Encounters:  02/17/18 276 lb 6.4 oz (125.4 kg)  02/10/18 276 lb 3.2 oz (125.3 kg)  02/03/18 278 lb 3.2 oz (126.2 kg)   Temp Readings from Last 3 Encounters:  02/17/18 97.8 F (36.6 C) (Oral)  02/10/18 98 F (36.7 C)  02/03/18 98.3 F (36.8 C)   BP Readings from Last 3 Encounters:  02/17/18 (!) 144/97  02/10/18 129/71  02/03/18 137/89   Pulse Readings from Last 3 Encounters:  02/17/18 (!) 114  02/10/18 (!) 101  02/03/18 (!) 108   Constitutional: Well-developed, well-nourished, and in no distress.   HENT: Head: Normocephalic and atraumatic.  Mouth/Throat: No oropharyngeal exudate. Mucosa moist. Eyes: Pupils are equal, round, and reactive to light. Conjunctivae are normal. No scleral icterus.  Neck: Normal range of motion. Neck supple. No JVD present.  Cardiovascular: Normal rate, regular rhythm and normal heart sounds.  Exam reveals no gallop and no friction rub.   No murmur heard. Pulmonary/Chest: Effort normal and breath sounds normal. No respiratory distress. No wheezes.No rales.  Abdominal: Soft.  Diffuse tenderness to palpation.  There is no guarding.  Musculoskeletal: No edema or tenderness.  Lymphadenopathy: No cervical, axillary or supraclavicular adenopathy.  Neurological: Alert and oriented to person, place, and time. No cranial nerve deficit.  Skin: Skin is warm and dry. No rash noted. No erythema. No pallor.  Psychiatric: Affect and judgment normal.  Bilateral breast exam:  Chaperone present.  Left mastectomy healed well.  Pt reports some chest wall tenderness.  Right breast shows no dominant masses.    Labs Infusion on 02/17/2018  Component Date Value Ref Range Status  . Sodium 02/17/2018 138  135 - 145 mmol/L Final  . Potassium 02/17/2018 3.0* 3.5 - 5.1 mmol/L Final  . Chloride 02/17/2018 104  101 - 111 mmol/L Final  . CO2 02/17/2018 23  22 - 32 mmol/L Final  . Glucose, Bld 02/17/2018 143* 65 - 99 mg/dL Final  . BUN 02/17/2018 10  6 - 20 mg/dL Final  . Creatinine, Ser 02/17/2018 0.74  0.44 - 1.00 mg/dL Final  . Calcium 02/17/2018 8.5* 8.9 - 10.3 mg/dL Final  . Total Protein 02/17/2018 6.0* 6.5 - 8.1 g/dL Final  . Albumin 02/17/2018 3.3* 3.5 - 5.0 g/dL Final  . AST 02/17/2018 26  15 - 41 U/L Final  . ALT 02/17/2018 24  14 - 54 U/L Final  . Alkaline Phosphatase 02/17/2018 67  38 - 126 U/L Final  . Total Bilirubin 02/17/2018 0.5  0.3 - 1.2 mg/dL Final  . GFR calc non Af Amer 02/17/2018 >60  >60 mL/min Final  . GFR calc Af Amer 02/17/2018 >60  >60 mL/min Final   Comment: (NOTE) The eGFR has been calculated using the CKD EPI equation. This calculation has not been validated in all clinical situations. eGFR's persistently <60 mL/min signify possible Chronic Kidney Disease.   Georgiann Hahn gap 02/17/2018 11  5 - 15 Final   Performed at [facility], [address], [place], [place] [address]  . WBC 02/17/2018 6.1  4.0 - 10.5 K/uL Final  . RBC 02/17/2018 3.44* 3.87 - 5.11 MIL/uL Final  . Hemoglobin 02/17/2018 10.5* 12.0 - 15.0 g/dL Final  . HCT 02/17/2018 32.0* 36.0 - 46.0 %  Final  . MCV 02/17/2018 93.0  78.0 - 100.0 fL Final  . MCH 02/17/2018 30.5  26.0 - 34.0 pg Final  . MCHC 02/17/2018 32.8  30.0 - 36.0 g/dL Final  . RDW 02/17/2018 15.8* 11.5 - 15.5 % Final  . Platelets 02/17/2018 241  150 - 400 K/uL Final  . Neutrophils Relative % 02/17/2018 62  % Final  . Lymphocytes Relative 02/17/2018 27  % Final  . Monocytes Relative 02/17/2018 8  % Final  . Eosinophils Relative 02/17/2018 2  % Final  . Basophils Relative 02/17/2018 1  % Final  . Neutro Abs 02/17/2018 3.8  1.7 - 7.7 K/uL Final  . Lymphs Abs 02/17/2018 1.6  0.7 - 4.0 K/uL Final  . Monocytes Absolute 02/17/2018 0.5  0.1 - 1.0 K/uL Final  . Eosinophils Absolute 02/17/2018 0.1  0.0 - 0.7 K/uL Final  . Basophils Absolute 02/17/2018 0.1  0.0 - 0.1 K/uL Final  . Smear Review 02/17/2018 MORPHOLOGY UNREMARKABLE   Final   Performed at [facility], [address], [place], [place] [address]  . LDH 02/17/2018 161  98 - 192 U/L Final   Performed at [facility], [address], [place], [place] [address]  Hospital Outpatient Visit on 02/16/2018  Component Date Value Ref Range Status  . Cholesterol 02/16/2018 166  0 - 200 mg/dL Final  . Triglycerides 02/16/2018 196* <150 mg/dL Final  . HDL 02/16/2018 50  >40 mg/dL Final  . Total CHOL/HDL Ratio 02/16/2018 3.3  RATIO Final  . VLDL 02/16/2018 39  0 - 40 mg/dL Final  . LDL Cholesterol 02/16/2018 77  0 - 99 mg/dL Final   Comment:        Total Cholesterol/HDL:CHD Risk Coronary Heart Disease Risk Table                     Men   Women  1/2 Average Risk   3.4   3.3  Average Risk       5.0   4.4  2 X Average Risk   9.6   7.1  3 X Average Risk  23.4   11.0        Use the calculated Patient Ratio above and the CHD Risk Table to determine the patient's CHD Risk.        ATP III CLASSIFICATION (LDL):  <100     mg/dL   Optimal  100-129  mg/dL   Near or Above                    Optimal  130-159  mg/dL   Borderline  160-189  mg/dL   High  >190     mg/dL   Very  High Performed at [facility], [address] [place], [place] [address]   . Hgb A1c MFr Bld 02/16/2018 7.2* 4.8 - 5.6 % Final   Comment: (NOTE) Pre diabetes:          5.7%-6.4% Diabetes:              >6.4% Glycemic control for   <7.0% adults with diabetes   . Mean Plasma Glucose 02/16/2018 159.94  mg/dL Final   Performed at [facility] [address] [place], [place] [address]  . TSH 02/16/2018 1.577  0.350 - 4.500 uIU/mL Final   Comment: Performed by a 3rd Generation assay with a functional sensitivity of <=0.01 uIU/mL. Performed at [facility], [address] [place], [place] [address]   . Free T4 02/16/2018 0.93  0.61 - 1.12 ng/dL Final   Comment: (NOTE) Biotin ingestion may interfere with free T4 tests. If the results are inconsistent with the TSH level, previous test results, or the clinical presentation, then consider biotin interference. If needed, order repeat testing after stopping biotin. Performed at [facility], [place] [address] [place], [place] [address]   . Vit D, 25-Hydroxy 02/16/2018 41.3  30.0 - 100.0 ng/mL Final   Comment: (NOTE) Vitamin D deficiency has been defined by the [place] practice guideline as a level of serum 25-OH vitamin D less than 20 ng/mL (1,2). The Endocrine Society went on to further define vitamin D insufficiency as a level between 21 and 29 ng/mL (2). 1. IOM (Institute of Medicine). 2010. Dietary reference   intakes for calcium and D. [place]: The   [employer]. 2. Holick MF, [place], Bischoff-Ferrari HA, et al.   Evaluation, treatment, and prevention of vitamin D   deficiency: an Endocrine Society clinical practice   guideline. JCEM. 2011 Jul; 96(7):1911-30. Performed At: [facility] [place], [place] [id number] Rush Farmer MD NI:[id number] Performed at [facility], [address], [place], [place] [address]   . Sodium 02/16/2018  140  135 - 145 mmol/L Final  . Potassium 02/16/2018 3.2* 3.5 - 5.1 mmol/L Final  . Chloride 02/16/2018 105  101 - 111 mmol/L Final  . CO2 02/16/2018 21* 22 - 32 mmol/L Final  . Glucose, Bld 02/16/2018 142* 65 - 99 mg/dL Final  . BUN 02/16/2018 7  6 - 20  mg/dL Final  . Creatinine, Ser 02/16/2018 0.76  0.44 - 1.00 mg/dL Final  . Calcium 02/16/2018 8.6* 8.9 - 10.3 mg/dL Final  . GFR calc non Af Amer 02/16/2018 >60  >60 mL/min Final  . GFR calc Af Amer 02/16/2018 >60  >60 mL/min Final   Comment: (NOTE) The eGFR has been calculated using the CKD EPI equation. This calculation has not been validated in all clinical situations. eGFR's persistently <60 mL/min signify possible Chronic Kidney Disease.   Georgiann Hahn gap 02/16/2018 14  5 - 15 Final   Performed at [facility], [address], [place], [place] [address]  . WBC 02/16/2018 5.5  4.0 - 10.5 K/uL Final  . RBC 02/16/2018 3.63* 3.87 - 5.11 MIL/uL Final  . Hemoglobin 02/16/2018 11.0* 12.0 - 15.0 g/dL Final  . HCT 02/16/2018 33.9* 36.0 - 46.0 % Final  . MCV 02/16/2018 93.4  78.0 - 100.0 fL Final  . MCH 02/16/2018 30.3  26.0 - 34.0 pg Final  . MCHC 02/16/2018 32.4  30.0 - 36.0 g/dL Final  . RDW 02/16/2018 15.8* 11.5 - 15.5 % Final  . Platelets 02/16/2018 275  150 - 400 K/uL Final   Performed at [facility], [address], [place], [place] [address]  . Magnesium 02/16/2018 1.2* 1.7 - 2.4 mg/dL Final   Performed at [facility], [address] [place], [place] [address]     Pathology Orders Placed This Encounter  Procedures  . CBC with Differential/Platelet    Standing Status:   Future    Standing Expiration Date:   02/18/2019  . Comprehensive metabolic panel    Standing Status:   Future    Standing Expiration Date:   02/18/2019  . Lactate dehydrogenase    Standing Status:   Future    Standing Expiration Date:   02/18/2019       Zoila Shutter MD

## 2018-02-17 NOTE — Patient Instructions (Addendum)
[place] at [facility] Discharge Instructions  You were seen today by Dr. Walden Field. She went over your recent labs . She discussed with you how you've been feeling and your abdominal pain. She will call and discuss things with your GI doctor. Continue your current treatment. We will see you back in 3 weeks for follow up.    Thank you for choosing [place] at [facility] to provide your oncology and hematology care.  To afford each patient quality time with our provider, please arrive at least 15 minutes before your scheduled appointment time.   If you have a lab appointment with the [facility] please come in thru the  Main Entrance and check in at the main information desk  You need to re-schedule your appointment should you arrive 10 or more minutes late.  We strive to give you quality time with our providers, and arriving late affects you and other patients whose appointments are after yours.  Also, if you no show three or more times for appointments you may be dismissed from the clinic at the providers discretion.     Again, thank you for choosing [facility].  Our hope is that these requests will decrease the amount of time that you wait before being seen by our physicians.       _____________________________________________________________  Should you have questions after your visit to [facility], please contact our office at [phone] between the hours of 8:30 a.m. and 4:30 p.m.  Voicemails left after 4:30 p.m. will not be returned until the following business day.  For prescription refill requests, have your pharmacy contact our office.       Resources For Cancer Patients and their Caregivers ? American Cancer Society: Can assist with transportation, wigs, general needs, runs Look Good Feel Better.        678-680-0714 ? Cancer Care: Provides financial assistance, online support groups,  medication/co-pay assistance.  1-800-813-HOPE (515)582-6685) ? [facility] Assists [place] cancer patients and their families through emotional , educational and financial support.  218-292-0919 ? [place] DSS Where to apply for food stamps, Medicaid and utility assistance. (618) 600-7167 ? [facility]: Transportation to medical appointments. (614)314-8094 ? Social Security Administration: May apply for disability if have a Stage IV cancer. 647-500-6180 626-464-6934 ? [employer], Disability and Transit Services: Assists with nutrition, care and transit needs. [place] Support Programs:   > Cancer Support Group  2nd Tuesday of the month 1pm-2pm, Journey Room   > Creative Journey  3rd Tuesday of the month 1130am-1pm, Journey Room

## 2018-02-20 MED ORDER — UNABLE TO FIND: 0 refills | Status: DC

## 2018-02-20 NOTE — Addendum Note (Signed)
Addended by: Farley Ly on: 02/20/2018 03:39 PM   Modules accepted: Orders

## 2018-02-23 ENCOUNTER — Other Ambulatory Visit: Payer: Self-pay | Admitting: *Deleted

## 2018-02-23 NOTE — Patient Outreach (Signed)
[place] [facility]) Care Management  02/23/2018  Kelli Wilson 13-Aug-1951 [id number]   Referral received: 02/20/18 Referral source; [facility], Dr.Higgs Referral reason : Multiple psychosocial issues , financial concerns, transportation concerns, access to lymphedema treatment,  Insurance: Horticulturist, commercial outreach call to patient explained purpose of call, HIPAA information verified.  Patient discussed not feeling well over the last week after her chemotherapy  due to having diarrhea,she reports no further stools today after she has taken lomotil.  Social Patient discussed that she lives at home alone, usually able to complete self ADL's, has handicap accessible bathroom. Patient has a daughter that lives out of state. Patient has a cane available for as needed use, usually only occasionally . Patient reports that she is able to drive to local appointment to PCP and chemo treatment if she has the gas money. She discussed the social at cancer center has referred to her program for transportation that she has to call 3 days in advance through [place] patient has contact number. Patient discussed financial concerns, she gets some amount of food stamps, and goes to local food banks. Patient discussed that times she runs out of money to pay for gas and food supplies.  Patient discussed her concern being cost of copayment for Lymphedema treatment, she cannot afford that, states she would need to go 2 to 3 times a week. Patient discussed going to evaluation appointment but did not receive treatment due to cost. Patient also expressed treatment was in [place] but she wanted it to be in [place] if possible, she also stated the  MD discussed that it might be a program to help with paying.    Conditions  Patient discussed medical condition of left breast cancer, mastectomy, lymphedema and presently going through chemotherapy. Patient discussed  problems with diarrhea,and taking prn lomotil with some relief and nausea with relief with after phenergan prn.  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  Patient discussed breathing condition that she has related to working in [employer] for years  but it has been controlled for years she has a nebulizer if needed. Reports she has been instructed if she used her nebulizer 3 times to go to emergency room, discussed COPD/Pulmonary lung condition zone information patient was not familiar with.   Medications ; Patient denies any medication cost concerns she gets extra help with prescriptions, patient manages her own daily medications and denies any difficulty , she does not use a pill organizer. Patient discussed recently being prescribed Gabapention for numbness in her feet and hands  states it did not help and she stopped taking and notified PCP.   Appointments Patient attending scheduled chemo visit, has upcoming GI appointment for follow up regarding diarrhea.   Advanced Directive Patient has a living will  Consent Explained and offered [facility] services, patient in agreement to services. Patient will benefit from nursing care management. She has history of Diabetes, Alc 5.9 and reports it is under control, he main concerns are related to getting treatment for lymphedema due to recent left breast mastectomy, and current problem with diarrhea.  Patient is agreeable to care coordinator telephonic  involvement regarding  care coordination of being able to receive Lymphedema care, discussed with patient [facility] does not assist with copayments.   Patient will benefit from LCSW consult for  financial concerns, cost saving measures, eligibility for meals on wheels.   Plan RN Will provide telephonic care management, care coordination related to access to Lymphedema care and therapy and plan follow up call in the next week for initial assessment . RN will place LCSW consult for financial concerns, eligibility of meals on wheels and other community resources  Provided patient with [facility] contact number, Will  Send [facility]  welcome packet with consent . Will send PCP involvement letter.     Joylene Draft, RN, [place] Management Coordinator  [phone]- Mobile [phone]- Toll Free Main Office

## 2018-02-24 ENCOUNTER — Telehealth (HOSPITAL_COMMUNITY): Payer: Self-pay | Admitting: Internal Medicine

## 2018-02-24 ENCOUNTER — Encounter (HOSPITAL_COMMUNITY): Payer: Self-pay

## 2018-02-24 ENCOUNTER — Encounter: Payer: Self-pay | Admitting: *Deleted

## 2018-02-24 ENCOUNTER — Inpatient Hospital Stay (HOSPITAL_COMMUNITY): Payer: Medicare Other | Attending: Hematology and Oncology

## 2018-02-24 ENCOUNTER — Other Ambulatory Visit: Payer: Self-pay

## 2018-02-24 ENCOUNTER — Encounter: Payer: Self-pay | Admitting: General Practice

## 2018-02-24 ENCOUNTER — Other Ambulatory Visit: Payer: Self-pay | Admitting: *Deleted

## 2018-02-24 VITALS — BP 153/86 | HR 96 | Temp 98.4°F | Resp 22 | Wt 272.2 lb

## 2018-02-24 DIAGNOSIS — K580 Irritable bowel syndrome with diarrhea: Secondary | ICD-10-CM | POA: Diagnosis not present

## 2018-02-24 DIAGNOSIS — R112 Nausea with vomiting, unspecified: Secondary | ICD-10-CM | POA: Diagnosis not present

## 2018-02-24 DIAGNOSIS — Z5111 Encounter for antineoplastic chemotherapy: Secondary | ICD-10-CM | POA: Insufficient documentation

## 2018-02-24 DIAGNOSIS — C50412 Malignant neoplasm of upper-outer quadrant of left female breast: Secondary | ICD-10-CM | POA: Insufficient documentation

## 2018-02-24 DIAGNOSIS — Z171 Estrogen receptor negative status [ER-]: Secondary | ICD-10-CM

## 2018-02-24 LAB — CBC WITH DIFFERENTIAL/PLATELET
BASOS ABS: 0 10*3/uL (ref 0.0–0.1)
BASOS PCT: 1 %
EOS ABS: 0.2 10*3/uL (ref 0.0–0.7)
EOS PCT: 3 %
HCT: 33.4 % — ABNORMAL LOW (ref 36.0–46.0)
Hemoglobin: 11 g/dL — ABNORMAL LOW (ref 12.0–15.0)
Lymphocytes Relative: 31 %
Lymphs Abs: 1.8 10*3/uL (ref 0.7–4.0)
MCH: 30.7 pg (ref 26.0–34.0)
MCHC: 32.9 g/dL (ref 30.0–36.0)
MCV: 93.3 fL (ref 78.0–100.0)
MONOS PCT: 10 %
Monocytes Absolute: 0.6 10*3/uL (ref 0.1–1.0)
Neutro Abs: 3.4 10*3/uL (ref 1.7–7.7)
Neutrophils Relative %: 55 %
PLATELETS: 281 10*3/uL (ref 150–400)
RBC: 3.58 MIL/uL — ABNORMAL LOW (ref 3.87–5.11)
RDW: 15.4 % (ref 11.5–15.5)
WBC: 6 10*3/uL (ref 4.0–10.5)

## 2018-02-24 LAB — COMPREHENSIVE METABOLIC PANEL
ALK PHOS: 64 U/L (ref 38–126)
ALT: 27 U/L (ref 14–54)
ANION GAP: 13 (ref 5–15)
AST: 33 U/L (ref 15–41)
Albumin: 3.5 g/dL (ref 3.5–5.0)
BUN: 8 mg/dL (ref 6–20)
CALCIUM: 9 mg/dL (ref 8.9–10.3)
CO2: 23 mmol/L (ref 22–32)
Chloride: 102 mmol/L (ref 101–111)
Creatinine, Ser: 0.75 mg/dL (ref 0.44–1.00)
GFR calc non Af Amer: >60 mL/min (ref >60)
Glucose, Bld: 130 mg/dL — ABNORMAL HIGH (ref 65–99)
POTASSIUM: 3.2 mmol/L — AB (ref 3.5–5.1)
SODIUM: 138 mmol/L (ref 135–145)
TOTAL PROTEIN: 6.6 g/dL (ref 6.5–8.1)
Total Bilirubin: 0.5 mg/dL (ref 0.3–1.2)

## 2018-02-24 LAB — LACTATE DEHYDROGENASE: LDH: 159 U/L (ref 98–192)

## 2018-02-24 MED ORDER — SODIUM CHLORIDE 0.9 % IV SOLN
20.0000 mg | Freq: Once | INTRAVENOUS | Status: AC
  Administered 2018-02-24: 20 mg via INTRAVENOUS
  Filled 2018-02-24: qty 2

## 2018-02-24 MED ORDER — DIPHENHYDRAMINE HCL 50 MG/ML IJ SOLN
50.0000 mg | Freq: Once | INTRAMUSCULAR | Status: AC
  Administered 2018-02-24: 50 mg via INTRAVENOUS
  Filled 2018-02-24: qty 1

## 2018-02-24 MED ORDER — SODIUM CHLORIDE 0.9 % IV SOLN
64.0000 mg/m2 | Freq: Once | INTRAVENOUS | Status: AC
  Administered 2018-02-24: 156 mg via INTRAVENOUS
  Filled 2018-02-24: qty 26

## 2018-02-24 MED ORDER — HEPARIN SOD (PORK) LOCK FLUSH 100 UNIT/ML IV SOLN
500.0000 [IU] | Freq: Once | INTRAVENOUS | Status: AC | PRN
  Administered 2018-02-24: 500 [IU]
  Filled 2018-02-24: qty 5

## 2018-02-24 MED ORDER — POTASSIUM CHLORIDE 10 MEQ/100ML IV SOLN
10.0000 meq | Freq: Once | INTRAVENOUS | Status: AC
  Administered 2018-02-24: 10 meq via INTRAVENOUS
  Filled 2018-02-24: qty 100

## 2018-02-24 MED ORDER — SODIUM CHLORIDE 0.9 % IV SOLN
Freq: Once | INTRAVENOUS | Status: AC
  Administered 2018-02-24: 12:00:00 via INTRAVENOUS

## 2018-02-24 MED ORDER — FAMOTIDINE IN NACL 20-0.9 MG/50ML-% IV SOLN
20.0000 mg | Freq: Once | INTRAVENOUS | Status: AC
  Administered 2018-02-24: 20 mg via INTRAVENOUS
  Filled 2018-02-24: qty 50

## 2018-02-24 NOTE — Progress Notes (Signed)
[facility] CSW Progress Note  Applications for financial assistance submitted to [employer] [place] on patient behalf.  Asked scheduler (Amy N) to see if patient can be scheduled for lymphadema tx in [place] which is closer to her home in order to conserve gas funds.  [place] [facility] has been asked to assist w copay for this treatment, may not be able to help w all treatments needed.  [facility] CC assisted w gift cards and payment for lymphedema sleeve.  CSW will continue to monitor as patient continues in treatment at [facility].  Will be followed in community by [place] and Rn.  Edwyna Shell, LCSW Clinical Social Worker Phone:  [phone]

## 2018-02-24 NOTE — Telephone Encounter (Signed)
Gave pt $40 gift card from [place] and informed pt will will purchase her Lymphadema sleeve from [facility].

## 2018-02-24 NOTE — Progress Notes (Signed)
Tolerated infusions w/o adverse reaction.  Alert, in no distress.  VSS.  Discharged ambulatory.  

## 2018-02-24 NOTE — Patient Outreach (Signed)
[place] [facility]) Care Management  02/24/2018  ATINA FEELEY 14-Jun-1951 [id number]   Care Coordination call   Incoming call from Edwyna Shell < LCSW at oncology, to discuss concerns regarding patient being able to get Lymphedema therapy.  Discussed options she has explored.   Placed call to [facility], outpatient rehab regarding patient past referral for Lymphedema therapy, spoke with Alver Fisher that originally discussed co payment of $40 for each visit and anticipated need for 3 visits a week. Patient declined evaluation visit due to cost.   Izora Gala discussed options for payment that could be explored, Access 1 program, Has patient applied for financial assistance and received an approval letter ,with  that documentation she may be eligible, and will need to make  [place] rehab aware prior to scheduling visit.   Plan RN plan follow up call to patient in the next week for further assistance and care coordination reading Lymphedema therapy.   Joylene Draft, RN, [place] Management Coordinator  [phone]- Mobile [phone]- Toll Free Main Office

## 2018-02-24 NOTE — Progress Notes (Signed)
[facility] CSW Progress Note  CSW met w patient, reviewed financial need and unmet treatment goals.  CSW is assisting patient w application for [employer] [place] that may assist w unpaid medical bills.  [employer] [place] does not cover lymphedema treatment.  Awaiting completion of physician documentation form prior to submission.  Will also refer patient to [place] for small grant.  CSW spoke w patient's Medicaid worker at [place] ([place] [phone] x [phone]).  Per Brand, patient has MQB-E which pays Medicare premium only.  Does not pay for transportation, copays, deductibles and similar.  States that patient can submit unpaid medical expenses from Jan 2017 onwards to see if she meets criteria for full Medicaid.  Copies of bills can be faxed to Ms Brand at [phone].  Per Ms Marca Ancona, patient can also submit any medical expenses, including medications, to Food Therapist, music as this may impact her eligibility.  Financial Advocate Lendell Caprice asked to assist w this.  Per Ms Sharlet Salina, patient has been approved for help from [facility] w lymphedema sleeve.  Patient advised that her transportation can be provided through her Medicare (25 one way trips via [employer], patient now aware of contact information for scheduling).  Patient has also been referred to [place] [facility] [phone]) - rides must be scheduled in advance and are subject to driver availability. Patient aware of both resources.  Patient is already receiving help from [place] may be able to assist w small amount of treatment related costs that are not covered by other sources.  Patient aware of all the above.  Appreciate help from [employer] who is also engaged in finding appropriate community support resources for patient.  CSW has spoken w Joylene Draft RN re this case.  Edwyna Shell, LCSW Clinical Social Worker Phone:  [phone]

## 2018-02-25 ENCOUNTER — Encounter (HOSPITAL_COMMUNITY): Payer: Self-pay | Admitting: Lab

## 2018-02-25 NOTE — Progress Notes (Unsigned)
Referral sent to [facility] outpatient rehab for lymphedema clinic.  Records faxed to [phone].  Ph #[phone].  They will contact patient.

## 2018-02-26 MED ORDER — OCTREOTIDE ACETATE 30 MG IM KIT
PACK | INTRAMUSCULAR | Status: AC
  Filled 2018-02-26: qty 1

## 2018-03-02 ENCOUNTER — Encounter: Payer: Self-pay | Admitting: Licensed Clinical Social Worker

## 2018-03-02 ENCOUNTER — Other Ambulatory Visit: Payer: Self-pay | Admitting: Licensed Clinical Social Worker

## 2018-03-02 NOTE — Patient Outreach (Signed)
Assessment:  CSW received referral on [place].  CSW completed chart review on client on 03/02/18.  Landis Martins RN sent [facility] CSW referral on client. Client has financial stressors, transport needs and challenges related to receiving ongoing cancer treatments.  CSW called client on 03/02/18. CSW verified client identity. CSW received verbal permission from client on 03/02/18 for CSW to speak with client about client needs.  Client and CSW spoke of client needs. CSW and client completed needed [facility] assessments for client.  Client said she went to [facility] recently about sleeve for her arm. She said sleeve would have to be special made for client to provide help needed.  Such a sleeve can be made at provider in [place], [place] per client  Information.  Client is able to drive her car for short trips.  Client has been receiving cancer treatments for about 3 months.  She said she has had 11 cancer treatments and has 5 cancer treatments left.  Client said she plans to drive herself tomorrow to her cancer treatment appointment.  CSW spoke with client about client  care plan. CSW encouraged client to speak with CSW in next 30 days about financial resources for client in the community. CSW spoke with client  about [facility], in [place], [place] (food pantry).  Client also receives food help from [employer].  CSW also spoke with client about [place] (transport service agency).  Client said she has some difficulty sleeping occasionally.  She said she thinks sleeping difficulty is related to medications for chemotherapy she is taking currently.  Client said she has to rest after her cancer treatment is received.  CSW spoke with client about [place] [facility] and transport help with that agency.  CSW spoke with client about [facility] and resources possibly with that agency. CSW thanked client for phone call with CSW  on 03/02/18 . CSW encouraged Ja[place] to call CSW at [phone] as needed to discuss social work needs of client. Client is also receiving [facility] nursing support with RN Landis Martins.    Plan:  Client to speak with CSW in next 30 days about financial resources for client in the community.  CSW to call client in 3 weeks to assess client needs.  CSW to collaborate with RN Landis Martins as needed to monitor client needs.    Norva Riffle.Lura Falor MSW, LCSW Licensed Clinical Social Worker [facility] Care Management [phone]

## 2018-03-03 ENCOUNTER — Encounter: Payer: Self-pay | Admitting: General Practice

## 2018-03-03 ENCOUNTER — Inpatient Hospital Stay (HOSPITAL_COMMUNITY): Payer: Medicare Other

## 2018-03-03 ENCOUNTER — Encounter (HOSPITAL_COMMUNITY): Payer: Self-pay

## 2018-03-03 VITALS — BP 135/74 | HR 103 | Temp 97.9°F | Resp 20 | Wt 272.0 lb

## 2018-03-03 DIAGNOSIS — C50912 Malignant neoplasm of unspecified site of left female breast: Secondary | ICD-10-CM

## 2018-03-03 DIAGNOSIS — E876 Hypokalemia: Secondary | ICD-10-CM

## 2018-03-03 DIAGNOSIS — Z5111 Encounter for antineoplastic chemotherapy: Secondary | ICD-10-CM | POA: Diagnosis not present

## 2018-03-03 DIAGNOSIS — T451X5A Adverse effect of antineoplastic and immunosuppressive drugs, initial encounter: Secondary | ICD-10-CM

## 2018-03-03 DIAGNOSIS — Z171 Estrogen receptor negative status [ER-]: Principal | ICD-10-CM

## 2018-03-03 DIAGNOSIS — G620 Drug-induced polyneuropathy: Secondary | ICD-10-CM

## 2018-03-03 DIAGNOSIS — C50412 Malignant neoplasm of upper-outer quadrant of left female breast: Secondary | ICD-10-CM

## 2018-03-03 DIAGNOSIS — C50919 Malignant neoplasm of unspecified site of unspecified female breast: Secondary | ICD-10-CM

## 2018-03-03 LAB — CBC WITH DIFFERENTIAL/PLATELET
BASOS ABS: 0.1 10*3/uL (ref 0.0–0.1)
BASOS PCT: 1 %
Eosinophils Absolute: 0.2 10*3/uL (ref 0.0–0.7)
Eosinophils Relative: 3 %
HEMATOCRIT: 33.9 % — AB (ref 36.0–46.0)
HEMOGLOBIN: 11.2 g/dL — AB (ref 12.0–15.0)
Lymphocytes Relative: 28 %
Lymphs Abs: 2 10*3/uL (ref 0.7–4.0)
MCH: 30.6 pg (ref 26.0–34.0)
MCHC: 33 g/dL (ref 30.0–36.0)
MCV: 92.6 fL (ref 78.0–100.0)
MONO ABS: 0.5 10*3/uL (ref 0.1–1.0)
Monocytes Relative: 7 %
NEUTROS PCT: 61 %
Neutro Abs: 4.5 10*3/uL (ref 1.7–7.7)
Platelets: 291 10*3/uL (ref 150–400)
RBC: 3.66 MIL/uL — ABNORMAL LOW (ref 3.87–5.11)
RDW: 15.3 % (ref 11.5–15.5)
WBC: 7.3 10*3/uL (ref 4.0–10.5)

## 2018-03-03 LAB — HEPATIC FUNCTION PANEL
ALBUMIN: 3.5 g/dL (ref 3.5–5.0)
ALK PHOS: 69 U/L (ref 38–126)
ALT: 24 U/L (ref 14–54)
AST: 31 U/L (ref 15–41)
Bilirubin, Direct: 0.1 mg/dL (ref 0.1–0.5)
Indirect Bilirubin: 0.3 mg/dL (ref 0.3–0.9)
TOTAL PROTEIN: 6.7 g/dL (ref 6.5–8.1)
Total Bilirubin: 0.4 mg/dL (ref 0.3–1.2)

## 2018-03-03 LAB — BASIC METABOLIC PANEL
Anion gap: 13 (ref 5–15)
BUN: 10 mg/dL (ref 6–20)
CHLORIDE: 100 mmol/L — AB (ref 101–111)
CO2: 24 mmol/L (ref 22–32)
CREATININE: 0.78 mg/dL (ref 0.44–1.00)
Calcium: 9.3 mg/dL (ref 8.9–10.3)
Glucose, Bld: 153 mg/dL — ABNORMAL HIGH (ref 65–99)
Potassium: 3.4 mmol/L — ABNORMAL LOW (ref 3.5–5.1)
SODIUM: 137 mmol/L (ref 135–145)

## 2018-03-03 LAB — LACTATE DEHYDROGENASE: LDH: 156 U/L (ref 98–192)

## 2018-03-03 MED ORDER — HEPARIN SOD (PORK) LOCK FLUSH 100 UNIT/ML IV SOLN
500.0000 [IU] | Freq: Once | INTRAVENOUS | Status: AC | PRN
  Administered 2018-03-03: 500 [IU]

## 2018-03-03 MED ORDER — POTASSIUM CHLORIDE 10 MEQ/100ML IV SOLN
10.0000 meq | Freq: Once | INTRAVENOUS | Status: AC
  Administered 2018-03-03: 10 meq via INTRAVENOUS
  Filled 2018-03-03: qty 100

## 2018-03-03 MED ORDER — SODIUM CHLORIDE 0.9 % IV SOLN
Freq: Once | INTRAVENOUS | Status: AC
  Administered 2018-03-03: 12:00:00 via INTRAVENOUS

## 2018-03-03 MED ORDER — FAMOTIDINE IN NACL 20-0.9 MG/50ML-% IV SOLN
INTRAVENOUS | Status: AC
  Filled 2018-03-03: qty 50

## 2018-03-03 MED ORDER — SODIUM CHLORIDE 0.9 % IV SOLN
20.0000 mg | Freq: Once | INTRAVENOUS | Status: AC
  Administered 2018-03-03: 20 mg via INTRAVENOUS
  Filled 2018-03-03: qty 2

## 2018-03-03 MED ORDER — DIPHENHYDRAMINE HCL 50 MG/ML IJ SOLN
50.0000 mg | Freq: Once | INTRAMUSCULAR | Status: AC
  Administered 2018-03-03: 50 mg via INTRAVENOUS

## 2018-03-03 MED ORDER — SODIUM CHLORIDE 0.9% FLUSH
10.0000 mL | INTRAVENOUS | Status: DC | PRN
  Administered 2018-03-03: 10 mL
  Filled 2018-03-03: qty 10

## 2018-03-03 MED ORDER — FAMOTIDINE IN NACL 20-0.9 MG/50ML-% IV SOLN
20.0000 mg | Freq: Once | INTRAVENOUS | Status: AC
  Administered 2018-03-03: 20 mg via INTRAVENOUS

## 2018-03-03 MED ORDER — DIPHENHYDRAMINE HCL 50 MG/ML IJ SOLN
INTRAMUSCULAR | Status: AC
  Filled 2018-03-03: qty 1

## 2018-03-03 MED ORDER — SODIUM CHLORIDE 0.9 % IV SOLN
64.0000 mg/m2 | Freq: Once | INTRAVENOUS | Status: AC
  Administered 2018-03-03: 156 mg via INTRAVENOUS
  Filled 2018-03-03: qty 26

## 2018-03-03 NOTE — Progress Notes (Signed)
[facility] CSW Progress Notes  CSW called Cancer Care - patient will be receiving a $300 check, to be mailed on 4/12 to her home address.  This can be used for lymphedema treatment copays if desired.  Patient has also been approved for up to $250 in copay assistance from [facility] upon completion of their financial aid application.  CSW left VM for patient w this information.  Edwyna Shell, LCSW Clinical Social Worker Phone:  [phone]

## 2018-03-03 NOTE — Progress Notes (Signed)
To treatment room for chemotherapy.  Patient stated she has noticed more fatigue last week.  No change in SOB or chest discomfort.  Neuropathy in left foot has improved and no changes in fingertips.  Able to open jar lids, zippers, and buttons.  PRN nausea managed with medications.    Reviewed labs with Dr. Walden Field and Potassium 3.4 today.  Ok to treat today and give the patient IV potassium 10mg  verbal order Dr. Walden Field.    Patient tolerated chemotherapy and potassium with no complaints voiced.  Port site clean and dry with no bruising or swelling noted at site.  No complaints of pain with port flush.  Band aid applied.  VSS with discharge and left ambulatory with no s/s of distress noted.

## 2018-03-03 NOTE — Patient Instructions (Signed)
Center Discharge Instructions for Patients Receiving Chemotherapy  Today you received the following chemotherapy agents taxol.    If you develop nausea and vomiting that is not controlled by your nausea medication, call the clinic.   BELOW ARE SYMPTOMS THAT SHOULD BE REPORTED IMMEDIATELY:  *FEVER GREATER THAN 100.5 F  *CHILLS WITH OR WITHOUT FEVER  NAUSEA AND VOMITING THAT IS NOT CONTROLLED WITH YOUR NAUSEA MEDICATION  *UNUSUAL SHORTNESS OF BREATH  *UNUSUAL BRUISING OR BLEEDING  TENDERNESS IN MOUTH AND THROAT WITH OR WITHOUT PRESENCE OF ULCERS  *URINARY PROBLEMS  *BOWEL PROBLEMS  UNUSUAL RASH Items with * indicate a potential emergency and should be followed up as soon as possible.  Feel free to call the clinic should you have any questions or concerns. The clinic phone number is [phone].  Please show the [facility] at check-in to the Emergency Department and triage nurse.

## 2018-03-04 ENCOUNTER — Other Ambulatory Visit: Payer: Self-pay | Admitting: *Deleted

## 2018-03-04 ENCOUNTER — Encounter: Payer: Self-pay | Admitting: *Deleted

## 2018-03-04 NOTE — Patient Outreach (Signed)
[place] [facility]) Care Management  [name]  03/04/2018   Kelli Wilson 1951-06-16 [id number]  Subjective:  Telephone assessment   Referral received: 02/20/18 Referral source; [facility], Dr.Higgs Referral reason : Multiple psychosocial issues , financial concerns, transportation concerns, access to lymphedema treatment,  Insurance: Visual merchandiser outreach call to patient , HIPAA verified.  Patient discussed her recent chemotherapy treatment on yesterday, complaint of having diarrhea on today , but she has taken her prn lomotil that usually helps. Patient denies vomiting.  Patient reports she is still able to drive herself to appointment in [place].  Patient discussed her main concern is being able to lymphedema treatment preferably in [place] that she can drive to . Payment discussed recent visit to [facility] for fitting of lymphedema sleeve but it did not work, stating she needed a special sleeve that she would need  to be custom  fitted in [place].  Patient described swelling more on her side rather than her arm. Patient discussed referral has been sent for lymphedema therapy to [facility] and she has not heard from them yet.  Patient discussed Kelli Wilson foundation has called her to remind her of support meeting in the next week she is planning to attend.   Patient discussed she recently had to pay out $80 for yard work - states she used to do her own work until she had cancer.   Discussed with patient information documented by Kelli Wilson , regarding Cancer care  Check to be mailed to her and that she has been approved up to $250 in co pay assistance from Kelli Wilson upon completion of financial aid form.     Encounter Medications:  Outpatient Encounter Medications as of 03/04/2018  Medication Sig Note  . albuterol (PROVENTIL) (2.5 MG/3ML) 0.083% nebulizer solution Take 2.5 mg by nebulization every  6 (six) hours as needed for wheezing or shortness of breath.    . ALPRAZolam (XANAX) 1 MG tablet Take 5 mg by mouth 3 (three) times daily as needed for anxiety.    . Camphor-Eucalyptus-Menthol (VICKS VAPORUB EX) Apply 1 application daily as needed topically (congestion).   . Cholecalciferol (VITAMIN D3) 5000 units CAPS Take 5,000 Units by mouth daily.   . dapagliflozin propanediol (FARXIGA) 5 MG TABS tablet Take 5 mg by mouth daily. Only takes on days she takes steroids.   Marland Kitchen dicyclomine (BENTYL) 10 MG capsule Take 10 mg by mouth 4 (four) times daily -  before meals and at bedtime.   . Diphenhyd-Hydrocort-Nystatin (FIRST-DUKES MOUTHWASH) SUSP Swish and swallow 5 mLs no more than every 4 hours.   . diphenoxylate-atropine (LOMOTIL) 2.5-0.025 MG tablet Take 1 tablet by mouth 4 (four) times daily as needed for diarrhea or loose stools.   . gabapentin (NEURONTIN) 300 MG capsule Take 1 capsule (300 mg total) by mouth 3 (three) times daily. Reported on 03/12/2016   . HYDROcodone-acetaminophen (NORCO/VICODIN) 5-325 MG tablet Take 1 tablet by mouth every 4 (four) hours as needed.    . hydrOXYzine (ATARAX/VISTARIL) 50 MG tablet Take 50 mg by mouth at bedtime.  02/03/2018: Takes as needed.  . lidocaine (XYLOCAINE) 2 % solution    . Lidocaine 0.5 % GEL Apply 1 application topically as needed (for pain).   Marland Kitchen lidocaine-prilocaine (EMLA) cream Apply to affected area once 10/09/2017: Hasnt started  . magic mouthwash w/lidocaine SOLN TAKE ONE TEASPOONFUL (5ML) BY MOUTH SWISH AND SWALLOW NO MORE THAN EVERY FOUR HOURS.   Marland Kitchen  magnesium 30 MG tablet Take 30 mg by mouth 2 (two) times daily.   . metFORMIN (GLUCOPHAGE) 500 MG tablet Take 500 mg by mouth 2 (two) times daily with a meal.    . nitroGLYCERIN (NITROSTAT) 0.4 MG SL tablet Place 0.4 mg under the tongue every 5 (five) minutes as needed for chest pain.   Marland Kitchen omeprazole (PRILOSEC) 40 MG capsule Take 40 mg every other day by mouth. At night   . ondansetron (ZOFRAN) 8 MG  tablet Take 1 tablet (8 mg total) 2 (two) times daily as needed by mouth. Start on the third day after chemotherapy. 10/09/2017: Hasnt started  . pravastatin (PRAVACHOL) 40 MG tablet Take 40 mg by mouth daily.   . prochlorperazine (COMPAZINE) 10 MG tablet Take 1 tablet (10 mg total) every 6 (six) hours as needed by mouth (Nausea or vomiting). 10/09/2017: Hasnt started  . promethazine (PHENERGAN) 25 MG tablet Take 25 mg by mouth every 6 (six) hours as needed for nausea or vomiting.   . Pseudoeph-Doxylamine-DM-APAP (NYQUIL PO) Take 2 tablets at bedtime as needed by mouth (sinus headaches).   Marland Kitchen UNABLE TO FIND Med Name: Lymphedema sleeve    No facility-administered encounter medications on file as of 03/04/2018.     Functional Status:  In your present state of health, do you have any difficulty performing the following activities: 03/02/2018 09/15/2017  Hearing? N N  Vision? Y N  Difficulty concentrating or making decisions? Y N  Walking or climbing stairs? Y Y  Dressing or bathing? N N  Doing errands, shopping? Y N  Preparing Food and eating ? N -  Using the Toilet? N -  In the past six months, have you accidently leaked urine? N -  Do you have problems with loss of bowel control? N -  Managing your Medications? N -  Managing your Finances? N -  Housekeeping or managing your Housekeeping? N -  Some recent data might be hidden    Fall/Depression Screening: Fall Risk  03/02/2018  Falls in the past year? No  Risk for fall due to : Impaired balance/gait;Impaired mobility;Impaired vision   PHQ 2/9 Scores 03/02/2018 02/23/2018  PHQ - 2 Score 2 1  PHQ- 9 Score 9 -    Assessment: Patient will benefit from ongoing care management for care coordination related to lymphedema care.    Plan Placed call to [facility] verified they have received referral and plan to call patient regarding scheduling appointment in the next 3 business days and discuss co pay. Will discuss further resources for  patient getting assistance with lymphedema care, with [facility] care management  leadership and follow up with patient in the next 2 weeks by telephone and sooner new updates.  Will send PCP telephone visit notes .    THN CM Care Plan Problem One     Most Recent Value  Care Plan Problem One  Patient with concerns related to lymphedema and symptoms related to chemotherapy   Role Documenting the Problem One  Care Management [place] for Problem One  Active  [facility] Long Term Goal   Patient will report increase knowledge and have resources related to lymphedema in the next 31 days   THN Long Term Goal Start Date  03/04/18  Interventions for Problem One Long Term Goal  Reinforced with patient importance of taking all medications as prescribed and following MD recommended treatments.. Advised regarding notifying MD of unresolved diarrhea after prn medication.   THN CM Short Term  Goal #1   Patient will report attending appointment at [facility] lymphedema outpatient in the next 21 days   THN CM Short Term Goal #1 Start Date  03/04/18  Interventions for Short Term Goal #1  Placed call to [facility] outpatient occupational therapy to verify referral,   THN CM Short Term Goal #2   Patient will be able to report increased knowledge of signs of dehydration in the next 30 days    THN CM Short Term Goal #2 Start Date  03/04/18  Interventions for Short Term Goal #2  Advised patient regarding symptoms of dehydration, dry mouth, nausea, lightheadedness, to report symptoms to MD   [facility] CM Short Term Goal #3  Patient will be able to report drinking at least 64 oz of fluid after chemotherapy in the next 30 days    THN CM Short Term Goal #3 Start Date  03/04/18  Interventions for Short Tern Goal #3  Discussed with patient importance of staying hydrated, reviewed food and beverages that count toward fluid intake, water, tea, ensure , gatorade, ensure.       Joylene Draft, RN, [place] Management  Coordinator  [phone]- Mobile [phone]- Toll Free Main Office

## 2018-03-05 ENCOUNTER — Ambulatory Visit (INDEPENDENT_AMBULATORY_CARE_PROVIDER_SITE_OTHER): Payer: Medicare Other | Admitting: Internal Medicine

## 2018-03-09 ENCOUNTER — Other Ambulatory Visit (HOSPITAL_COMMUNITY): Payer: Self-pay

## 2018-03-09 DIAGNOSIS — R197 Diarrhea, unspecified: Secondary | ICD-10-CM

## 2018-03-09 DIAGNOSIS — Z171 Estrogen receptor negative status [ER-]: Principal | ICD-10-CM

## 2018-03-09 DIAGNOSIS — C50912 Malignant neoplasm of unspecified site of left female breast: Secondary | ICD-10-CM

## 2018-03-09 DIAGNOSIS — C50412 Malignant neoplasm of upper-outer quadrant of left female breast: Secondary | ICD-10-CM

## 2018-03-10 ENCOUNTER — Inpatient Hospital Stay (HOSPITAL_COMMUNITY): Payer: Medicare Other

## 2018-03-10 ENCOUNTER — Encounter (HOSPITAL_COMMUNITY): Payer: Self-pay | Admitting: Hematology

## 2018-03-10 ENCOUNTER — Encounter: Payer: Self-pay | Admitting: General Practice

## 2018-03-10 ENCOUNTER — Inpatient Hospital Stay (HOSPITAL_BASED_OUTPATIENT_CLINIC_OR_DEPARTMENT_OTHER): Payer: Medicare Other | Admitting: Hematology

## 2018-03-10 ENCOUNTER — Ambulatory Visit (HOSPITAL_COMMUNITY): Payer: Medicare Other | Admitting: Internal Medicine

## 2018-03-10 VITALS — BP 153/106 | HR 69 | Temp 98.5°F | Resp 18 | Wt 271.2 lb

## 2018-03-10 VITALS — BP 134/73 | HR 97 | Temp 97.8°F | Resp 18

## 2018-03-10 DIAGNOSIS — R112 Nausea with vomiting, unspecified: Secondary | ICD-10-CM

## 2018-03-10 DIAGNOSIS — C50412 Malignant neoplasm of upper-outer quadrant of left female breast: Secondary | ICD-10-CM | POA: Diagnosis not present

## 2018-03-10 DIAGNOSIS — C50912 Malignant neoplasm of unspecified site of left female breast: Secondary | ICD-10-CM

## 2018-03-10 DIAGNOSIS — Z171 Estrogen receptor negative status [ER-]: Principal | ICD-10-CM

## 2018-03-10 DIAGNOSIS — K580 Irritable bowel syndrome with diarrhea: Secondary | ICD-10-CM | POA: Diagnosis not present

## 2018-03-10 DIAGNOSIS — Z5111 Encounter for antineoplastic chemotherapy: Secondary | ICD-10-CM | POA: Diagnosis not present

## 2018-03-10 DIAGNOSIS — R197 Diarrhea, unspecified: Secondary | ICD-10-CM

## 2018-03-10 LAB — COMPREHENSIVE METABOLIC PANEL
ALT: 29 U/L (ref 14–54)
AST: 35 U/L (ref 15–41)
Albumin: 3.7 g/dL (ref 3.5–5.0)
Alkaline Phosphatase: 73 U/L (ref 38–126)
Anion gap: 14 (ref 5–15)
BUN: 11 mg/dL (ref 6–20)
CHLORIDE: 100 mmol/L — AB (ref 101–111)
CO2: 21 mmol/L — AB (ref 22–32)
CREATININE: 0.94 mg/dL (ref 0.44–1.00)
Calcium: 9.3 mg/dL (ref 8.9–10.3)
GFR calc non Af Amer: >60 mL/min (ref >60)
Glucose, Bld: 154 mg/dL — ABNORMAL HIGH (ref 65–99)
Potassium: 3.9 mmol/L (ref 3.5–5.1)
SODIUM: 135 mmol/L (ref 135–145)
Total Bilirubin: 0.4 mg/dL (ref 0.3–1.2)
Total Protein: 7 g/dL (ref 6.5–8.1)

## 2018-03-10 LAB — CBC WITH DIFFERENTIAL/PLATELET
BASOS ABS: 0.1 10*3/uL (ref 0.0–0.1)
Basophils Relative: 1 %
EOS ABS: 0.3 10*3/uL (ref 0.0–0.7)
EOS PCT: 4 %
HCT: 35.9 % — ABNORMAL LOW (ref 36.0–46.0)
Hemoglobin: 11.7 g/dL — ABNORMAL LOW (ref 12.0–15.0)
Lymphocytes Relative: 36 %
Lymphs Abs: 2.3 10*3/uL (ref 0.7–4.0)
MCH: 30.1 pg (ref 26.0–34.0)
MCHC: 32.6 g/dL (ref 30.0–36.0)
MCV: 92.3 fL (ref 78.0–100.0)
Monocytes Absolute: 0.5 10*3/uL (ref 0.1–1.0)
Monocytes Relative: 8 %
Neutro Abs: 3.2 10*3/uL (ref 1.7–7.7)
Neutrophils Relative %: 51 %
Platelets: 335 10*3/uL (ref 150–400)
RBC: 3.89 MIL/uL (ref 3.87–5.11)
RDW: 15.4 % (ref 11.5–15.5)
WBC: 6.3 10*3/uL (ref 4.0–10.5)

## 2018-03-10 MED ORDER — SODIUM CHLORIDE 0.9 % IV SOLN
53.3333 mg/m2 | Freq: Once | INTRAVENOUS | Status: AC
  Administered 2018-03-10: 132 mg via INTRAVENOUS
  Filled 2018-03-10: qty 22

## 2018-03-10 MED ORDER — SODIUM CHLORIDE 0.9 % IV SOLN
20.0000 mg | Freq: Once | INTRAVENOUS | Status: DC
  Filled 2018-03-10: qty 2

## 2018-03-10 MED ORDER — SODIUM CHLORIDE 0.9 % IV SOLN
Freq: Once | INTRAVENOUS | Status: DC
  Filled 2018-03-10: qty 4

## 2018-03-10 MED ORDER — SODIUM CHLORIDE 0.9% FLUSH
10.0000 mL | INTRAVENOUS | Status: DC | PRN
  Administered 2018-03-10: 10 mL
  Filled 2018-03-10: qty 10

## 2018-03-10 MED ORDER — DIPHENHYDRAMINE HCL 50 MG/ML IJ SOLN
50.0000 mg | Freq: Once | INTRAMUSCULAR | Status: AC
  Administered 2018-03-10: 50 mg via INTRAVENOUS
  Filled 2018-03-10: qty 1

## 2018-03-10 MED ORDER — SODIUM CHLORIDE 0.9 % IV SOLN
Freq: Once | INTRAVENOUS | Status: AC
  Administered 2018-03-10: 11:00:00 via INTRAVENOUS
  Filled 2018-03-10: qty 4

## 2018-03-10 MED ORDER — HEPARIN SOD (PORK) LOCK FLUSH 100 UNIT/ML IV SOLN
500.0000 [IU] | Freq: Once | INTRAVENOUS | Status: AC | PRN
  Administered 2018-03-10: 500 [IU]
  Filled 2018-03-10: qty 5

## 2018-03-10 MED ORDER — SODIUM CHLORIDE 0.9 % IV SOLN
Freq: Once | INTRAVENOUS | Status: AC
  Administered 2018-03-10: 11:00:00 via INTRAVENOUS

## 2018-03-10 MED ORDER — FAMOTIDINE IN NACL 20-0.9 MG/50ML-% IV SOLN
20.0000 mg | Freq: Once | INTRAVENOUS | Status: AC
  Administered 2018-03-10: 20 mg via INTRAVENOUS
  Filled 2018-03-10: qty 50

## 2018-03-10 NOTE — Patient Instructions (Signed)
[facility] Discharge Instructions for Patients Receiving Chemotherapy   Beginning January 23rd 2017 lab work for the Cancer Center will be done in the  Main lab at  on 1st floor. If you have a lab appointment with the Cancer Center please come in thru the  Main Entrance and check in at the main information desk   Today you received the following chemotherapy agents   To help prevent nausea and vomiting after your treatment, we encourage you to take your nausea medication     If you develop nausea and vomiting, or diarrhea that is not controlled by your medication, call the clinic.  The clinic phone number is [phone]. Office hours are Monday-Friday 8:30am-5:00pm.  BELOW ARE SYMPTOMS THAT SHOULD BE REPORTED IMMEDIATELY:  *FEVER GREATER THAN 101.0 F  *CHILLS WITH OR WITHOUT FEVER  NAUSEA AND VOMITING THAT IS NOT CONTROLLED WITH YOUR NAUSEA MEDICATION  *UNUSUAL SHORTNESS OF BREATH  *UNUSUAL BRUISING OR BLEEDING  TENDERNESS IN MOUTH AND THROAT WITH OR WITHOUT PRESENCE OF ULCERS  *URINARY PROBLEMS  *BOWEL PROBLEMS  UNUSUAL RASH Items with * indicate a potential emergency and should be followed up as soon as possible. If you have an emergency after office hours please contact your primary care physician or go to the nearest emergency department.  Please call the clinic during office hours if you have any questions or concerns.   You may also contact the Patient Navigator at [phone] should you have any questions or need assistance in obtaining follow up care.      Resources For Cancer Patients and their Caregivers ? American Cancer Society: Can assist with transportation, wigs, general needs, runs Look Good Feel Better.        1-888-227-6333 ? Cancer Care: Provides financial assistance, online support groups, medication/co-pay assistance.  1-800-813-HOPE (4673) ? [facility] Assists [place] cancer  patients and their families through emotional , educational and financial support.  336-427-4357 ? [place] DSS Where to apply for food stamps, Medicaid and utility assistance. 336-342-1394 ? [facility]: Transportation to medical appointments. 336-347-2287 ? Social Security Administration: May apply for disability if have a Stage IV cancer. 336-342-7796 1-800-772-1213 ? [place] Aging, Disability and Transit Services: Assists with nutrition, care and transit needs. 336-349-2343         

## 2018-03-10 NOTE — Progress Notes (Signed)
[facility] CSW Progress Notes  Met w patient during her weekly infusion.  States she remains concerned about her lymphedema treatment, has not been scheduled in [place] which she prefers in order to conserve gas money.  Let patient know she has been approved for a $300 one time grant from Cancer Care as well as up to $250 in co pay assistance from [facility].  Foundation has also made home visit and dropped off ready to eat food.  Per patient, her fatigue has been significant and she is too tired to properly prepare meals.  Wonders "what is next for me, I wish I knew what happens when I am done w these treatments, I dont know what to expect."  CSW offered referral for possible [employer] guide, peer mentor program for breast cancer patients.  Patient agreeable to referral for this.  CSW also faxed completed application for help to [facility] as patient is currently receiving some services from them.   , LCSW Clinical Social Worker Phone:  [phone]  

## 2018-03-10 NOTE — Progress Notes (Signed)
[place], [place] [address]   CLINIC:  Medical Oncology/Hematology  PCP:  Lucia Gaskins, MD [place] [id number] [phone]   REASON FOR VISIT:  Follow-up for breast cancer.  CURRENT THERAPY: Weekly paclitaxel.   CANCER STAGING: Cancer Staging Malignant neoplasm of upper-outer quadrant of left female breast ([place]) Staging form: Breast, AJCC 8th Edition - Clinical: cT1c, cN0 - Unsigned - Pathologic: Stage IB (pT1c, pN0, cM0, G3, ER: Negative, PR: Negative, HER2: Negative) - Signed by Twana First, MD on 10/09/2017    INTERVAL HISTORY:  [name] 67 y.o. female returns for routine follow-up and consideration for next cycle of chemotherapy.  She is due for week 10 of paclitaxel.  She is having a lot of tiredness for the past 3 weeks.  She also reports diarrhea which gets worse at times.  She is requiring 2-3 tablets of Lomotil on an average.  She has bouts of nausea.  She is taking Phenergan.  She had an episode of sore throat which got better.  She had chills and low-grade fever at that time.  She denies any numbness in the hands at this time.  She denies any recent hospitalizations.   REVIEW OF SYSTEMS:  Review of Systems  Constitutional: Positive for fatigue.  HENT:  Negative.   Respiratory: Positive for shortness of breath.   Cardiovascular: Negative.   Gastrointestinal: Positive for diarrhea, nausea and vomiting.  Genitourinary: Negative.    Musculoskeletal: Negative.   Skin: Negative.   Neurological: Negative.   Hematological: Negative.   Psychiatric/Behavioral: Negative.      PAST MEDICAL/SURGICAL HISTORY:  Past Medical History:  Diagnosis Date  . Anxiety   . Arthritis   . Back pain   . Byssinosis ([place])    secondary to working in a [employer]  . Chest pain   . Chronic pain   . Depression   . Diabetes mellitus without complication ([place])   . Fibromyalgia   . GERD (gastroesophageal  reflux disease)   . Hypercholesteremia   . Hypertension   . Knee pain   . Obesity   . Peptic ulcer    history of - normal GI studies Sept 2010   Past Surgical History:  Procedure Laterality Date  . APPENDECTOMY    . bladder rectal repair    . BREAST SURGERY Left    breast biopsy- benign  . CHOLECYSTECTOMY    . DILATION AND CURETTAGE OF UTERUS    . GANGLION CYST EXCISION Left   . MASTECTOMY MODIFIED RADICAL Left 09/15/2017   Procedure: LEFT MODIFIED RADICAL MASTECTOMY;  Surgeon: Aviva Signs, MD;  Location: [facility];  Service: General;  Laterality: Left;  . PORTACATH PLACEMENT Right 10/13/2017   Procedure: INSERTION PORT-A-CATH RIGHT SUBCLAVIAN;  Surgeon: Aviva Signs, MD;  Location: [facility];  Service: General;  Laterality: Right;  . TOTAL ABDOMINAL HYSTERECTOMY       SOCIAL HISTORY:  Social History   Socioeconomic History  . Marital status: Married    Spouse name: Not on file  . Number of children: Not on file  . Years of education: Not on file  . Highest education level: Not on file  Occupational History  . 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 Financial resource strain: Not on file  . Food insecurity:    Worry: Not on file    Inability: Not on file  . Transportation needs:  Medical: Not on file    Non-medical: Not on file  Tobacco Use  . Smoking status: Never Smoker  . Smokeless tobacco: Current User    Types: Snuff  Substance and Sexual Activity  . Alcohol use: No  . Drug use: No  . Sexual activity: Never    Birth control/protection: Surgical  Lifestyle  . Physical activity:    Days per week: Not on file    Minutes per session: Not on file  . Stress: Not on file  Relationships  . Social connections:    Talks on phone: Not on file    Gets together: Not on file    Attends religious service: Not on file    Active member of club or organization: Not on file    Attends meetings of clubs or  organizations: Not on file    Relationship status: Not on file  . Intimate partner violence:    Fear of current or ex partner: Not on file    Emotionally abused: Not on file    Physically abused: Not on file    Forced sexual activity: Not on file  Other Topics Concern  . Not on file  Social History Narrative  . Not on file    FAMILY HISTORY:  Family History  Problem Relation Age of Onset  . Aneurysm Father   . Heart disease Father   . COPD Mother   . Diabetes Mother     CURRENT MEDICATIONS:  Outpatient Encounter Medications as of 03/10/2018  Medication Sig Note  . albuterol (PROVENTIL) (2.5 MG/3ML) 0.083% nebulizer solution Take 2.5 mg by nebulization every 6 (six) hours as needed for wheezing or shortness of breath.    . ALPRAZolam (XANAX) 1 MG tablet Take 5 mg by mouth 3 (three) times daily as needed for anxiety.    . Camphor-Eucalyptus-Menthol (VICKS VAPORUB EX) Apply 1 application daily as needed topically (congestion).   . Cholecalciferol (VITAMIN D3) 5000 units CAPS Take 5,000 Units by mouth daily.   . dapagliflozin propanediol (FARXIGA) 5 MG TABS tablet Take 5 mg by mouth daily. Only takes on days she takes steroids.   Marland Kitchen dicyclomine (BENTYL) 10 MG capsule Take 10 mg by mouth 4 (four) times daily -  before meals and at bedtime.   . Diphenhyd-Hydrocort-Nystatin (FIRST-DUKES MOUTHWASH) SUSP Swish and swallow 5 mLs no more than every 4 hours.   . diphenoxylate-atropine (LOMOTIL) 2.5-0.025 MG tablet Take 1 tablet by mouth 4 (four) times daily as needed for diarrhea or loose stools.   . gabapentin (NEURONTIN) 300 MG capsule Take 1 capsule (300 mg total) by mouth 3 (three) times daily. Reported on 03/12/2016   . HYDROcodone-acetaminophen (NORCO/VICODIN) 5-325 MG tablet Take 1 tablet by mouth every 4 (four) hours as needed.    . hydrOXYzine (ATARAX/VISTARIL) 50 MG tablet Take 50 mg by mouth at bedtime.  02/03/2018: Takes as needed.  . lidocaine (XYLOCAINE) 2 % solution    . Lidocaine  0.5 % GEL Apply 1 application topically as needed (for pain).   Marland Kitchen lidocaine-prilocaine (EMLA) cream Apply to affected area once 10/09/2017: Hasnt started  . magic mouthwash w/lidocaine SOLN TAKE ONE TEASPOONFUL (5ML) BY MOUTH SWISH AND SWALLOW NO MORE THAN EVERY FOUR HOURS.   . magnesium 30 MG tablet Take 30 mg by mouth 2 (two) times daily.   . metFORMIN (GLUCOPHAGE) 500 MG tablet Take 500 mg by mouth 2 (two) times daily with a meal.    . nitroGLYCERIN (NITROSTAT) 0.4 MG SL tablet Place  0.4 mg under the tongue every 5 (five) minutes as needed for chest pain.   Marland Kitchen omeprazole (PRILOSEC) 40 MG capsule Take 40 mg every other day by mouth. At night   . ondansetron (ZOFRAN) 8 MG tablet Take 1 tablet (8 mg total) 2 (two) times daily as needed by mouth. Start on the third day after chemotherapy. 10/09/2017: Hasnt started  . pravastatin (PRAVACHOL) 40 MG tablet Take 40 mg by mouth daily.   . prochlorperazine (COMPAZINE) 10 MG tablet Take 1 tablet (10 mg total) every 6 (six) hours as needed by mouth (Nausea or vomiting). 10/09/2017: Hasnt started  . promethazine (PHENERGAN) 25 MG tablet Take 25 mg by mouth every 6 (six) hours as needed for nausea or vomiting.   . Pseudoeph-Doxylamine-DM-APAP (NYQUIL PO) Take 2 tablets at bedtime as needed by mouth (sinus headaches).   Marland Kitchen UNABLE TO FIND Med Name: Lymphedema sleeve    No facility-administered encounter medications on file as of 03/10/2018.     ALLERGIES:  Allergies  Allergen Reactions  . Magnesium Sulfate Shortness Of Breath    Experienced shortness of breath and chest pain during mag sulfate infusion. -11/21/17 -gwd  . Other Anaphylaxis and Other (See Comments)    Lysol and other cleaning products  . Iohexol Other (See Comments)     Code: HIVES, Desc: PT STATES SHE WAS GIVEN IV DYE AT [facility] AND BROKE OUT IN HIVES, RASH, AND HAD TO BE HOSPITALIZED DUE TO REACTION   . Ivp Dye [Iodinated Diagnostic Agents] Other (See Comments)    Unknown  . Januvia  [Sitagliptin]     Stomach pains   . Latex Itching  . Motrin [Ibuprofen] Other (See Comments)    Unknown  . Nsaids Other (See Comments)    Unknown  . Statins Other (See Comments)    Unknown  . Sulfa Antibiotics Other (See Comments)    Unknown  . Adhesive [Tape] Rash     PHYSICAL EXAM:  ECOG Performance status: 1  Vitals:   03/10/18 0916  BP: (!) 153/106  Pulse: 69  Resp: 18  Temp: 98.5 F (36.9 C)  SpO2: 98%   Filed Weights   03/10/18 0916  Weight: 271 lb 3.2 oz (123 kg)    Physical Exam  Constitutional: She appears well-developed and well-nourished.  HENT:  Mouth/Throat: Oropharynx is clear and moist.     LABORATORY DATA:  I have reviewed the labs as listed.  CBC    Component Value Date/Time   WBC 6.3 03/10/2018 0843   RBC 3.89 03/10/2018 0843   HGB 11.7 (L) 03/10/2018 0843   HCT 35.9 (L) 03/10/2018 0843   PLT 335 03/10/2018 0843   MCV 92.3 03/10/2018 0843   MCH 30.1 03/10/2018 0843   MCHC 32.6 03/10/2018 0843   RDW 15.4 03/10/2018 0843   LYMPHSABS 2.3 03/10/2018 0843   MONOABS 0.5 03/10/2018 0843   EOSABS 0.3 03/10/2018 0843   BASOSABS 0.1 03/10/2018 0843   CMP Latest Ref Rng & Units 03/10/2018 03/03/2018 02/24/2018  Glucose 65 - 99 mg/dL 154(H) 153(H) 130(H)  BUN 6 - 20 mg/dL _0 Creatinine 0.44 - 1.00 mg/dL 0.94 0.78 0.75  Sodium 135 - 145 mmol/L 135 137 138  Potassium 3.5 - 5.1 mmol/L 3.9 3.4(L) 3.2(L)  Chloride 101 - 111 mmol/L 100(L) 100(L) 102  CO2 22 - 32 mmol/L 21(L) 24 23  Calcium 8.9 - 10.3 mg/dL 9.3 9.3 9.0  Total Protein 6.5 - 8.1 g/dL 7.0 6.7 6.6  Total Bilirubin  0.3 - 1.2 mg/dL 0.4 0.4 0.5  Alkaline Phos 38 - 126 U/L 73 69 64  AST 15 - 41 U/L 35 31 33  ALT 14 - 54 U/L _0 ASSESSMENT & PLAN:   Malignant neoplasm of upper-outer quadrant of left female breast (HCC) 1. Stage Ib (PT1CPN0) triple negative left breast cancer: - Status post left modified radical mastectomy and lymph node dissection on 09/15/2017,  pathology showing triple negative IDC, 1.8 cm, margins negative, 8 out of 8 negative lymph nodes, grade 3 - She is here for week 10 of paclitaxel.  I have dose reduced her paclitaxel by 20% during week 5 secondary to neuropathy in the fingertips.  She no longer has neuropathy.  She is having some trouble with diarrhea from IBS.  She is having to take Lomotil 2-3 times per day. -Today she will proceed with week 10 of paclitaxel.  She is also complaining of severe tiredness.  I will further dose reduce for the rest of the 3 cycles.  2.  Nausea: She has considerable nausea and occasional vomiting at home.  She is taking Phenergan as needed.  I will add Zofran to the premeds prior to paclitaxel.  3.  IBS: She is having episodes of diarrhea for which she is taking Lomotil about 2-3 tablets/day.      Orders placed this encounter:  Orders Placed This Encounter  Procedures  . CBC with Differential  . Comprehensive metabolic panel      Derek Jack, MD [place] [phone]

## 2018-03-10 NOTE — Assessment & Plan Note (Signed)
1. Stage Ib (PT1CPN0) triple negative left breast cancer: - Status post left modified radical mastectomy and lymph node dissection on 09/15/2017, pathology showing triple negative IDC, 1.8 cm, margins negative, 8 out of 8 negative lymph nodes, grade 3 - She is here for week 10 of paclitaxel.  I have dose reduced her paclitaxel by 20% during week 5 secondary to neuropathy in the fingertips.  She no longer has neuropathy.  She is having some trouble with diarrhea from IBS.  She is having to take Lomotil 2-3 times per day. -Today she will proceed with week 10 of paclitaxel.  She is also complaining of severe tiredness.  I will further dose reduce for the rest of the 3 cycles.  2.  Nausea: She has considerable nausea and occasional vomiting at home.  She is taking Phenergan as needed.  I will add Zofran to the premeds prior to paclitaxel.  3.  IBS: She is having episodes of diarrhea for which she is taking Lomotil about 2-3 tablets/day.

## 2018-03-10 NOTE — Progress Notes (Signed)
Labs reviewed and patient examined by MD today.  Proceed with treatment.  Treatment given per orders. Patient tolerated it well without problems. Vitals stable and discharged home from clinic ambulatory. Follow up as scheduled.

## 2018-03-11 ENCOUNTER — Other Ambulatory Visit: Payer: Self-pay | Admitting: *Deleted

## 2018-03-11 NOTE — Patient Outreach (Signed)
[facility]) Care Management  03/11/2018  GERALYN FIGIEL [birth year] [id number]  Telephone follow up call   Referral received: 02/20/18 Referral source; [facility], Dr.Higgs Referral reason : Multiple psychosocial issues , financial concerns, transportation concerns, access to lymphedema treatment,  Insurance: [employer]   Unsuccessful telephone outreach call, unable to leave a message , allowed to ring multiple times., no answering machine pick.   Plan  Will await return call, if no response will plan call in the next 2 days    Joylene Draft, RN, [place] Management Coordinator  [phone]- Mobile [phone]- [place]

## 2018-03-12 ENCOUNTER — Other Ambulatory Visit: Payer: Self-pay | Admitting: *Deleted

## 2018-03-12 NOTE — Patient Outreach (Signed)
[place] [facility]) Care Management  03/12/2018  VEATRICE ECKSTEIN 1951-01-06 [id number]   Referral received: 02/20/18 Referral source; [facility], Dr.Higgs Referral reason : Multiple psychosocial issues , financial concerns, transportation concerns, access to lymphedema treatment,  Insurance: [employer] return cal from [employer] verified.  Patient reports she is feeling some better on today, Patient discussed diarrhea is better , no loose stools on today.  Patient discussed tolerating drinking liquids, water , spirte. Patient reports she has eaten breakfast on this morning and tolerated well Patient discussed she has 2 more treatments.  Patient reports the [place] just delivered her a box of food that included a ham that she will cook.  Patient also has received a check from an agency and holding on to it until to use for her lymphedema therapy. Patient discussed sh  has been told kynnadi dicenso will help provide transportation to place in [place] for lymphedema sleeve .  Patient states she has not received phone call from [facility] regarding lymphedema therapy appointment. Patient states that is one her her major concerns getting started with therapy, due to swelling under arm area is uncomfortable at times, states she doesn't have as much swelling in arm as under arm area spreading to back on side of mastectomy.   Plan  Placed call to [facility] [place] Lymphedema therapy department , had to leave a message, with return call number.  Will request assistance from [facility] giving committee regarding assistance with lymphedema sleeve when cost determined after therapy identifies need.  Will plan return call to patient in the next week.    Joylene Draft, RN, [place] Management Coordinator  [phone]- Mobile [phone]- Toll Free Main Office

## 2018-03-13 ENCOUNTER — Ambulatory Visit: Payer: Self-pay | Admitting: *Deleted

## 2018-03-16 ENCOUNTER — Other Ambulatory Visit: Payer: Self-pay | Admitting: *Deleted

## 2018-03-16 NOTE — Patient Outreach (Signed)
[facility]) Care Management  03/16/2018  Kelli Wilson Jul 08, 1951 [id number]   Care Coordination call  Placed call to [facility] to follow up  On MD referral to Occupational therapy,for Lymphedema care, spoke with Waldemar Dickens , states they have completed authorization information. Danae Chen states that she can contact patient on today regarding scheduling initial visit.   Plan Follow up call to patient planned for this week.   Joylene Draft, RN, [place] Management Coordinator  [phone]- Mobile [phone]- Toll Free Main Office

## 2018-03-17 ENCOUNTER — Inpatient Hospital Stay (HOSPITAL_COMMUNITY): Payer: Medicare Other

## 2018-03-17 ENCOUNTER — Encounter: Payer: Self-pay | Admitting: General Practice

## 2018-03-17 ENCOUNTER — Other Ambulatory Visit: Payer: Self-pay

## 2018-03-17 VITALS — BP 131/82 | HR 110 | Temp 98.0°F | Resp 18 | Wt 268.0 lb

## 2018-03-17 DIAGNOSIS — Z171 Estrogen receptor negative status [ER-]: Principal | ICD-10-CM

## 2018-03-17 DIAGNOSIS — Z5111 Encounter for antineoplastic chemotherapy: Secondary | ICD-10-CM | POA: Diagnosis not present

## 2018-03-17 DIAGNOSIS — C50412 Malignant neoplasm of upper-outer quadrant of left female breast: Secondary | ICD-10-CM

## 2018-03-17 LAB — COMPREHENSIVE METABOLIC PANEL
ALK PHOS: 77 U/L (ref 38–126)
ALT: 29 U/L (ref 14–54)
ANION GAP: 15 (ref 5–15)
AST: 30 U/L (ref 15–41)
Albumin: 3.8 g/dL (ref 3.5–5.0)
BUN: 9 mg/dL (ref 6–20)
CALCIUM: 8.9 mg/dL (ref 8.9–10.3)
CO2: 20 mmol/L — ABNORMAL LOW (ref 22–32)
CREATININE: 0.89 mg/dL (ref 0.44–1.00)
Chloride: 101 mmol/L (ref 101–111)
Glucose, Bld: 148 mg/dL — ABNORMAL HIGH (ref 65–99)
Potassium: 3.7 mmol/L (ref 3.5–5.1)
SODIUM: 136 mmol/L (ref 135–145)
TOTAL PROTEIN: 7.1 g/dL (ref 6.5–8.1)
Total Bilirubin: 0.6 mg/dL (ref 0.3–1.2)

## 2018-03-17 LAB — CBC WITH DIFFERENTIAL/PLATELET
Basophils Absolute: 0.1 10*3/uL (ref 0.0–0.1)
Basophils Relative: 1 %
EOS ABS: 0.3 10*3/uL (ref 0.0–0.7)
EOS PCT: 3 %
HCT: 36.3 % (ref 36.0–46.0)
HEMOGLOBIN: 11.9 g/dL — AB (ref 12.0–15.0)
LYMPHS ABS: 2.3 10*3/uL (ref 0.7–4.0)
Lymphocytes Relative: 29 %
MCH: 30.3 pg (ref 26.0–34.0)
MCHC: 32.8 g/dL (ref 30.0–36.0)
MCV: 92.4 fL (ref 78.0–100.0)
MONOS PCT: 7 %
Monocytes Absolute: 0.6 10*3/uL (ref 0.1–1.0)
Neutro Abs: 4.8 10*3/uL (ref 1.7–7.7)
Neutrophils Relative %: 60 %
Platelets: 344 10*3/uL (ref 150–400)
RBC: 3.93 MIL/uL (ref 3.87–5.11)
RDW: 15.3 % (ref 11.5–15.5)
WBC: 8 10*3/uL (ref 4.0–10.5)

## 2018-03-17 MED ORDER — FAMOTIDINE IN NACL 20-0.9 MG/50ML-% IV SOLN
20.0000 mg | Freq: Once | INTRAVENOUS | Status: AC
  Administered 2018-03-17: 20 mg via INTRAVENOUS

## 2018-03-17 MED ORDER — SODIUM CHLORIDE 0.9% FLUSH
10.0000 mL | INTRAVENOUS | Status: DC | PRN
  Administered 2018-03-17: 10 mL
  Filled 2018-03-17: qty 10

## 2018-03-17 MED ORDER — LORAZEPAM 2 MG/ML IJ SOLN
INTRAMUSCULAR | Status: AC
  Filled 2018-03-17: qty 1

## 2018-03-17 MED ORDER — ONDANSETRON 8 MG PO TBDP
8.0000 mg | ORAL_TABLET | Freq: Once | ORAL | Status: AC
  Administered 2018-03-17: 8 mg via ORAL
  Filled 2018-03-17: qty 1

## 2018-03-17 MED ORDER — DIPHENHYDRAMINE HCL 50 MG/ML IJ SOLN
INTRAMUSCULAR | Status: AC
  Filled 2018-03-17: qty 1

## 2018-03-17 MED ORDER — LORAZEPAM 2 MG/ML IJ SOLN
0.5000 mg | Freq: Once | INTRAMUSCULAR | Status: AC
  Administered 2018-03-17: 0.5 mg via INTRAVENOUS

## 2018-03-17 MED ORDER — SODIUM CHLORIDE 0.9 % IV SOLN
53.3333 mg/m2 | Freq: Once | INTRAVENOUS | Status: AC
  Administered 2018-03-17: 132 mg via INTRAVENOUS
  Filled 2018-03-17: qty 22

## 2018-03-17 MED ORDER — SODIUM CHLORIDE 0.9 % IV SOLN
Freq: Once | INTRAVENOUS | Status: AC
  Administered 2018-03-17: 11:00:00 via INTRAVENOUS

## 2018-03-17 MED ORDER — SODIUM CHLORIDE 0.9 % IV SOLN: Freq: Once | INTRAVENOUS | Status: DC

## 2018-03-17 MED ORDER — DIPHENHYDRAMINE HCL 50 MG/ML IJ SOLN
50.0000 mg | Freq: Once | INTRAMUSCULAR | Status: AC
  Administered 2018-03-17: 50 mg via INTRAVENOUS

## 2018-03-17 MED ORDER — HEPARIN SOD (PORK) LOCK FLUSH 100 UNIT/ML IV SOLN
500.0000 [IU] | Freq: Once | INTRAVENOUS | Status: AC | PRN
  Administered 2018-03-17: 500 [IU]
  Filled 2018-03-17: qty 5

## 2018-03-17 MED ORDER — FAMOTIDINE IN NACL 20-0.9 MG/50ML-% IV SOLN
INTRAVENOUS | Status: AC
  Filled 2018-03-17: qty 50

## 2018-03-17 MED ORDER — SODIUM CHLORIDE 0.9 % IV SOLN
20.0000 mg | Freq: Once | INTRAVENOUS | Status: AC
  Administered 2018-03-17: 20 mg via INTRAVENOUS
  Filled 2018-03-17: qty 2

## 2018-03-17 NOTE — Progress Notes (Signed)
[facility] CSW Progress Note  CSW met w patient, reviewed financial need and unmet treatment goals.  CSW is assisting patient w application for [employer] [place] that may assist w unpaid medical bills.  [employer] [place] does not cover lymphedema treatment.  Awaiting completion of physician documentation form prior to submission.  Will also refer patient to [place] for small grant.  CSW spoke w patient's Medicaid worker at [place] ([place] [phone] x [phone]).  Per Brand, patient has MQB-E which pays Medicare premium only.  Does not pay for transportation, copays, deductibles and similar.  States that patient can submit unpaid medical expenses from Jan 2019 onwards to see if she meets criteria for full Medicaid.  Copies of bills can be faxed to Ms Brand at [phone].  Per Ms Marca Ancona, patient can also submit any medical expenses, including medications, to Food Therapist, music as this may impact her eligibility.  Financial Advocate Lendell Caprice asked to assist w this.  Per Ms Sharlet [place], patient has been approved for help from [facility] w lymphedema sleeve.  Patient advised that her transportation can be provided through her Medicare (25 one way trips via [employer], patient now aware of contact information for scheduling).  Patient has also been referred to [place] to Recovery [phone]) - rides must be scheduled in advance and are subject to driver availability. Patient aware of both resources.  Patient is already receiving help from [place] may be able to assist w small amount of treatment related costs that are not covered by other sources.  Patient aware of all the above.  Appreciate help from [employer] who is also engaged in finding appropriate community support resources for patient.  CSW has spoken w Joylene Draft RN re this case.  Edwyna Shell, LCSW Clinical Social Worker Phone:  [phone]

## 2018-03-17 NOTE — Progress Notes (Signed)
Labs reviewed by MD. Proceed with treatment.  Patient complained of nausea at the end of her treatment today. Notified Mike Craze NP, will given Ativan per orders.    Patient stated that she felt better after ativan.   Treatment given per orders. Patient tolerated it well without problems. Vitals stable and discharged home from clinic ambulatory. Follow up as scheduled.

## 2018-03-17 NOTE — Progress Notes (Signed)
[facility] CSW Progress Note  Met w patient, reviewed current plan. Patient has received $300 check from [place], has also been approved for $250 copay grant (paid directly to provider) from [employer].  Patient has first appt for lymphedema treatment at facility in [place], closer to her home.  Gave CSW copy of billing statement received from insurance company for period of 1/1 - 02/22/18 - faxed to [place] for screening for Medicaid eligibility.  CSW let patient know that she needs to meet specific criteria to receive Medicaid, encouraged to bring all medical bills so they can be considered.  Patient showed CSW copy of financial aid eligibility letter from [facility] - pt is eligible for 100% discount however is still responsible for all insurance copays.  Patient understands.  Appreciative for [employer] from [employer] as well as assistance from [place] who is working to assist w lymphedema treatment.  Edwyna Shell, LCSW Clinical Social Worker Phone:  [phone]

## 2018-03-18 ENCOUNTER — Other Ambulatory Visit: Payer: Self-pay | Admitting: *Deleted

## 2018-03-18 NOTE — Patient Instructions (Signed)
[facility] Discharge Instructions for Patients Receiving Chemotherapy   Beginning January 23rd 2017 lab work for the Cancer Center will be done in the  Main lab at [place] on 1st floor. If you have a lab appointment with the Cancer Center please come in thru the  Main Entrance and check in at the main information desk   Today you received the following chemotherapy agents   To help prevent nausea and vomiting after your treatment, we encourage you to take your nausea medication     If you develop nausea and vomiting, or diarrhea that is not controlled by your medication, call the clinic.  The clinic phone number is [phone]. Office hours are Monday-Friday 8:30am-5:00pm.  BELOW ARE SYMPTOMS THAT SHOULD BE REPORTED IMMEDIATELY:  *FEVER GREATER THAN 101.0 F  *CHILLS WITH OR WITHOUT FEVER  NAUSEA AND VOMITING THAT IS NOT CONTROLLED WITH YOUR NAUSEA MEDICATION  *UNUSUAL SHORTNESS OF BREATH  *UNUSUAL BRUISING OR BLEEDING  TENDERNESS IN MOUTH AND THROAT WITH OR WITHOUT PRESENCE OF ULCERS  *URINARY PROBLEMS  *BOWEL PROBLEMS  UNUSUAL RASH Items with * indicate a potential emergency and should be followed up as soon as possible. If you have an emergency after office hours please contact your primary care physician or go to the nearest emergency department.  Please call the clinic during office hours if you have any questions or concerns.   You may also contact the Patient Navigator at [phone] should you have any questions or need assistance in obtaining follow up care.      Resources For Cancer Patients and their Caregivers ? American Cancer Society: Can assist with transportation, wigs, general needs, runs Look Good Feel Better.        1-888-227-6333 ? Cancer Care: Provides financial assistance, online support groups, medication/co-pay assistance.  1-800-813-HOPE (4673) ? [facility] Assists [place] cancer  patients and their families through emotional , educational and financial support.  336-427-4357 ? [place] DSS Where to apply for food stamps, Medicaid and utility assistance. 336-342-1394 ? RCATS: Transportation to medical appointments. 336-347-2287 ? Social Security Administration: May apply for disability if have a Stage IV cancer. 336-342-7796 1-800-772-1213 ? [place] Aging, Disability and Transit Services: Assists with nutrition, care and transit needs. 336-349-2343         

## 2018-03-18 NOTE — Patient Outreach (Signed)
[facility]) Care Management  03/18/2018  Kelli Wilson 1951/01/09 [id number]   Referral received: 02/20/18 Referral source; [facility], Dr.Higgs Referral reason : Multiple psychosocial issues , financial concerns, transportation concerns, access to lymphedema treatment,  Insurance: [employer] telephone outreach call to patient, no answer able to leave a HIPAA compliant message for return call.   1130  Received return call from patient, HIPAA information verified  Patient reports being tired, discussed having more nausea yesterday after chemo treatment, but overall diarrhea has improved and daily nausea, because she takes a nausea pill each morning.  Patient discussed going to food bank on this morning and plans to attend food bank this afternoon.   Patient discussed she has received call from [facility] therapy department and has appointment at Lymphedema clinic on 4/29. Patient expressed thanks  for the assistance social worker Edwyna Shell has provided with getting assistance with co payment for therapy, that includes $ 300 from Cancer care and approved for $250 from Kelli Wilson to be paid directly to provider for co payments. Discussed with patient she will know a little more about needed supplies and length of therapy after initial visit.   Plan  Will plan return call to patient in the next week and continue to provide support and assessment of care needs.       THN CM Care Plan Problem One     Most Recent Value  Care Plan Problem One  Patient with concerns related to lymphedema and symptoms related to chemotherapy   Role Documenting the Problem One  Care Management [place] for Problem One  Active  [facility] Long Term Goal   Patient will report increase knowledge and have resources related to lymphedema in the next 31 days   THN Long Term Goal Start Date  03/04/18  Interventions for Problem One Long Term Goal   Reviewed with patient clinical state, reinforced notifying MD of unresolved nausea and diarrhea  THN CM Short Term Goal #1   Patient will report attending appointment at [facility] lymphedema outpatient in the next 21 days   THN CM Short Term Goal #1 Start Date  03/04/18  Interventions for Short Term Goal #1  Discussed with patient appointment scheduled and transportation   [facility] CM Short Term Goal #2   Patient will be able to report increased knowledge of signs of dehydration in the next 30 days    THN CM Short Term Goal #2 Start Date  03/04/18  [facility] CM Short Term Goal #2 Met Date  03/18/18  [facility] CM Short Term Goal #3  Patient will be able to report drinking at least 64 oz of fluid after chemotherapy in the next 30 days    THN CM Short Term Goal #3 Start Date  03/04/18  Interventions for Short Tern Goal #3  Discussed with patient recent intake and tolerance, encouraged  intake as tolerated, discussed if she has needed food resources in home.      Joylene Draft, RN, [place] Management Coordinator  [phone]- Mobile [phone]- Toll Free Main Office

## 2018-03-23 ENCOUNTER — Other Ambulatory Visit (HOSPITAL_COMMUNITY): Payer: Self-pay | Admitting: Oncology

## 2018-03-23 DIAGNOSIS — Z171 Estrogen receptor negative status [ER-]: Principal | ICD-10-CM

## 2018-03-23 DIAGNOSIS — C50412 Malignant neoplasm of upper-outer quadrant of left female breast: Secondary | ICD-10-CM

## 2018-03-24 ENCOUNTER — Inpatient Hospital Stay (HOSPITAL_COMMUNITY): Payer: Medicare Other

## 2018-03-24 ENCOUNTER — Encounter (HOSPITAL_COMMUNITY): Payer: Self-pay

## 2018-03-24 ENCOUNTER — Other Ambulatory Visit: Payer: Self-pay

## 2018-03-24 ENCOUNTER — Encounter: Payer: Self-pay | Admitting: General Practice

## 2018-03-24 VITALS — BP 130/66 | HR 100 | Temp 97.9°F | Resp 18 | Wt 269.4 lb

## 2018-03-24 DIAGNOSIS — C50412 Malignant neoplasm of upper-outer quadrant of left female breast: Secondary | ICD-10-CM

## 2018-03-24 DIAGNOSIS — Z5111 Encounter for antineoplastic chemotherapy: Secondary | ICD-10-CM | POA: Diagnosis not present

## 2018-03-24 DIAGNOSIS — C50919 Malignant neoplasm of unspecified site of unspecified female breast: Secondary | ICD-10-CM

## 2018-03-24 DIAGNOSIS — Z171 Estrogen receptor negative status [ER-]: Principal | ICD-10-CM

## 2018-03-24 LAB — CBC WITH DIFFERENTIAL/PLATELET
BASOS ABS: 0 10*3/uL (ref 0.0–0.1)
Basophils Relative: 1 %
EOS PCT: 4 %
Eosinophils Absolute: 0.2 10*3/uL (ref 0.0–0.7)
HEMATOCRIT: 35 % — AB (ref 36.0–46.0)
Hemoglobin: 11.2 g/dL — ABNORMAL LOW (ref 12.0–15.0)
LYMPHS ABS: 2.3 10*3/uL (ref 0.7–4.0)
LYMPHS PCT: 36 %
MCH: 29.7 pg (ref 26.0–34.0)
MCHC: 32 g/dL (ref 30.0–36.0)
MCV: 92.8 fL (ref 78.0–100.0)
Monocytes Absolute: 0.5 10*3/uL (ref 0.1–1.0)
Monocytes Relative: 8 %
NEUTROS ABS: 3.4 10*3/uL (ref 1.7–7.7)
Neutrophils Relative %: 51 %
PLATELETS: 272 10*3/uL (ref 150–400)
RBC: 3.77 MIL/uL — ABNORMAL LOW (ref 3.87–5.11)
RDW: 15.5 % (ref 11.5–15.5)
WBC: 6.5 10*3/uL (ref 4.0–10.5)

## 2018-03-24 LAB — COMPREHENSIVE METABOLIC PANEL
ALK PHOS: 68 U/L (ref 38–126)
ALT: 24 U/L (ref 14–54)
ANION GAP: 12 (ref 5–15)
AST: 29 U/L (ref 15–41)
Albumin: 3.6 g/dL (ref 3.5–5.0)
BUN: 10 mg/dL (ref 6–20)
CALCIUM: 8.9 mg/dL (ref 8.9–10.3)
CO2: 23 mmol/L (ref 22–32)
Chloride: 102 mmol/L (ref 101–111)
Creatinine, Ser: 1 mg/dL (ref 0.44–1.00)
GFR calc non Af Amer: 56 mL/min — ABNORMAL LOW (ref >60)
Glucose, Bld: 122 mg/dL — ABNORMAL HIGH (ref 65–99)
Potassium: 3.7 mmol/L (ref 3.5–5.1)
Sodium: 137 mmol/L (ref 135–145)
Total Bilirubin: 0.7 mg/dL (ref 0.3–1.2)
Total Protein: 6.7 g/dL (ref 6.5–8.1)

## 2018-03-24 LAB — LACTATE DEHYDROGENASE: LDH: 147 U/L (ref 98–192)

## 2018-03-24 MED ORDER — HEPARIN SOD (PORK) LOCK FLUSH 100 UNIT/ML IV SOLN
500.0000 [IU] | Freq: Once | INTRAVENOUS | Status: AC | PRN
  Administered 2018-03-24: 500 [IU]
  Filled 2018-03-24: qty 5

## 2018-03-24 MED ORDER — SODIUM CHLORIDE 0.9 % IV SOLN
Freq: Once | INTRAVENOUS | Status: DC
  Filled 2018-03-24: qty 4

## 2018-03-24 MED ORDER — FAMOTIDINE IN NACL 20-0.9 MG/50ML-% IV SOLN
20.0000 mg | Freq: Once | INTRAVENOUS | Status: AC
  Administered 2018-03-24: 20 mg via INTRAVENOUS
  Filled 2018-03-24: qty 50

## 2018-03-24 MED ORDER — DIPHENHYDRAMINE HCL 50 MG/ML IJ SOLN
50.0000 mg | Freq: Once | INTRAMUSCULAR | Status: AC
  Administered 2018-03-24: 50 mg via INTRAVENOUS
  Filled 2018-03-24: qty 1

## 2018-03-24 MED ORDER — DEXAMETHASONE SODIUM PHOSPHATE 100 MG/10ML IJ SOLN: 20.0000 mg | Freq: Once | INTRAMUSCULAR | Status: DC

## 2018-03-24 MED ORDER — SODIUM CHLORIDE 0.9 % IV SOLN
Freq: Once | INTRAVENOUS | Status: AC
  Administered 2018-03-24: 12:00:00 via INTRAVENOUS
  Filled 2018-03-24: qty 4

## 2018-03-24 MED ORDER — SODIUM CHLORIDE 0.9 % IV SOLN
Freq: Once | INTRAVENOUS | Status: AC
  Administered 2018-03-24: 12:00:00 via INTRAVENOUS

## 2018-03-24 MED ORDER — SODIUM CHLORIDE 0.9 % IV SOLN
53.3333 mg/m2 | Freq: Once | INTRAVENOUS | Status: AC
  Administered 2018-03-24: 132 mg via INTRAVENOUS
  Filled 2018-03-24: qty 22

## 2018-03-24 NOTE — Progress Notes (Signed)
Treatment given per orders. Patient tolerated it well without problems. Vitals stable and discharged home from clinic ambulatory. Follow up as scheduled.  

## 2018-03-24 NOTE — Patient Instructions (Signed)
[facility] Discharge Instructions for Patients Receiving Chemotherapy   Beginning January 23rd 2017 lab work for the Cancer Center will be done in the  Main lab at [place] on 1st floor. If you have a lab appointment with the Cancer Center please come in thru the  Main Entrance and check in at the main information desk   Today you received the following chemotherapy agents   To help prevent nausea and vomiting after your treatment, we encourage you to take your nausea medication     If you develop nausea and vomiting, or diarrhea that is not controlled by your medication, call the clinic.  The clinic phone number is [phone]. Office hours are Monday-Friday 8:30am-5:00pm.  BELOW ARE SYMPTOMS THAT SHOULD BE REPORTED IMMEDIATELY:  *FEVER GREATER THAN 101.0 F  *CHILLS WITH OR WITHOUT FEVER  NAUSEA AND VOMITING THAT IS NOT CONTROLLED WITH YOUR NAUSEA MEDICATION  *UNUSUAL SHORTNESS OF BREATH  *UNUSUAL BRUISING OR BLEEDING  TENDERNESS IN MOUTH AND THROAT WITH OR WITHOUT PRESENCE OF ULCERS  *URINARY PROBLEMS  *BOWEL PROBLEMS  UNUSUAL RASH Items with * indicate a potential emergency and should be followed up as soon as possible. If you have an emergency after office hours please contact your primary care physician or go to the nearest emergency department.  Please call the clinic during office hours if you have any questions or concerns.   You may also contact the Patient Navigator at [phone] should you have any questions or need assistance in obtaining follow up care.      Resources For Cancer Patients and their Caregivers ? American Cancer Society: Can assist with transportation, wigs, general needs, runs Look Good Feel Better.        1-888-227-6333 ? Cancer Care: Provides financial assistance, online support groups, medication/co-pay assistance.  1-800-813-HOPE (4673) ? [facility] Assists [place] cancer  patients and their families through emotional , educational and financial support.  336-427-4357 ? [place] DSS Where to apply for food stamps, Medicaid and utility assistance. 336-342-1394 ? RCATS: Transportation to medical appointments. 336-347-2287 ? Social Security Administration: May apply for disability if have a Stage IV cancer. 336-342-7796 1-800-772-1213 ? [place] Aging, Disability and Transit Services: Assists with nutrition, care and transit needs. 336-349-2343         

## 2018-03-24 NOTE — Progress Notes (Addendum)
[facility] CSW Progress Notes  Met w patient in infusion room, patient is receiving last of 16 rounds of chemotherapy.  Has been seen at [facility] for lymphedema treatment.  Occupational therapist, Berneta Sages, has recommended both a lymphedema sleeve and vest. Spoke w Westley Hummer, OT at [facility] that they have submitted request for pump/vest to insurance approval.  Sleeve is not covered by insurance.  They will try to fit patient in "off the shelf" sleeve as facility has access to multiple brands and lengths of sleeves.  Cost will depend on custom vs off the shelf and other details.  Facility will attempt to get patient needs covered under their charity care arrangements; Staff messaged Lendell Caprice, financial advocate at [facility], and Joylene Draft, [facility] RN, to update them.  Patient reports she is significantly fatigued, often needs to rest in bed.  Reports that she is finding some items to eat, but many foods upset her stomach.  Has attended community support meal sponsored by [employer] and appreciated the support.  Given May calendar of events, encouraged to attend Support Group.    Kelli Shell, LCSW Clinical Social Worker Phone:  [phone]

## 2018-03-25 ENCOUNTER — Other Ambulatory Visit: Payer: Self-pay | Admitting: Licensed Clinical Social Worker

## 2018-03-25 NOTE — Patient Outreach (Signed)
Assessment:  CSW spoke via phone with client  CSW verified client identity. CSW received verbal permission from client for CSW to speak with client about client needs.Client recently completed her last Chemotherapy treatment. She has expressed that she is fatigued and sometimes food makes her feel nauseous. She has gone to her first Lymphedema treatment in [place], [place] on 03/23/18. She is appreciative of help received from Edwyna Shell regarding resources to help meet financial needs of her current treatments.  She has received support check from [place] and has been approved for Co[employer] grant  From [employer]. CSW spoke with Blanch Media about client care plan. CSW encouraged client to speak with CSW in next 30 days about financial resources for client in the community. CSW has spoken with client about two local food pantries which may be able to provide some food assistance to client.  While client does sometimes drive herself to local appointments, CSW also encouraged [place] to consider [facility] to help meet some of client's transport needs.Client said she goes to several food pantries in the area.  She said she has her second appointment at the lymphedema clinic in [place], [place] on 03/26/18.  She will see her primary doctor, Dr. Cindie Laroche on Apr 10, 2018. CSW spoke with client about [place] support of client with RN Landis Martins. Client said she has reduced family support but has a neighbor who checks in on her regularly  to make sure client is doing well. Client said her family members do not live locally. Client said she received some food items from local food pantry. Client said she received gift card to help with expenses. Client said that [employer] brought her a gift box of food.  CSW thanked client for phone call with CSW on 03/25/18.  Client is appreciative of [facility] program support.     Plan:  Client to continue speak with CSW in next 30 days about financial  resources for client in the community.   CSW to collaborate with RN Landis Martins in monitoring needs of client    CSW to call client in 4 weeks to assess client needs at that time.   Norva Riffle.Erik Burkett MSW, LCSW Licensed Clinical Social Worker [facility] Care Management [phone]

## 2018-03-27 ENCOUNTER — Other Ambulatory Visit: Payer: Self-pay | Admitting: *Deleted

## 2018-03-27 NOTE — Patient Outreach (Addendum)
[facility]) Care Management  03/27/2018  Kelli Wilson 1951/05/06 [id number]   Referral received: 02/20/18 Referral source; [facility], Dr.Higgs Referral reason : Multiple psychosocial issues , financial concerns, transportation concerns, access to lymphedema treatment,  Insurance: [employer]  PMHx includes but not limited to Left breast cancer and mastectomy,  Diabetes, GERD.  Placed outreach call to patient , no answer able to leave a HIPAA compliant message for return call.   Received return call from patient reports she had been on an errand earlier and now she is tired.  Patient discussed her having  last chemo treatment on this week and she is thankful and feeling better as no diarrhea in the last week. Her appetite is a little slow coming back, just has a taste for certain foods, lately eating beans an cornbread, likes more of the country foods.  Patient reports tolerating drinking fluids well .  Patient reports monitoring her blood sugar most daily, reading yesterday 110, reports feeling a little weak this morning and had to eat a cupcake, but she hasn't checked her blood sugar yet. Patient again discussed she is aware of low blood sugar symptoms and how to treat due to caring for her child that had Diabetes type 1. Patient discussed having her 1st lymphedema therapy on yesterday and things went pretty well. , she will attend 2 days a week Monday and Wednesday. Patient discussed she has a total of $550 dollars toward treatments and unsure at this time how long she will have to go . Patient states they are hopeful that lymphedema vest will be covered by insurance and she will be able to get vest and sleeve through the clinic in [place]. She states her vest my have a pump on it.  She discussed staff at center is very caring.  Patient discussed recent social outing to dinner sponsored by [employer] that she attended, she states she will not be able to  attend support group meeting coming up due to her treatment schedule time. Patient is considering attending cancer support group in [place].   Upcoming appointment  Patient has visit with oncology on 5/13 and follow up visit with PCP on 5/17, she will be able to drive to visits.  Plan  Will plan return call in the next 2 weeks and assess for additional care needs.   Reinforced rule of 15 for hypoglycemia.treatment.   Joylene Draft, RN, [place] Management Coordinator  [phone]- Mobile [phone]- Toll Free Main Office

## 2018-03-30 ENCOUNTER — Encounter (HOSPITAL_COMMUNITY): Payer: Self-pay | Admitting: Emergency Medicine

## 2018-03-30 ENCOUNTER — Other Ambulatory Visit: Payer: Self-pay

## 2018-03-30 ENCOUNTER — Emergency Department (HOSPITAL_COMMUNITY): Payer: Medicare Other

## 2018-03-30 ENCOUNTER — Emergency Department (HOSPITAL_COMMUNITY)
Admission: EM | Admit: 2018-03-30 | Discharge: 2018-03-30 | Discharge status: Against Medical Advice | Payer: Medicare Other | Source: Home / Self Care | Attending: Emergency Medicine | Admitting: Emergency Medicine

## 2018-03-30 ENCOUNTER — Inpatient Hospital Stay (HOSPITAL_COMMUNITY)
Admission: EM | Admit: 2018-03-30 | Discharge: 2018-04-02 | DRG: 309 | Disposition: A | Discharge status: Home / Self Care | Payer: Medicare Other | Attending: Family Medicine | Admitting: Family Medicine

## 2018-03-30 DIAGNOSIS — J449 Chronic obstructive pulmonary disease, unspecified: Secondary | ICD-10-CM | POA: Diagnosis present

## 2018-03-30 DIAGNOSIS — M797 Fibromyalgia: Secondary | ICD-10-CM | POA: Diagnosis present

## 2018-03-30 DIAGNOSIS — R7989 Other specified abnormal findings of blood chemistry: Secondary | ICD-10-CM | POA: Diagnosis present

## 2018-03-30 DIAGNOSIS — C50412 Malignant neoplasm of upper-outer quadrant of left female breast: Secondary | ICD-10-CM | POA: Diagnosis present

## 2018-03-30 DIAGNOSIS — E876 Hypokalemia: Secondary | ICD-10-CM | POA: Diagnosis present

## 2018-03-30 DIAGNOSIS — I951 Orthostatic hypotension: Secondary | ICD-10-CM | POA: Insufficient documentation

## 2018-03-30 DIAGNOSIS — Z7984 Long term (current) use of oral hypoglycemic drugs: Secondary | ICD-10-CM

## 2018-03-30 DIAGNOSIS — K2100 Gastro-esophageal reflux disease with esophagitis, without bleeding: Secondary | ICD-10-CM

## 2018-03-30 DIAGNOSIS — F17290 Nicotine dependence, other tobacco product, uncomplicated: Secondary | ICD-10-CM | POA: Diagnosis present

## 2018-03-30 DIAGNOSIS — F32A Depression, unspecified: Secondary | ICD-10-CM | POA: Diagnosis present

## 2018-03-30 DIAGNOSIS — E119 Type 2 diabetes mellitus without complications: Secondary | ICD-10-CM | POA: Diagnosis present

## 2018-03-30 DIAGNOSIS — G894 Chronic pain syndrome: Secondary | ICD-10-CM | POA: Diagnosis present

## 2018-03-30 DIAGNOSIS — F329 Major depressive disorder, single episode, unspecified: Secondary | ICD-10-CM | POA: Diagnosis present

## 2018-03-30 DIAGNOSIS — E7800 Pure hypercholesterolemia, unspecified: Secondary | ICD-10-CM | POA: Diagnosis present

## 2018-03-30 DIAGNOSIS — Z79899 Other long term (current) drug therapy: Secondary | ICD-10-CM | POA: Insufficient documentation

## 2018-03-30 DIAGNOSIS — E669 Obesity, unspecified: Secondary | ICD-10-CM | POA: Diagnosis present

## 2018-03-30 DIAGNOSIS — K219 Gastro-esophageal reflux disease without esophagitis: Secondary | ICD-10-CM | POA: Diagnosis present

## 2018-03-30 DIAGNOSIS — Z171 Estrogen receptor negative status [ER-]: Secondary | ICD-10-CM | POA: Diagnosis not present

## 2018-03-30 DIAGNOSIS — F320 Major depressive disorder, single episode, mild: Secondary | ICD-10-CM

## 2018-03-30 DIAGNOSIS — R531 Weakness: Secondary | ICD-10-CM

## 2018-03-30 DIAGNOSIS — Z9012 Acquired absence of left breast and nipple: Secondary | ICD-10-CM

## 2018-03-30 DIAGNOSIS — Z90710 Acquired absence of both cervix and uterus: Secondary | ICD-10-CM

## 2018-03-30 DIAGNOSIS — I479 Paroxysmal tachycardia, unspecified: Secondary | ICD-10-CM | POA: Insufficient documentation

## 2018-03-30 DIAGNOSIS — Z91041 Radiographic dye allergy status: Secondary | ICD-10-CM | POA: Diagnosis not present

## 2018-03-30 DIAGNOSIS — Z833 Family history of diabetes mellitus: Secondary | ICD-10-CM

## 2018-03-30 DIAGNOSIS — Z853 Personal history of malignant neoplasm of breast: Secondary | ICD-10-CM

## 2018-03-30 DIAGNOSIS — F419 Anxiety disorder, unspecified: Secondary | ICD-10-CM | POA: Diagnosis present

## 2018-03-30 DIAGNOSIS — Z825 Family history of asthma and other chronic lower respiratory diseases: Secondary | ICD-10-CM

## 2018-03-30 DIAGNOSIS — I10 Essential (primary) hypertension: Secondary | ICD-10-CM

## 2018-03-30 DIAGNOSIS — I471 Supraventricular tachycardia: Principal | ICD-10-CM | POA: Diagnosis present

## 2018-03-30 DIAGNOSIS — Z9221 Personal history of antineoplastic chemotherapy: Secondary | ICD-10-CM | POA: Diagnosis not present

## 2018-03-30 DIAGNOSIS — E860 Dehydration: Secondary | ICD-10-CM | POA: Diagnosis present

## 2018-03-30 DIAGNOSIS — Z6841 Body Mass Index (BMI) 40.0 and over, adult: Secondary | ICD-10-CM

## 2018-03-30 DIAGNOSIS — Z91040 Latex allergy status: Secondary | ICD-10-CM | POA: Insufficient documentation

## 2018-03-30 DIAGNOSIS — Z8249 Family history of ischemic heart disease and other diseases of the circulatory system: Secondary | ICD-10-CM | POA: Diagnosis not present

## 2018-03-30 DIAGNOSIS — C50012 Malignant neoplasm of nipple and areola, left female breast: Secondary | ICD-10-CM

## 2018-03-30 LAB — URINALYSIS, ROUTINE W REFLEX MICROSCOPIC
Bacteria, UA: NONE SEEN
Bilirubin Urine: NEGATIVE
GLUCOSE, UA: 50 mg/dL — AB
HGB URINE DIPSTICK: NEGATIVE
Ketones, ur: NEGATIVE mg/dL
NITRITE: NEGATIVE
Protein, ur: NEGATIVE mg/dL
SPECIFIC GRAVITY, URINE: 1.024 (ref 1.005–1.030)
pH: 5 (ref 5.0–8.0)

## 2018-03-30 LAB — COMPREHENSIVE METABOLIC PANEL
ALK PHOS: 72 U/L (ref 38–126)
ALT: 23 U/L (ref 14–54)
ANION GAP: 10 (ref 5–15)
AST: 25 U/L (ref 15–41)
Albumin: 3.6 g/dL (ref 3.5–5.0)
BUN: 13 mg/dL (ref 6–20)
CALCIUM: 9.1 mg/dL (ref 8.9–10.3)
CO2: 23 mmol/L (ref 22–32)
CREATININE: 0.85 mg/dL (ref 0.44–1.00)
Chloride: 106 mmol/L (ref 101–111)
Glucose, Bld: 167 mg/dL — ABNORMAL HIGH (ref 65–99)
Potassium: 3.5 mmol/L (ref 3.5–5.1)
Sodium: 139 mmol/L (ref 135–145)
TOTAL PROTEIN: 6.7 g/dL (ref 6.5–8.1)
Total Bilirubin: 0.5 mg/dL (ref 0.3–1.2)

## 2018-03-30 LAB — CBC WITH DIFFERENTIAL/PLATELET
BASOS ABS: 0.1 10*3/uL (ref 0.0–0.1)
BASOS PCT: 1 %
EOS ABS: 0.2 10*3/uL (ref 0.0–0.7)
Eosinophils Relative: 4 %
HCT: 33.8 % — ABNORMAL LOW (ref 36.0–46.0)
HEMOGLOBIN: 11.2 g/dL — AB (ref 12.0–15.0)
Lymphocytes Relative: 30 %
Lymphs Abs: 1.7 10*3/uL (ref 0.7–4.0)
MCH: 30.5 pg (ref 26.0–34.0)
MCHC: 33.1 g/dL (ref 30.0–36.0)
MCV: 92.1 fL (ref 78.0–100.0)
Monocytes Absolute: 0.4 10*3/uL (ref 0.1–1.0)
Monocytes Relative: 8 %
NEUTROS ABS: 3.1 10*3/uL (ref 1.7–7.7)
NEUTROS PCT: 57 %
PLATELETS: 269 10*3/uL (ref 150–400)
RBC: 3.67 MIL/uL — AB (ref 3.87–5.11)
RDW: 14.9 % (ref 11.5–15.5)
WBC: 5.5 10*3/uL (ref 4.0–10.5)

## 2018-03-30 LAB — TROPONIN I

## 2018-03-30 LAB — LACTIC ACID, PLASMA: Lactic Acid, Venous: 2.3 mmol/L (ref 0.5–1.9)

## 2018-03-30 MED ORDER — ENOXAPARIN SODIUM 60 MG/0.6ML ~~LOC~~ SOLN
0.5000 mg/kg | SUBCUTANEOUS | Status: DC
  Administered 2018-03-30 – 2018-04-01 (×3): 60 mg via SUBCUTANEOUS
  Filled 2018-03-30 (×3): qty 0.6

## 2018-03-30 MED ORDER — PRAVASTATIN SODIUM 40 MG PO TABS
40.0000 mg | ORAL_TABLET | Freq: Every day | ORAL | Status: DC
  Administered 2018-03-31 – 2018-04-02 (×3): 40 mg via ORAL
  Filled 2018-03-30 (×3): qty 1

## 2018-03-30 MED ORDER — INSULIN ASPART 100 UNIT/ML ~~LOC~~ SOLN: 0.0000 [IU] | Freq: Three times a day (TID) | SUBCUTANEOUS | Status: DC

## 2018-03-30 MED ORDER — SODIUM CHLORIDE 0.9 % IV SOLN
INTRAVENOUS | Status: DC
  Administered 2018-03-30 – 2018-03-31 (×2): via INTRAVENOUS
  Administered 2018-03-31: 1000 mL via INTRAVENOUS
  Administered 2018-04-01 – 2018-04-02 (×4): via INTRAVENOUS

## 2018-03-30 MED ORDER — ALBUTEROL SULFATE (2.5 MG/3ML) 0.083% IN NEBU: 2.5000 mg | INHALATION_SOLUTION | Freq: Four times a day (QID) | RESPIRATORY_TRACT | Status: DC | PRN

## 2018-03-30 MED ORDER — SODIUM CHLORIDE 0.9 % IV SOLN: INTRAVENOUS | Status: DC

## 2018-03-30 MED ORDER — HYDROCODONE-ACETAMINOPHEN 5-325 MG PO TABS
1.0000 | ORAL_TABLET | Freq: Four times a day (QID) | ORAL | Status: DC | PRN
  Administered 2018-03-30 – 2018-04-02 (×5): 1 via ORAL
  Filled 2018-03-30 (×5): qty 1

## 2018-03-30 MED ORDER — DILTIAZEM HCL-DEXTROSE 100-5 MG/100ML-% IV SOLN (PREMIX)
5.0000 mg/h | INTRAVENOUS | Status: DC
  Administered 2018-03-30: 10 mg/h via INTRAVENOUS
  Administered 2018-03-31: 5 mg/h via INTRAVENOUS
  Filled 2018-03-30: qty 100

## 2018-03-30 MED ORDER — ONDANSETRON HCL 4 MG PO TABS: 4.0000 mg | ORAL_TABLET | Freq: Four times a day (QID) | ORAL | Status: DC | PRN

## 2018-03-30 MED ORDER — MAGNESIUM OXIDE 400 (241.3 MG) MG PO TABS
400.0000 mg | ORAL_TABLET | Freq: Two times a day (BID) | ORAL | Status: DC
Start: 2018-03-30 — End: 2018-04-01
  Administered 2018-03-31 – 2018-04-01 (×3): 400 mg via ORAL
  Filled 2018-03-30 (×3): qty 1

## 2018-03-30 MED ORDER — SODIUM CHLORIDE 0.9 % IV BOLUS
500.0000 mL | Freq: Once | INTRAVENOUS | Status: AC
  Administered 2018-03-30: 500 mL via INTRAVENOUS

## 2018-03-30 MED ORDER — DILTIAZEM HCL-DEXTROSE 100-5 MG/100ML-% IV SOLN (PREMIX)
5.0000 mg/h | Freq: Once | INTRAVENOUS | Status: AC
  Administered 2018-03-30: 5 mg/h via INTRAVENOUS
  Filled 2018-03-30: qty 100

## 2018-03-30 MED ORDER — PANTOPRAZOLE SODIUM 40 MG PO TBEC
40.0000 mg | DELAYED_RELEASE_TABLET | Freq: Every day | ORAL | Status: DC
  Administered 2018-03-30 – 2018-04-01 (×3): 40 mg via ORAL
  Filled 2018-03-30 (×3): qty 1

## 2018-03-30 MED ORDER — ALPRAZOLAM 1 MG PO TABS
1.0000 mg | ORAL_TABLET | Freq: Three times a day (TID) | ORAL | Status: DC | PRN
  Administered 2018-03-30 – 2018-04-02 (×4): 1 mg via ORAL
  Filled 2018-03-30: qty 2
  Filled 2018-03-30 (×2): qty 1
  Filled 2018-03-30: qty 2

## 2018-03-30 MED ORDER — ONDANSETRON HCL 4 MG/2ML IJ SOLN: 4.0000 mg | Freq: Four times a day (QID) | INTRAMUSCULAR | Status: DC | PRN

## 2018-03-30 MED ORDER — VITAMIN D 1000 UNITS PO TABS
5000.0000 [IU] | ORAL_TABLET | Freq: Every day | ORAL | Status: DC
  Administered 2018-03-31 – 2018-04-02 (×3): 5000 [IU] via ORAL
  Filled 2018-03-30 (×3): qty 5

## 2018-03-30 MED ORDER — SODIUM CHLORIDE 0.9 % IV BOLUS (SEPSIS)
1000.0000 mL | Freq: Once | INTRAVENOUS | Status: AC
  Administered 2018-03-30: 1000 mL via INTRAVENOUS

## 2018-03-30 NOTE — ED Notes (Signed)
Notified NT Angie to do EKG on patient in room 5.

## 2018-03-30 NOTE — ED Notes (Signed)
Notified Dr. Lacinda Axon about patient's high HR

## 2018-03-30 NOTE — ED Triage Notes (Signed)
Pt states her heart is racing and her heart rate was 170s at home.

## 2018-03-30 NOTE — ED Notes (Signed)
PT called out and stated she is going to leave the ED and go home. EDP made aware that pt wants to leave.

## 2018-03-30 NOTE — ED Notes (Signed)
PT left AMA and verbalized understanding of risks and/or even possible death if she left the hospital today. PT alert and oriented and told the EDP and nursing staff she would come back to the ED if she felt worse.

## 2018-03-30 NOTE — ED Provider Notes (Signed)
[facility] EMERGENCY DEPARTMENT Provider Note   CSN: [id number] Arrival date & time: 03/30/18  1334     History   Chief Complaint Chief Complaint  Patient presents with  . Weakness    HPI Kelli Wilson is a 67 y.o. female.  HPI  Pt was seen at 1445. Per pt, c/o gradual onset and worsening of persistent generalized weakness/fatigue that began 3 days ago. Has been associated with "chills," home temp to "99.4," nausea, diarrhea, cough, poor PO intake. Pt states she "was too weak to even stand up" today. Pt endorses LD chemo 6 days ago. Denies vomiting, no abd pain, no CP/palpitations, no SOB, no fevers, no rash, no focal motor weakness, no tingling/numbness in extremities, no near syncope/syncope.   Past Medical History:  Diagnosis Date  . Anxiety   . Arthritis   . Back pain   . Byssinosis ([place])    secondary to working in a [employer]  . Chest pain   . Chronic pain   . Depression   . Diabetes mellitus without complication ([place])   . Fibromyalgia   . GERD (gastroesophageal reflux disease)   . Hypercholesteremia   . Hypertension   . Knee pain   . Obesity   . Peptic ulcer    history of - normal GI studies Sept 2010    Patient Active Problem List   Diagnosis Date Noted  . Breast cancer, left breast ([place]) 09/15/2017  . Malignant neoplasm of upper-outer quadrant of left female breast ([place])   . Hypercholesteremia   . Hypertension   . Anxiety   . Depression   . Fibromyalgia   . GERD (gastroesophageal reflux disease)   . Byssinosis [facility])     Past Surgical History:  Procedure Laterality Date  . APPENDECTOMY    . bladder rectal repair    . BREAST SURGERY Left    breast biopsy- benign  . CHOLECYSTECTOMY    . DILATION AND CURETTAGE OF UTERUS    . GANGLION CYST EXCISION Left   . MASTECTOMY MODIFIED RADICAL Left 09/15/2017   Procedure: LEFT MODIFIED RADICAL MASTECTOMY;  Surgeon: Aviva Signs, MD;  Location: [facility];  Service: General;  Laterality: Left;  .  PORTACATH PLACEMENT Right 10/13/2017   Procedure: INSERTION PORT-A-CATH RIGHT SUBCLAVIAN;  Surgeon: Aviva Signs, MD;  Location: [facility];  Service: General;  Laterality: Right;  . TOTAL ABDOMINAL HYSTERECTOMY       OB History   None      Home Medications    Prior to Admission medications   Medication Sig Start Date End Date Taking? Authorizing Provider  ALPRAZolam Duanne Moron) 1 MG tablet Take 5 mg by mouth 3 (three) times daily as needed for anxiety.    Yes [provider]  Camphor-Eucalyptus-Menthol (VICKS VAPORUB EX) Apply 1 application daily as needed topically (congestion).   Yes [provider]  Cholecalciferol (VITAMIN D3) 5000 units CAPS Take 5,000 Units by mouth daily.   Yes [provider]  dapagliflozin propanediol (FARXIGA) 5 MG TABS tablet Take 5 mg by mouth daily. Only takes on days she takes steroids.   Yes [provider]  Diphenhyd-Hydrocort-Nystatin (FIRST-DUKES MOUTHWASH) SUSP Swish and swallow 5 mLs no more than every 4 hours. 12/23/17  Yes Higgs, Mathis Dad, MD  diphenoxylate-atropine (LOMOTIL) 2.5-0.025 MG tablet Take 1 tablet by mouth 4 (four) times daily as needed for diarrhea or loose stools. 01/07/18  Yes Holley Bouche, NP  HYDROcodone-acetaminophen (NORCO/VICODIN) 5-325 MG tablet Take 1 tablet by mouth every  4 (four) hours as needed.  11/07/17  Yes [provider]  hydrOXYzine (ATARAX/VISTARIL) 50 MG tablet Take 50 mg by mouth at bedtime.    Yes [provider]  Lidocaine 5 % CREA Apply 1 application topically as needed (for pain).   Yes [provider]  magnesium 30 MG tablet Take 60 mg by mouth 2 (two) times daily.    Yes [provider]  metFORMIN (GLUCOPHAGE) 500 MG tablet Take 500 mg by mouth 2 (two) times daily with a meal.    Yes [provider]  omeprazole (PRILOSEC) 40 MG capsule Take 40 mg every other day by mouth. At night   Yes [provider]  Potassium 99 MG TABS Take  2 tablets by mouth daily.   Yes [provider]  pravastatin (PRAVACHOL) 40 MG tablet Take 40 mg by mouth daily.   Yes [provider]  promethazine (PHENERGAN) 25 MG tablet Take 25 mg by mouth every 6 (six) hours as needed for nausea or vomiting.   Yes [provider]  Pseudoeph-Doxylamine-DM-APAP (NYQUIL PO) Take 2 tablets at bedtime as needed by mouth (sinus headaches).   Yes [provider]  albuterol (PROVENTIL) (2.5 MG/3ML) 0.083% nebulizer solution Take 2.5 mg by nebulization every 6 (six) hours as needed for wheezing or shortness of breath.     [provider]  nitroGLYCERIN (NITROSTAT) 0.4 MG SL tablet Place 0.4 mg under the tongue every 5 (five) minutes as needed for chest pain.    [provider]    Family History Family History  Problem Relation Age of Onset  . Aneurysm Father   . Heart disease Father   . COPD Mother   . Diabetes Mother     Social History Social History   Tobacco Use  . Smoking status: Never Smoker  . Smokeless tobacco: Current User    Types: Snuff  Substance Use Topics  . Alcohol use: No  . Drug use: No     Allergies   Magnesium sulfate; Other; Cortisone; Iohexol; Ivp dye [iodinated diagnostic agents]; Januvia [sitagliptin]; Latex; Motrin [ibuprofen]; Nsaids; Statins; Sulfa antibiotics; and Adhesive [tape]   Review of Systems Review of Systems ROS: Statement: All systems negative except as marked or noted in the HPI; Constitutional: Negative for fever and +chills, generalized weakness/fatigue. ; ; Eyes: Negative for eye pain, redness and discharge. ; ; ENMT: Negative for ear pain, hoarseness, nasal congestion, sinus pressure and sore throat. ; ; Cardiovascular: Negative for chest pain, palpitations, diaphoresis, dyspnea and peripheral edema. ; ; Respiratory: +cough. Negative for wheezing and stridor. ; ; Gastrointestinal: +nausea, diarrhea, poor PO intake. Negative for vomiting, abdominal pain,  blood in stool, hematemesis, jaundice and rectal bleeding. . ; ; Genitourinary: Negative for dysuria, flank pain and hematuria. ; ; Musculoskeletal: Negative for back pain and neck pain. Negative for swelling and trauma.; ; Skin: Negative for pruritus, rash, abrasions, blisters, bruising and skin lesion.; ; Neuro: Negative for headache, lightheadedness and neck stiffness. Negative for altered level of consciousness, altered mental status, extremity weakness, paresthesias, involuntary movement, seizure and syncope.      Physical Exam Updated Vital Signs BP 112/68 (BP Location: Right Arm)   Pulse (!) 107   Temp 97.7 F (36.5 C) (Oral)   Resp (!) 22   Wt 122 kg (269 lb)   SpO2 96%   BMI 41.51 kg/m    Patient Vitals for the past 24 hrs:  BP Temp Temp src Pulse Resp SpO2 Weight  03/30/18 1700 119/87 - - (!) 112 20 95 % -  03/30/18 1630 126/74 - - (!) 110 (!) 33 95 % -  03/30/18 1600 128/79 - - (!) 110 (!) 21 96 % -  03/30/18 1530 125/68 - - (!) 103 (!) 24 99 % -  03/30/18 1500 107/65 - - (!) 107 (!) 22 98 % -  03/30/18 1449 112/68 - - (!) 107 (!) 22 96 % -  03/30/18 1430 - - - 100 (!) 23 97 % -  03/30/18 1415 - - - (!) 110 (!) 22 98 % -  03/30/18 1400 (!) 150/82 - - (!) 143 19 94 % -  03/30/18 1341 - - - - - - 122 kg (269 lb)  03/30/18 1340 (!) 151/97 97.7 F (36.5 C) Oral (!) 160 15 96 % -     14:59 Orthostatic Vital Signs FS  Orthostatic Lying   BP- Lying: 114/73   Pulse- Lying: 108       Orthostatic Sitting  BP- Sitting: 107/65   Pulse- Sitting: 119       Orthostatic Standing at 0 minutes  BP- Standing at 0 minutes: 100/67   Pulse- Standing at 0 minutes: 131       Physical Exam 1450: Physical examination:  Nursing notes reviewed; Vital signs and O2 SAT reviewed;  Constitutional: Well developed, Well nourished, In no acute distress; Head:  Normocephalic, atraumatic; Eyes: EOMI, PERRL, No scleral icterus; ENMT: Mouth and pharynx normal, Mucous membranes dry; Neck:  Supple, Full range of motion, No lymphadenopathy; Cardiovascular: Tachycardic rate and rhythm, No gallop; Respiratory: Breath sounds clear & equal bilaterally, No wheezes. +non-productive cough during exam. Speaking full sentences with ease, Normal respiratory effort/excursion; Chest: Nontender, Movement normal; Abdomen: Soft, Nontender, Nondistended, Normal bowel sounds; Genitourinary: No CVA tenderness; Extremities: Peripheral pulses normal, No tenderness, No edema, No calf edema or asymmetry.; Neuro: AA&Ox3, Major CN grossly intact. No facial droop. Speech clear. No gross focal motor or sensory deficits in extremities.; Skin: Color pale, Warm, Dry.   ED Treatments / Results  Labs (all labs ordered are listed, but only abnormal results are displayed)   EKG EKG Interpretation  Date/Time:  Monday Mar 30 2018 13:53:27 EDT Ventricular Rate:  115 PR Interval:    QRS Duration: 92 QT Interval:  319 QTC Calculation: 442 R Axis:   53 Text Interpretation:  Sinus tachycardia Atrial premature complex Borderline T abnormalities, inferior leads When compared with ECG of 11/21/2017 and 09/10/2017 Rate faster Confirmed by Francine Graven [phone]) on 03/30/2018 3:44:08 PM    EKG Interpretation  Date/Time:  Monday Mar 30 2018 15:03:10 EDT Ventricular Rate:  121 PR Interval:    QRS Duration: 92 QT Interval:  307 QTC Calculation: 436 R Axis:   55 Text Interpretation:  Sinus tachycardia with irregular rate Borderline repolarization abnormality Since last tracing of earlier today run of narrow complex tachycardia is now present Confirmed by Francine Graven [phone]) on 03/30/2018 3:45:39 PM       EKG Interpretation  Date/Time:  Monday Mar 30 2018 17:19:20 EDT Ventricular Rate:  159 PR Interval:    QRS Duration: 87 QT Interval:  303 QTC Calculation: 493 R Axis:   66 Text Interpretation:  Supraventricular tachycardia Ventricular premature complex Repolarization abnormality, prob rate related Since  last tracing of earlier today Supraventricular tachycardia is now Present Confirmed by Francine Graven [phone]) on 03/30/2018 5:26:44 PM         Radiology   Procedures Procedures (including critical care time)  Medications Ordered in ED Medications  sodium chloride 0.9 % bolus 1,000 mL (has no administration in time range)     Initial Impression / Assessment and Plan / ED Course  I have reviewed the triage vital signs and the nursing notes.  Pertinent labs & imaging results that were available during my care of the patient were reviewed by me and considered in my medical decision making (see chart for details).  MDM Reviewed: previous chart, nursing note and vitals Reviewed previous: labs and ECG Interpretation: labs, ECG and x-ray   Results for orders placed or performed during the hospital encounter of 03/30/18  Comprehensive metabolic panel  Result Value Ref Range   Sodium 139 135 - 145 mmol/L   Potassium 3.5 3.5 - 5.1 mmol/L   Chloride 106 101 - 111 mmol/L   CO2 23 22 - 32 mmol/L   Glucose, Bld 167 (H) 65 - 99 mg/dL   BUN 13 6 - 20 mg/dL   Creatinine, Ser 0.85 0.44 - 1.00 mg/dL   Calcium 9.1 8.9 - 10.3 mg/dL   Total Protein 6.7 6.5 - 8.1 g/dL   Albumin 3.6 3.5 - 5.0 g/dL   AST 25 15 - 41 U/L   ALT 23 14 - 54 U/L   Alkaline Phosphatase 72 38 - 126 U/L   Total Bilirubin 0.5 0.3 - 1.2 mg/dL   GFR calc non Af Amer >60 >60 mL/min   GFR calc Af Amer >60 >60 mL/min   Anion gap 10 5 - 15  CBC with Differential  Result Value Ref Range   WBC 5.5 4.0 - 10.5 K/uL   RBC 3.67 (L) 3.87 - 5.11 MIL/uL   Hemoglobin 11.2 (L) 12.0 - 15.0 g/dL   HCT 33.8 (L) 36.0 - 46.0 %   MCV 92.1 78.0 - 100.0 fL   MCH 30.5 26.0 - 34.0 pg   MCHC 33.1 30.0 - 36.0 g/dL   RDW 14.9 11.5 - 15.5 %   Platelets 269 150 - 400 K/uL   Neutrophils Relative % 57 %   Neutro Abs 3.1 1.7 - 7.7 K/uL   Lymphocytes Relative 30 %   Lymphs Abs 1.7 0.7 - 4.0 K/uL   Monocytes Relative 8 %   Monocytes  Absolute 0.4 0.1 - 1.0 K/uL   Eosinophils Relative 4 %   Eosinophils Absolute 0.2 0.0 - 0.7 K/uL   Basophils Relative 1 %   Basophils Absolute 0.1 0.0 - 0.1 K/uL  Troponin I  Result Value Ref Range   Troponin I <0.03 <0.03 ng/mL  Lactic acid, plasma  Result Value Ref Range   Lactic Acid, Venous 2.3 (HH) 0.5 - 1.9 mmol/L  Urinalysis, Routine w reflex microscopic  Result Value Ref Range   Color, Urine YELLOW YELLOW   APPearance HAZY (A) CLEAR   Specific Gravity, Urine 1.024 1.005 - 1.030   pH 5.0 5.0 - 8.0   Glucose, UA 50 (A) NEGATIVE mg/dL   Hgb urine dipstick NEGATIVE NEGATIVE   Bilirubin Urine NEGATIVE NEGATIVE   Ketones, ur NEGATIVE NEGATIVE mg/dL   Protein, ur NEGATIVE NEGATIVE mg/dL   Nitrite NEGATIVE NEGATIVE   Leukocytes, UA SMALL (A) NEGATIVE   RBC / HPF 0-5 0 - 5 RBC/hpf   WBC, UA 6-10 0 - 5 WBC/hpf   Bacteria, UA NONE SEEN NONE SEEN   Squamous Epithelial / LPF 0-5 0 - 5   Mucus PRESENT    Dg Chest 2 View Result Date: 03/30/2018 CLINICAL DATA:  Cough, wheezing, shortness  of breath and weakness EXAM: CHEST - 2 VIEW COMPARISON:  Chest x-ray of 02/03/2018 FINDINGS: No active infiltrate or effusion is seen. Mediastinal and hilar contours are unremarkable. A right-sided Port-A-Cath tip overlies the mid SVC. Mediastinal and hilar contours are unremarkable and the heart is within normal limits in size. Surgical clips overlie the left axilla. There are degenerative changes in the mid to lower thoracic spine. IMPRESSION: No active cardiopulmonary disease. Electronically Signed   By: Ivar Drape M.D.   On: 03/30/2018 15:53    1725:  Pt continues to have intermittent narrow complex tachycardia. Appears SVT on monitor; HR up to 170's, then decreases back down to 110's. 3rd EKG obtained; and confirms SVT. Pt does not want intervention for this. Pt does not want any IVF for orthostatic hypotension. Pt does not want repeat lactic acid. Pt states she does not want any further interventions  in the ED and wants to "leave right now."  Pt has climbed off the stretcher and is sitting in a chair at bedside requesting to be discharged. Very long talk with pt by myself and ED RN regarding. dx testing results, including new EKG changes and that I recommend admission for further evaluation.  Pt refuses admission.  I encouraged pt to stay, continues to refuse.  Pt makes her own medical decisions.  Risks of AMA explained to pt, including, but not limited to:  stroke, heart attack, cardiac arrythmia ("irregular heart rate/beat"), "passing out," temporary and/or permanent disability, death.  Pt verb understanding and continues to refuse admission, understanding the consequences of her decision.  I encouraged pt to follow up with her PMD tomorrow and return to the ED immediately if symptoms return, or for any other concerns.  Pt verb understanding, agreeable. Strict return precautions given.        Final Clinical Impressions(s) / ED Diagnoses   Final diagnoses:  None    ED Discharge Orders    None       Francine Graven, DO 04/01/18 2018

## 2018-03-30 NOTE — ED Provider Notes (Signed)
[facility] EMERGENCY DEPARTMENT Provider Note   CSN: [id number] Arrival date & time: 03/30/18  1939     History   Chief Complaint Chief Complaint  Patient presents with  . Palpitations    HPI Kelli Wilson is a 67 y.o. female.  HPI  Pt was seen at 2005. Per pt, c/o gradual onset and worsening of persistent generalized weakness/fatigue for the past 3 days. Has been associated with nausea, diarrhea, poor PO intake, "chills," home temps to "99.4" and cough. Pt states she has been "feeling my heart racing" for the past several weeks. Pt was evaluated in the ED earlier today, then left AMA "because I needed to take care of some things." Denies vomiting, no black or blood in stools, no CP, no SOB, no abd pain, no back pain, no focal motor weakness.    Past Medical History:  Diagnosis Date  . Anxiety   . Arthritis   . Back pain   . Byssinosis ([place])    secondary to working in a [employer]  . Chest pain   . Chronic pain   . Depression   . Diabetes mellitus without complication ([place])   . Fibromyalgia   . GERD (gastroesophageal reflux disease)   . Hypercholesteremia   . Hypertension   . Knee pain   . Obesity   . Peptic ulcer    history of - normal GI studies Sept 2010    Patient Active Problem List   Diagnosis Date Noted  . Breast cancer, left breast ([place]) 09/15/2017  . Malignant neoplasm of upper-outer quadrant of left female breast ([place])   . Hypercholesteremia   . Hypertension   . Anxiety   . Depression   . Fibromyalgia   . GERD (gastroesophageal reflux disease)   . Byssinosis [facility])     Past Surgical History:  Procedure Laterality Date  . APPENDECTOMY    . bladder rectal repair    . BREAST SURGERY Left    breast biopsy- benign  . CHOLECYSTECTOMY    . DILATION AND CURETTAGE OF UTERUS    . GANGLION CYST EXCISION Left   . MASTECTOMY MODIFIED RADICAL Left 09/15/2017   Procedure: LEFT MODIFIED RADICAL MASTECTOMY;  Surgeon: Aviva Signs, MD;  Location: [facility];   Service: General;  Laterality: Left;  . PORTACATH PLACEMENT Right 10/13/2017   Procedure: INSERTION PORT-A-CATH RIGHT SUBCLAVIAN;  Surgeon: Aviva Signs, MD;  Location: [facility];  Service: General;  Laterality: Right;  . TOTAL ABDOMINAL HYSTERECTOMY       OB History   None      Home Medications    Prior to Admission medications   Medication Sig Start Date End Date Taking? Authorizing Provider  albuterol (PROVENTIL) (2.5 MG/3ML) 0.083% nebulizer solution Take 2.5 mg by nebulization every 6 (six) hours as needed for wheezing or shortness of breath.     [provider]  ALPRAZolam Duanne Moron) 1 MG tablet Take 5 mg by mouth 3 (three) times daily as needed for anxiety.     [provider]  Camphor-Eucalyptus-Menthol (VICKS VAPORUB EX) Apply 1 application daily as needed topically (congestion).    [provider]  Cholecalciferol (VITAMIN D3) 5000 units CAPS Take 5,000 Units by mouth daily.    [provider]  dapagliflozin propanediol (FARXIGA) 5 MG TABS tablet Take 5 mg by mouth daily. Only takes on days she takes steroids.    [provider]  Diphenhyd-Hydrocort-Nystatin (FIRST-DUKES MOUTHWASH) SUSP Swish and swallow 5 mLs no more than every 4  hours. 12/23/17   Higgs, Mathis Dad, MD  diphenoxylate-atropine (LOMOTIL) 2.5-0.025 MG tablet Take 1 tablet by mouth 4 (four) times daily as needed for diarrhea or loose stools. 01/07/18   Holley Bouche, NP  HYDROcodone-acetaminophen (NORCO/VICODIN) 5-325 MG tablet Take 1 tablet by mouth every 4 (four) hours as needed.  11/07/17   [provider]  hydrOXYzine (ATARAX/VISTARIL) 50 MG tablet Take 50 mg by mouth at bedtime.     [provider]  Lidocaine 5 % CREA Apply 1 application topically as needed (for pain).    [provider]  magnesium 30 MG tablet Take 60 mg by mouth 2 (two) times daily.     [provider]  metFORMIN (GLUCOPHAGE) 500 MG tablet Take 500 mg by mouth 2 (two)  times daily with a meal.     [provider]  nitroGLYCERIN (NITROSTAT) 0.4 MG SL tablet Place 0.4 mg under the tongue every 5 (five) minutes as needed for chest pain.    [provider]  omeprazole (PRILOSEC) 40 MG capsule Take 40 mg every other day by mouth. At night    [provider]  Potassium 99 MG TABS Take 2 tablets by mouth daily.    [provider]  pravastatin (PRAVACHOL) 40 MG tablet Take 40 mg by mouth daily.    [provider]  promethazine (PHENERGAN) 25 MG tablet Take 25 mg by mouth every 6 (six) hours as needed for nausea or vomiting.    [provider]  Pseudoeph-Doxylamine-DM-APAP (NYQUIL PO) Take 2 tablets at bedtime as needed by mouth (sinus headaches).    [provider]    Family History Family History  Problem Relation Age of Onset  . Aneurysm Father   . Heart disease Father   . COPD Mother   . Diabetes Mother     Social History Social History   Tobacco Use  . Smoking status: Never Smoker  . Smokeless tobacco: Current User    Types: Snuff  Substance Use Topics  . Alcohol use: No  . Drug use: No     Allergies   Magnesium sulfate; Other; Cortisone; Iohexol; Ivp dye [iodinated diagnostic agents]; Januvia [sitagliptin]; Latex; Motrin [ibuprofen]; Nsaids; Statins; Sulfa antibiotics; and Adhesive [tape]   Review of Systems Review of Systems ROS: Statement: All systems negative except as marked or noted in the HPI; Constitutional: Negative for fever and +"chills," generalized weakness/fatigue. ; ; Eyes: Negative for eye pain, redness and discharge. ; ; ENMT: Negative for ear pain, hoarseness, nasal congestion, sinus pressure and sore throat. ; ; Cardiovascular: Negative for chest pain, palpitations, diaphoresis, dyspnea and peripheral edema. ; ; Respiratory: +cough. Negative for wheezing and stridor. ; ; Gastrointestinal: +nausea, diarrhea, poor PO intake. Negative for vomiting, abdominal pain, blood  in stool, hematemesis, jaundice and rectal bleeding. . ; ; Genitourinary: Negative for dysuria, flank pain and hematuria. ; ; Musculoskeletal: Negative for back pain and neck pain. Negative for swelling and trauma.; ; Skin: Negative for pruritus, rash, abrasions, blisters, bruising and skin lesion.; ; Neuro: Negative for headache, lightheadedness and neck stiffness. Negative for altered level of consciousness, altered mental status, extremity weakness, paresthesias, involuntary movement, seizure and syncope.       Physical Exam Updated Vital Signs BP (!) 133/110 (BP Location: Right Arm)   Pulse (!) 131   Temp 97.8 F (36.6 C) (Oral)   Resp (!) 24   Wt 122 kg (269 lb)   SpO2 97%   BMI 41.51 kg/m  Patient Vitals for the past 24 hrs:  BP Temp Temp src Pulse Resp SpO2 Weight  03/30/18 2032 114/83 98.9 F (37.2 C) Oral (!) 121 18 96 % -  03/30/18 2032 114/83 - - (!) 120 17 96 % -  03/30/18 1953 - - - - - - 122 kg (269 lb)  03/30/18 1952 (!) 133/110 97.8 F (36.6 C) Oral (!) 131 (!) 24 97 % -     Physical Exam 2010: Physical examination:  Nursing notes reviewed; Vital signs and O2 SAT reviewed;  Constitutional: Well developed, Well nourished, In no acute distress; Head:  Normocephalic, atraumatic; Eyes: EOMI, PERRL, No scleral icterus; ENMT: Mouth and pharynx normal, Mucous membranes dry;;; Neck: Supple, Full range of motion, No lymphadenopathy; Cardiovascular: Tachycardic rate and rhythm, No gallop; Respiratory: Breath sounds clear & equal bilaterally, No wheezes.  Speaking full sentences with ease, Normal respiratory effort/excursion; Chest: Nontender, Movement normal; Abdomen: Soft, Nontender, Nondistended, Normal bowel sounds; Genitourinary: No CVA tenderness; Extremities: Peripheral pulses normal, No tenderness, No edema, No calf edema or asymmetry.; Neuro: AA&Ox3, Major CN grossly intact.  Speech clear. No gross focal motor or sensory deficits in extremities.; Skin: Color normal, Warm,  Dry.   ED Treatments / Results  Labs (all labs ordered are listed, but only abnormal results are displayed)   EKG None    Radiology   Procedures Procedures (including critical care time)  Medications Ordered in ED Medications  sodium chloride 0.9 % bolus 500 mL (has no administration in time range)     Initial Impression / Assessment and Plan / ED Course  I have reviewed the triage vital signs and the nursing notes.  Pertinent labs & imaging results that were available during my care of the patient were reviewed by me and considered in my medical decision making (see chart for details).  MDM Reviewed: previous chart, nursing note and vitals Reviewed previous: labs and ECG Interpretation: ECG Total time providing critical care: 30-74 minutes. This excludes time spent performing separately reportable procedures and services. Consults: cardiology and admitting MD   CRITICAL CARE Performed by: Alfonzo Feller Total critical care time: 35 minutes Critical care time was exclusive of separately billable procedures and treating other patients. Critical care was necessary to treat or prevent imminent or life-threatening deterioration. Critical care was time spent personally by me on the following activities: development of treatment plan with patient and/or surrogate as well as nursing, discussions with consultants, evaluation of patient's response to treatment, examination of patient, obtaining history from patient or surrogate, ordering and performing treatments and interventions, ordering and review of laboratory studies, ordering and review of radiographic studies, pulse oximetry and re-evaluation of patient's condition.   ED ECG REPORT   Date: 03/30/2018  Rate: 121  Rhythm: sinus tachycardia and premature atrial contractions (PAC)  QRS Axis: normal  Intervals: normal  ST/T Wave abnormalities: nonspecific ST/T changes  Conduction Disutrbances:none  Narrative  Interpretation:   Old EKG Reviewed: changes noted; since last tracing earlier today narrow complex tachycardia has resolved.  I have personally reviewed the EKG tracing and agree with the computerized printout as noted.   Results for orders placed or performed during the hospital encounter of 03/30/18  Culture, blood (routine x 2)  Result Value Ref Range   Specimen Description RIGHT ANTECUBITAL    Special Requests      BOTTLES DRAWN AEROBIC AND ANAEROBIC Blood Culture adequate volume Performed at [facility], [address], [place], [place] [address]    Culture PENDING  Report Status PENDING   Culture, blood (routine x 2)  Result Value Ref Range   Specimen Description BLOOD RIGHT WRIST    Special Requests      BOTTLES DRAWN AEROBIC AND ANAEROBIC Blood Culture adequate volume Performed at [facility], [address] [place], [place] [address]    Culture PENDING    Report Status PENDING   Comprehensive metabolic panel  Result Value Ref Range   Sodium 139 135 - 145 mmol/L   Potassium 3.5 3.5 - 5.1 mmol/L   Chloride 106 101 - 111 mmol/L   CO2 23 22 - 32 mmol/L   Glucose, Bld 167 (H) 65 - 99 mg/dL   BUN 13 6 - 20 mg/dL   Creatinine, Ser 0.85 0.44 - 1.00 mg/dL   Calcium 9.1 8.9 - 10.3 mg/dL   Total Protein 6.7 6.5 - 8.1 g/dL   Albumin 3.6 3.5 - 5.0 g/dL   AST 25 15 - 41 U/L   ALT 23 14 - 54 U/L   Alkaline Phosphatase 72 38 - 126 U/L   Total Bilirubin 0.5 0.3 - 1.2 mg/dL   GFR calc non Af Amer >60 >60 mL/min   GFR calc Af Amer >60 >60 mL/min   Anion gap 10 5 - 15  CBC with Differential  Result Value Ref Range   WBC 5.5 4.0 - 10.5 K/uL   RBC 3.67 (L) 3.87 - 5.11 MIL/uL   Hemoglobin 11.2 (L) 12.0 - 15.0 g/dL   HCT 33.8 (L) 36.0 - 46.0 %   MCV 92.1 78.0 - 100.0 fL   MCH 30.5 26.0 - 34.0 pg   MCHC 33.1 30.0 - 36.0 g/dL   RDW 14.9 11.5 - 15.5 %   Platelets 269 150 - 400 K/uL   Neutrophils Relative % 57 %   Neutro Abs 3.1 1.7 - 7.7 K/uL   Lymphocytes Relative 30 %    Lymphs Abs 1.7 0.7 - 4.0 K/uL   Monocytes Relative 8 %   Monocytes Absolute 0.4 0.1 - 1.0 K/uL   Eosinophils Relative 4 %   Eosinophils Absolute 0.2 0.0 - 0.7 K/uL   Basophils Relative 1 %   Basophils Absolute 0.1 0.0 - 0.1 K/uL  Troponin I  Result Value Ref Range   Troponin I <0.03 <0.03 ng/mL  Lactic acid, plasma  Result Value Ref Range   Lactic Acid, Venous 2.3 (HH) 0.5 - 1.9 mmol/L  Urinalysis, Routine w reflex microscopic  Result Value Ref Range   Color, Urine YELLOW YELLOW   APPearance HAZY (A) CLEAR   Specific Gravity, Urine 1.024 1.005 - 1.030   pH 5.0 5.0 - 8.0   Glucose, UA 50 (A) NEGATIVE mg/dL   Hgb urine dipstick NEGATIVE NEGATIVE   Bilirubin Urine NEGATIVE NEGATIVE   Ketones, ur NEGATIVE NEGATIVE mg/dL   Protein, ur NEGATIVE NEGATIVE mg/dL   Nitrite NEGATIVE NEGATIVE   Leukocytes, UA SMALL (A) NEGATIVE   RBC / HPF 0-5 0 - 5 RBC/hpf   WBC, UA 6-10 0 - 5 WBC/hpf   Bacteria, UA NONE SEEN NONE SEEN   Squamous Epithelial / LPF 0-5 0 - 5   Mucus PRESENT    Dg Chest 2 View Result Date: 03/30/2018 CLINICAL DATA:  Cough, wheezing, shortness of breath and weakness EXAM: CHEST - 2 VIEW COMPARISON:  Chest x-ray of 02/03/2018 FINDINGS: No active infiltrate or effusion is seen. Mediastinal and hilar contours are unremarkable. A right-sided Port-A-Cath tip overlies the mid SVC. Mediastinal and hilar contours are unremarkable  and the heart is within normal limits in size. Surgical clips overlie the left axilla. There are degenerative changes in the mid to lower thoracic spine. IMPRESSION: No active cardiopulmonary disease. Electronically Signed   By: Ivar Drape M.D.   On: 03/30/2018 15:53     2035:  Pt's HR very variable:  Will be 170's narrow complex regular tachycardia on monitor then will decrease to 110's sinus tachycardia. Will start IV cardizem gtt to control rate. Pt denies CP.  Pt was orthostatic on VS from earlier today and refused IVF; will give judicious IVF bolus now.   T/C returned from [place] Dr. Aundra Dubin, case discussed, including:  HPI, pertinent PM/SHx, VS/PE, dx testing, ED course and treatment:  OK to stay at [facility], agrees with IV cardizem gtt to control HR, can change to PO meds tomorrow, continue to rehydrate, admit to [facility].   2100:  T/C returned from [facility] Dr. Darrick Meigs, case discussed, including:  HPI, pertinent PM/SHx, VS/PE, dx testing, ED course and treatment, as well as d/w Cards MD:  Agreeable to admit.      Final Clinical Impressions(s) / ED Diagnoses   Final diagnoses:  None    ED Discharge Orders    None       Francine Graven, DO 04/01/18 2016

## 2018-03-30 NOTE — H&P (Signed)
TRH H&P    Patient Demographics:    Kelli Wilson, is a 67 y.o. female  MRN: [id number]  DOB - 17-Jun-1951  Admit Date - 03/30/2018  Referring MD/NP/PA: Dr. Thurnell Garbe  Outpatient Primary MD for the patient is Lucia Gaskins, MD  Patient coming from: Home\ Chief complaint-palpitations   HPI:    Kelli Wilson  is a 66 y.o. female, with history of breast cancer status post left modified radical mastectomy, chemotherapy with paclitaxel, patient is still currently on chemotherapy.  Patient says since her last chemotherapy last week she has experienced generalized weakness, nausea but no vomiting or diarrhea.  Today in the ED patient was found to be in SVT, and had already left AMA but came back. Patient found to be in intermittent bursts of SVT which would resolve of its own.  ED physician called and discussed with cardiology and they recommended Cardizem infusion. Patient denies feeling funny in the chest but no chest pain.  No shortness of breath Complains of nausea but no vomiting. No abdominal pain. Denies dysuria urgency or frequency of urination.    Review of systems:     All other systems reviewed and are negative.   With Past History of the following :    Past Medical History:  Diagnosis Date  . Anxiety   . Arthritis   . Back pain   . Byssinosis ([place])    secondary to working in a [employer]  . Chest pain   . Chronic pain   . Depression   . Diabetes mellitus without complication ([place])   . Fibromyalgia   . GERD (gastroesophageal reflux disease)   . Hypercholesteremia   . Hypertension   . Knee pain   . Obesity   . Peptic ulcer    history of - normal GI studies Sept 2010      Past Surgical History:  Procedure Laterality Date  . APPENDECTOMY    . bladder rectal repair    . BREAST SURGERY Left    breast biopsy- benign  . CHOLECYSTECTOMY    . DILATION AND CURETTAGE OF UTERUS    .  GANGLION CYST EXCISION Left   . MASTECTOMY MODIFIED RADICAL Left 09/15/2017   Procedure: LEFT MODIFIED RADICAL MASTECTOMY;  Surgeon: Aviva Signs, MD;  Location: [facility];  Service: General;  Laterality: Left;  . PORTACATH PLACEMENT Right 10/13/2017   Procedure: INSERTION PORT-A-CATH RIGHT SUBCLAVIAN;  Surgeon: Aviva Signs, MD;  Location: [facility];  Service: General;  Laterality: Right;  . TOTAL ABDOMINAL HYSTERECTOMY        Social History:      Social History   Tobacco Use  . Smoking status: Never Smoker  . Smokeless tobacco: Current User    Types: Snuff  Substance Use Topics  . Alcohol use: No       Family History :     Family History  Problem Relation Age of Onset  . Aneurysm Father   . Heart disease Father   . COPD Mother   . Diabetes Mother  Home Medications:   Prior to Admission medications   Medication Sig Start Date End Date Taking? Authorizing Provider  albuterol (PROVENTIL) (2.5 MG/3ML) 0.083% nebulizer solution Take 2.5 mg by nebulization every 6 (six) hours as needed for wheezing or shortness of breath.    Yes [provider]  ALPRAZolam Duanne Moron) 0.5 MG tablet Take 0.5 mg by mouth 4 (four) times daily as needed for anxiety.    Yes [provider]  Cholecalciferol (VITAMIN D3) 5000 units CAPS Take 5,000 Units by mouth daily.   Yes [provider]  Diphenhyd-Hydrocort-Nystatin (FIRST-DUKES MOUTHWASH) SUSP Swish and swallow 5 mLs no more than every 4 hours. Patient taking differently: Use as directed 5 mLs in the mouth or throat every 4 (four) hours as needed. Swish and swallow 5 mLs no more than every 4 hours. 12/23/17  Yes Higgs, Mathis Dad, MD  diphenoxylate-atropine (LOMOTIL) 2.5-0.025 MG tablet Take 1 tablet by mouth 4 (four) times daily as needed for diarrhea or loose stools. 01/07/18  Yes Holley Bouche, NP  HYDROcodone-acetaminophen (NORCO/VICODIN) 5-325 MG tablet Take 1-2 tablets by mouth every 6 (six) hours as needed for  moderate pain.  11/07/17  Yes [provider]  hydrOXYzine (ATARAX/VISTARIL) 50 MG tablet Take 50 mg by mouth every other day. Every other day at bedtime and as needed   Yes [provider]  Lidocaine 5 % CREA Apply 1 application topically as needed (for pain).   Yes [provider]  MAGNESIUM PO Take 2 tablets by mouth daily.    Yes [provider]  metFORMIN (GLUCOPHAGE) 500 MG tablet Take 500 mg by mouth 2 (two) times daily with a meal.    Yes [provider]  nitroGLYCERIN (NITROSTAT) 0.4 MG SL tablet Place 0.4 mg under the tongue every 5 (five) minutes as needed for chest pain.   Yes [provider]  Potassium 99 MG TABS Take 2 tablets by mouth daily.   Yes [provider]  pravastatin (PRAVACHOL) 40 MG tablet Take 40 mg by mouth daily.   Yes [provider]  promethazine (PHENERGAN) 25 MG tablet Take 25 mg by mouth every 6 (six) hours as needed for nausea or vomiting.   Yes [provider]  Pseudoeph-Doxylamine-DM-APAP (NYQUIL PO) Take 1-2 capsules by mouth at bedtime as needed (sinus headaches).    Yes [provider]  omeprazole (PRILOSEC) 40 MG capsule Take 40 mg every other day by mouth. At night    [provider]     Allergies:     Allergies  Allergen Reactions  . Magnesium Sulfate Shortness Of Breath    Experienced shortness of breath and chest pain during mag sulfate infusion. -11/21/17 -gwd  . Other Anaphylaxis and Other (See Comments)    Lysol and other cleaning products  . Cortisone     Patient passed out   . Iohexol Other (See Comments)     Code: HIVES, Desc: PT STATES SHE WAS GIVEN IV DYE AT [facility] AND BROKE OUT IN HIVES, RASH, AND HAD TO BE HOSPITALIZED DUE TO REACTION   . Ivp Dye [Iodinated Diagnostic Agents] Other (See Comments)    Unknown  . Januvia [Sitagliptin]     Stomach pains   . Latex Itching  . Motrin [Ibuprofen] Other (See Comments)    Unknown  . Nsaids  Other (See Comments)    Unknown  . Statins Other (See Comments)    Unknown  . Sulfa Antibiotics Other (See Comments)    Unknown  .  Adhesive [Tape] Rash     Physical Exam:   Vitals  Blood pressure (!) 108/58, pulse (!) 109, temperature 98.9 F (37.2 C), temperature source Oral, resp. rate (!) 22, weight 122 kg (269 lb), SpO2 95 %.  1.  General: Appears in no acute distress  2. Psychiatric:  Intact judgement and  insight, awake alert, oriented x 3.  3. Neurologic: No focal neurological deficits, all cranial nerves intact.Strength 5/5 all 4 extremities, sensation intact all 4 extremities, plantars down going.  4. Eyes :  anicteric sclerae, moist conjunctivae with no lid lag. PERRLA.  5. ENMT:  Oropharynx clear with moist mucous membranes and good dentition  6. Neck:  supple, no cervical lymphadenopathy appriciated, No thyromegaly  7. Respiratory : Normal respiratory effort, good air movement bilaterally,clear to  auscultation bilaterally  8. Cardiovascular : RRR, no gallops, rubs or murmurs, no leg edema  9. Gastrointestinal:  Positive bowel sounds, abdomen soft, non-tender to palpation,no hepatosplenomegaly, no rigidity or guarding       10. Skin:  No cyanosis, normal texture and turgor, no rash, lesions or ulcers  11.Musculoskeletal:  Good muscle tone,  joints appear normal , no effusions,  normal range of motion    Data Review:    CBC Recent Labs  Lab 03/24/18 1035 03/30/18 1425  WBC 6.5 5.5  HGB 11.2* 11.2*  HCT 35.0* 33.8*  PLT 272 269  MCV 92.8 92.1  MCH 29.7 30.5  MCHC 32.0 33.1  RDW 15.5 14.9  LYMPHSABS 2.3 1.7  MONOABS 0.5 0.4  EOSABS 0.2 0.2  BASOSABS 0.0 0.1   ------------------------------------------------------------------------------------------------------------------  Chemistries  Recent Labs  Lab 03/24/18 1035 03/30/18 1425  NA 137 139  K 3.7 3.5  CL 102 106  CO2 23 23  GLUCOSE 122* 167*  BUN 10 13  CREATININE 1.00 0.85   CALCIUM 8.9 9.1  AST 29 25  ALT 24 23  ALKPHOS 68 72  BILITOT 0.7 0.5   ------------------------------------------------------------------------------------------------------------------  ------------------------------------------------------------------------------------------------------------------ GFR: Estimated Creatinine Clearance: 85.3 mL/min (by C-G formula based on SCr of 0.85 mg/dL). Liver Function Tests: Recent Labs  Lab 03/24/18 1035 03/30/18 1425  AST 29 25  ALT 24 23  ALKPHOS 68 72  BILITOT 0.7 0.5  PROT 6.7 6.7  ALBUMIN 3.6 3.6   No results for input(s): LIPASE, AMYLASE in the last 168 hours. No results for input(s): AMMONIA in the last 168 hours. Coagulation Profile: No results for input(s): INR, PROTIME in the last 168 hours. Cardiac Enzymes: Recent Labs  Lab 03/30/18 1524  TROPONINI <0.03    --------------------------------------------------------------------------------------------------------------- Urine analysis:    Component Value Date/Time   COLORURINE YELLOW 03/30/2018 1457   APPEARANCEUR HAZY (A) 03/30/2018 1457   LABSPEC 1.024 03/30/2018 1457   PHURINE 5.0 03/30/2018 1457   GLUCOSEU 50 (A) 03/30/2018 1457   HGBUR NEGATIVE 03/30/2018 1457   BILIRUBINUR NEGATIVE 03/30/2018 1457   KETONESUR NEGATIVE 03/30/2018 1457   PROTEINUR NEGATIVE 03/30/2018 1457   NITRITE NEGATIVE 03/30/2018 1457   LEUKOCYTESUR SMALL (A) 03/30/2018 1457      Imaging Results:    Dg Chest 2 View  Result Date: 03/30/2018 CLINICAL DATA:  Cough, wheezing, shortness of breath and weakness EXAM: CHEST - 2 VIEW COMPARISON:  Chest x-ray of 02/03/2018 FINDINGS: No active infiltrate or effusion is seen. Mediastinal and hilar contours are unremarkable. A right-sided Port-A-Cath tip overlies the mid SVC. Mediastinal and hilar contours are unremarkable and the heart is within normal limits in size. Surgical clips overlie the left axilla.  There are degenerative changes in the  mid to lower thoracic spine. IMPRESSION: No active cardiopulmonary disease. Electronically Signed   By: Ivar Drape M.D.   On: 03/30/2018 15:53    My personal review of EKG: Rhythm SVT   Assessment & Plan:    Active Problems:   Hypertension   Anxiety   Depression   Malignant neoplasm of upper-outer quadrant of left female breast (HCC)   SVT (supraventricular tachycardia) ([place])   1. SVT-patient undergoing intermittent bursts of SVT, started on IV Cardizem infusion in the ED as per Dr. Claris Gladden recommendation.  Will continue with Cardizem infusion and consult cardiology in a.m. for further recommendation.  2. Dehydration-patient has been complaining of nausea with generalized weakness since her last chemotherapy.  She was found to have elevated lactic acid 2.3 likely from dehydration.  Started on IV normal saline.  Continue normal saline at 100 mL/h.  3. Chronic pain syndrome-continue Vicodin as needed  4. Anxiety/depression-continue Xanax 1 mg 3 times daily as needed  5. Breast cancer-status post left modified radical mastectomy, currently on chemotherapy with paclitaxel.  Followed by oncology as outpatient.  6. Diabetes mellitus-hold metformin, will start sliding scale insulin with NovoLog.  7. COPD-stable, no exacerbation continue albuterol as needed   DVT Prophylaxis-   Lovenox   AM Labs Ordered, also please review Full Orders  Family Communication: Admission, patients condition and plan of care including tests being ordered have been discussed with the patient  who indicate understanding and agree with the plan and Code Status.  Code Status: Full code  Admission status: Inpatient  Time spent in minutes : 60 minutes   Oswald Hillock M.D on 03/30/2018 at 9:49 PM  Between 7am to 7pm - Pager - [phone]. After 7pm go to [email or web address] - password [facility]  [facility] - Office  [phone]

## 2018-03-30 NOTE — ED Triage Notes (Signed)
Pt had her last chemo treatment Tuesday.  Pt c/o of weakness, cough, chills 2 days after.

## 2018-03-31 ENCOUNTER — Ambulatory Visit (HOSPITAL_COMMUNITY): Payer: Medicare Other | Admitting: Hematology

## 2018-03-31 ENCOUNTER — Encounter: Payer: Self-pay | Admitting: General Practice

## 2018-03-31 ENCOUNTER — Other Ambulatory Visit: Payer: Self-pay | Admitting: *Deleted

## 2018-03-31 ENCOUNTER — Ambulatory Visit (HOSPITAL_COMMUNITY): Payer: Medicare Other

## 2018-03-31 ENCOUNTER — Other Ambulatory Visit (HOSPITAL_COMMUNITY): Payer: Medicare Other

## 2018-03-31 DIAGNOSIS — I471 Supraventricular tachycardia: Principal | ICD-10-CM

## 2018-03-31 LAB — COMPREHENSIVE METABOLIC PANEL
ALBUMIN: 3.1 g/dL — AB (ref 3.5–5.0)
ALK PHOS: 51 U/L (ref 38–126)
ALT: 17 U/L (ref 14–54)
ANION GAP: 7 (ref 5–15)
AST: 17 U/L (ref 15–41)
BILIRUBIN TOTAL: 0.5 mg/dL (ref 0.3–1.2)
BUN: 12 mg/dL (ref 6–20)
CALCIUM: 8.3 mg/dL — AB (ref 8.9–10.3)
CO2: 24 mmol/L (ref 22–32)
Chloride: 108 mmol/L (ref 101–111)
Creatinine, Ser: 0.78 mg/dL (ref 0.44–1.00)
GFR calc Af Amer: >60 mL/min (ref >60)
Glucose, Bld: 117 mg/dL — ABNORMAL HIGH (ref 65–99)
Potassium: 3.1 mmol/L — ABNORMAL LOW (ref 3.5–5.1)
Sodium: 139 mmol/L (ref 135–145)
TOTAL PROTEIN: 5.8 g/dL — AB (ref 6.5–8.1)

## 2018-03-31 LAB — GLUCOSE, CAPILLARY
GLUCOSE-CAPILLARY: 179 mg/dL — AB (ref 65–99)
GLUCOSE-CAPILLARY: 108 mg/dL — AB (ref 65–99)
GLUCOSE-CAPILLARY: 140 mg/dL — AB (ref 65–99)
Glucose-Capillary: 115 mg/dL — ABNORMAL HIGH (ref 65–99)

## 2018-03-31 LAB — MAGNESIUM: MAGNESIUM: 1.3 mg/dL — AB (ref 1.7–2.4)

## 2018-03-31 LAB — CBC
HEMATOCRIT: 31.1 % — AB (ref 36.0–46.0)
HEMOGLOBIN: 10.3 g/dL — AB (ref 12.0–15.0)
MCH: 30.7 pg (ref 26.0–34.0)
MCHC: 33.1 g/dL (ref 30.0–36.0)
MCV: 92.6 fL (ref 78.0–100.0)
Platelets: 238 10*3/uL (ref 150–400)
RBC: 3.36 MIL/uL — ABNORMAL LOW (ref 3.87–5.11)
RDW: 14.9 % (ref 11.5–15.5)
WBC: 5.7 10*3/uL (ref 4.0–10.5)

## 2018-03-31 LAB — MRSA PCR SCREENING: MRSA BY PCR: NEGATIVE

## 2018-03-31 LAB — HEMOGLOBIN A1C
HEMOGLOBIN A1C: 6.7 % — AB (ref 4.8–5.6)
Mean Plasma Glucose: 145.59 mg/dL

## 2018-03-31 LAB — TSH: TSH: 1.613 u[IU]/mL (ref 0.350–4.500)

## 2018-03-31 MED ORDER — LOPERAMIDE HCL 2 MG PO CAPS
2.0000 mg | ORAL_CAPSULE | ORAL | Status: DC | PRN
  Administered 2018-03-31: 2 mg via ORAL
  Filled 2018-03-31: qty 1

## 2018-03-31 MED ORDER — METOPROLOL TARTRATE 25 MG PO TABS
25.0000 mg | ORAL_TABLET | Freq: Three times a day (TID) | ORAL | Status: DC
  Administered 2018-03-31 (×3): 25 mg via ORAL
  Filled 2018-03-31 (×3): qty 1

## 2018-03-31 MED ORDER — POTASSIUM CHLORIDE CRYS ER 20 MEQ PO TBCR
40.0000 meq | EXTENDED_RELEASE_TABLET | Freq: Two times a day (BID) | ORAL | Status: AC
  Administered 2018-03-31 (×2): 40 meq via ORAL
  Filled 2018-03-31 (×2): qty 2

## 2018-03-31 NOTE — Progress Notes (Signed)
[facility] CSW Progress Notes  Call from patient, states she has been admitted to [facility] ICU.  Asked that CSW notify [place] and [place] staff.  Concerned that she has received call from "Fritz Pickerel" about her lymphedema vest.  Is working w Berneta Sages, OT at [facility] lymphedema treatment [phone] option 2) to address needs re lymphedema.  OT notified and asked to assist.  [place] notified [place] staff, will visit patient later today.  Edwyna Shell, LCSW Clinical Social Worker Phone:  [phone]

## 2018-03-31 NOTE — Consult Note (Signed)
Cardiology Consultation:   Patient ID: Kelli Wilson; [id number]; 02-Sep-1951   Admit date: 03/30/2018 Date of Consult: 03/31/2018  Primary Care Provider: Lucia Gaskins, MD Primary Cardiologist: Dr Bronson Ing Primary Electrophysiologist:  na   Patient Profile:   Kelli Wilson is a 67 y.o. female with a hx of breast cancer who is being seen today for the evaluation of tachycardia at the request of Dr Cindie Laroche.  History of Present Illness:   Kelli Wilson 67 yo female history of breast CA s/p chemo, DM2, fibromyalgia. From notes long time history of chest pain with negative cath ni 2005 and nuclear stress test in 2011. From Dr Raylene Everts last clinic note Jan 2019 plans were for repeat nuclear stress test due to symptoms but does not appear this was completed. Admitted with palpitations, found to be in SVT in ER, started on dilt gtt.   She reports intermittent episodes of SOB, palpitations, lightheadedness dizziness over the last few weeks.     K 3.5 Cr 0.85 WBC 5.5 Hgb 11.2 Plt 269 lactic acid 2.3  Trop neg CXR no acute process Echo 10/2017 echo: 53-61%, grade I diastolic dysfunction EKG SR, runs of long RP tachycardia likely atach  Past Medical History:  Diagnosis Date  . Anxiety   . Arthritis   . Back pain   . Byssinosis ([place])    secondary to working in a [employer]  . Chest pain   . Chronic pain   . Depression   . Diabetes mellitus without complication ([place])   . Fibromyalgia   . GERD (gastroesophageal reflux disease)   . Hypercholesteremia   . Hypertension   . Knee pain   . Obesity   . Peptic ulcer    history of - normal GI studies Sept 2010    Past Surgical History:  Procedure Laterality Date  . APPENDECTOMY    . bladder rectal repair    . BREAST SURGERY Left    breast biopsy- benign  . CHOLECYSTECTOMY    . DILATION AND CURETTAGE OF UTERUS    . GANGLION CYST EXCISION Left   . MASTECTOMY MODIFIED RADICAL Left 09/15/2017   Procedure: LEFT MODIFIED  RADICAL MASTECTOMY;  Surgeon: Aviva Signs, MD;  Location: [facility];  Service: General;  Laterality: Left;  . PORTACATH PLACEMENT Right 10/13/2017   Procedure: INSERTION PORT-A-CATH RIGHT SUBCLAVIAN;  Surgeon: Aviva Signs, MD;  Location: [facility];  Service: General;  Laterality: Right;  . TOTAL ABDOMINAL HYSTERECTOMY         Inpatient Medications: Scheduled Meds: . cholecalciferol  5,000 Units Oral Daily  . enoxaparin (LOVENOX) injection  0.5 mg/kg Subcutaneous Q24H  . insulin aspart  0-9 Units Subcutaneous TID WC  . magnesium oxide  400 mg Oral BID  . pantoprazole  40 mg Oral QHS  . pravastatin  40 mg Oral Daily   Continuous Infusions: . sodium chloride 1,000 mL (03/31/18 0853)  . diltiazem (CARDIZEM) infusion 5 mg/hr (03/31/18 0849)   PRN Meds: albuterol, ALPRAZolam, HYDROcodone-acetaminophen, ondansetron **OR** ondansetron (ZOFRAN) IV  Allergies:    Allergies  Allergen Reactions  . Magnesium Sulfate Shortness Of Breath    Experienced shortness of breath and chest pain during mag sulfate infusion. -11/21/17 -gwd  . Other Anaphylaxis and Other (See Comments)    Lysol and other cleaning products  . Cortisone     Patient passed out   . Iohexol Other (See Comments)     Code: HIVES, Desc: PT STATES SHE WAS GIVEN IV DYE AT [facility]  BROKE OUT IN HIVES, RASH, AND HAD TO BE HOSPITALIZED DUE TO REACTION   . Ivp Dye [Iodinated Diagnostic Agents] Other (See Comments)    Unknown  . Januvia [Sitagliptin]     Stomach pains   . Latex Itching  . Motrin [Ibuprofen] Other (See Comments)    Unknown  . Nsaids Other (See Comments)    Unknown  . Statins Other (See Comments)    Unknown  . Sulfa Antibiotics Other (See Comments)    Unknown  . Adhesive [Tape] Rash    Social History:   Social History   Socioeconomic History  . Marital status: Married    Spouse name: Not on file  . Number of children: Not on file  . Years of education: Not on file  . Highest education level: Not  on file  Occupational History  . 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 Financial resource strain: Not on file  . Food insecurity:    Worry: Not on file    Inability: Not on file  . Transportation needs:    Medical: Not on file    Non-medical: Not on file  Tobacco Use  . Smoking status: Never Smoker  . Smokeless tobacco: Current User    Types: Snuff  Substance and Sexual Activity  . Alcohol use: No  . Drug use: No  . Sexual activity: Never    Birth control/protection: Surgical  Lifestyle  . Physical activity:    Days per week: Not on file    Minutes per session: Not on file  . Stress: Not on file  Relationships  . Social connections:    Talks on phone: Not on file    Gets together: Not on file    Attends religious service: Not on file    Active member of club or organization: Not on file    Attends meetings of clubs or organizations: Not on file    Relationship status: Not on file  . Intimate partner violence:    Fear of current or ex partner: Not on file    Emotionally abused: Not on file    Physically abused: Not on file    Forced sexual activity: Not on file  Other Topics Concern  . Not on file  Social History Narrative  . Not on file    Family History:    Family History  Problem Relation Age of Onset  . Aneurysm Father   . Heart disease Father   . COPD Mother   . Diabetes Mother      ROS:  Please see the history of present illness.  All other ROS reviewed and negative.     Physical Exam/Data:   Vitals:   03/31/18 0500 03/31/18 0515 03/31/18 0740 03/31/18 1131  BP: 112/61 (!) 102/58    Pulse: 87 79 78 88  Resp: (!) 21 14 14  (!) 21  Temp:   97.7 F (36.5 C) 98 F (36.7 C)  TempSrc:   Oral Oral  SpO2: 97% 97% 96% 97%  Weight: 269 lb 6.4 oz (122.2 kg)     Height:        Intake/Output Summary (Last 24 hours) at 03/31/2018 1132 Last data filed at 03/31/2018 0500 Gross per 24 hour  Intake 1270.58  ml  Output -  Net 1270.58 ml   Filed Weights   03/30/18 1953 03/30/18 2307 03/31/18 0500  Weight: 269 lb (122 kg) 267 lb 3.2 oz (121.2 kg)  269 lb 6.4 oz (122.2 kg)   Body mass index is 41.57 kg/m.  General:  Well nourished, well developed, in no acute distress HEENT: normal Lymph: no adenopathy Neck: no JVD Endocrine:  No thryomegaly Cardiac:  normal S1, S2; RRR; no murmur  Lungs:  clear to auscultation bilaterally, no wheezing, rhonchi or rales  Abd: soft, nontender, no hepatomegaly  Ext: no edema Musculoskeletal:  No deformities, BUE and BLE strength normal and equal Skin: warm and dry  Neuro:  CNs 2-12 intact, no focal abnormalities noted Psych:  Normal affect     Laboratory Data:  Chemistry Recent Labs  Lab 03/30/18 1425 03/31/18 0420  NA 139 139  K 3.5 3.1*  CL 106 108  CO2 23 24  GLUCOSE 167* 117*  BUN 13 12  CREATININE 0.85 0.78  CALCIUM 9.1 8.3*  GFRNONAA >60 >60  GFRAA >60 >60  ANIONGAP 10 7    Recent Labs  Lab 03/30/18 1425 03/31/18 0420  PROT 6.7 5.8*  ALBUMIN 3.6 3.1*  AST 25 17  ALT 23 17  ALKPHOS 72 51  BILITOT 0.5 0.5   Hematology Recent Labs  Lab 03/30/18 1425 03/31/18 0420  WBC 5.5 5.7  RBC 3.67* 3.36*  HGB 11.2* 10.3*  HCT 33.8* 31.1*  MCV 92.1 92.6  MCH 30.5 30.7  MCHC 33.1 33.1  RDW 14.9 14.9  PLT 269 238   Cardiac Enzymes Recent Labs  Lab 03/30/18 1524  TROPONINI <0.03   No results for input(s): TROPIPOC in the last 168 hours.  BNPNo results for input(s): BNP, PROBNP in the last 168 hours.  DDimer No results for input(s): DDIMER in the last 168 hours.  Radiology/Studies:  Dg Chest 2 View  Result Date: 03/30/2018 CLINICAL DATA:  Cough, wheezing, shortness of breath and weakness EXAM: CHEST - 2 VIEW COMPARISON:  Chest x-ray of 02/03/2018 FINDINGS: No active infiltrate or effusion is seen. Mediastinal and hilar contours are unremarkable. A right-sided Port-A-Cath tip overlies the mid SVC. Mediastinal and hilar  contours are unremarkable and the heart is within normal limits in size. Surgical clips overlie the left axilla. There are degenerative changes in the mid to lower thoracic spine. IMPRESSION: No active cardiopulmonary disease. Electronically Signed   By: Ivar Drape M.D.   On: 03/30/2018 15:53    Assessment and Plan:   1. PSVT - PSVT, from review of EKGs long RP likely atach. Replace K, check Mg. Currently on dilt gtt, will transition to oral beta blocker.  - wean dilt gtt, start lopressor 25mg  po q 8 hrs.  - keep K at 4, Mg at 2   2. Hypokalemia - likely from her diarrhea associated with her ongoing chemo - I have written for 91mEq KCl oral x 2 doses  - added Mg to this AM labs.   For questions or updates, please contact [place] Please consult [email or web address] for contact info under Cardiology/STEMI.   Merrily Pew, MD  03/31/2018 11:32 AM

## 2018-03-31 NOTE — Progress Notes (Signed)
Appreciate cardiology note.  Patient arrived with SVT currently in sinus rhythm calcium 3.1 currently on KCl 40 p.o. twice daily as well as chronic magnesium supplementation 400 p.o. twice daily we will check potassium and magnesium in a.m. Kelli Wilson OEV:[id number] DOB: May 22, 1951 DOA: 03/30/2018 PCP: Kelli Gaskins, MD   Physical Exam: Blood pressure (!) 102/58, pulse 88, temperature 98 F (36.7 C), temperature source Oral, resp. rate (!) 21, height 5' 7.5" (1.715 m), weight 122.2 kg (269 lb 6.4 oz), SpO2 97 %.  Lungs clear to A&P no rales wheeze or rhonchi heart regular rhythm no S3-S4 no heaves or rubs abdomen soft nontender bowel sounds normoactive no guarding no rebound   Investigations:  Recent Results (from the past 240 hour(s))  Culture, blood (routine x 2)     Status: None (Preliminary result)   Collection Time: 03/30/18  3:24 PM  Result Value Ref Range Status   Specimen Description RIGHT ANTECUBITAL  Final   Special Requests   Final    BOTTLES DRAWN AEROBIC AND ANAEROBIC Blood Culture adequate volume   Culture   Final    NO GROWTH < 24 HOURS Performed at [facility], [address] [place], [place] [address]    Report Status PENDING  Incomplete  Culture, blood (routine x 2)     Status: None (Preliminary result)   Collection Time: 03/30/18  3:29 PM  Result Value Ref Range Status   Specimen Description BLOOD RIGHT WRIST  Final   Special Requests   Final    BOTTLES DRAWN AEROBIC AND ANAEROBIC Blood Culture adequate volume   Culture   Final    NO GROWTH < 24 HOURS Performed at [facility], [address] [place], [place] [address]    Report Status PENDING  Incomplete  MRSA PCR Screening     Status: None   Collection Time: 03/30/18 11:06 PM  Result Value Ref Range Status   MRSA by PCR NEGATIVE NEGATIVE Final    Comment:        The GeneXpert MRSA Assay (FDA approved for NASAL specimens only), is one component of a comprehensive MRSA colonization surveillance  program. It is not intended to diagnose MRSA infection nor to guide or monitor treatment for MRSA infections. Performed at [facility], [address], [place], [place] [address]      Basic Metabolic Panel: Recent Labs    03/30/18 1425 03/31/18 0420  NA 139 139  K 3.5 3.1*  CL 106 108  CO2 23 24  GLUCOSE 167* 117*  BUN 13 12  CREATININE 0.85 0.78  CALCIUM 9.1 8.3*   Liver Function Tests: Recent Labs    03/30/18 1425 03/31/18 0420  AST 25 17  ALT 23 17  ALKPHOS 72 51  BILITOT 0.5 0.5  PROT 6.7 5.8*  ALBUMIN 3.6 3.1*     CBC: Recent Labs    03/30/18 1425 03/31/18 0420  WBC 5.5 5.7  NEUTROABS 3.1  --   HGB 11.2* 10.3*  HCT 33.8* 31.1*  MCV 92.1 92.6  PLT 269 238    Dg Chest 2 View  Result Date: 03/30/2018 CLINICAL DATA:  Cough, wheezing, shortness of breath and weakness EXAM: CHEST - 2 VIEW COMPARISON:  Chest x-ray of 02/03/2018 FINDINGS: No active infiltrate or effusion is seen. Mediastinal and hilar contours are unremarkable. A right-sided Port-A-Cath tip overlies the mid SVC. Mediastinal and hilar contours are unremarkable and the heart is within normal limits in size. Surgical clips overlie the left axilla. There are  degenerative changes in the mid to lower thoracic spine. IMPRESSION: No active cardiopulmonary disease. Electronically Signed   By: Ivar Drape M.D.   On: 03/30/2018 15:53      Medications:  Impression:  Active Problems:   Hypertension   Anxiety   Depression   Malignant neoplasm of upper-outer quadrant of left female breast (HCC)   SVT (supraventricular tachycardia) (HCC)     Plan: KCl 40 mill milliequivalents p.o. twice daily magnesium oxide 400 mg p.o. twice daily get be met and magnesium level in a.m.  Lopressor 25 p.o. twice daily wean diltiazem infusion  Consultants: Cardiology   Procedures   Antibiotics:         Time spent: 30 minutes   LOS: 1 day   [name]   03/31/2018, 12:35 PM

## 2018-03-31 NOTE — Patient Outreach (Signed)
[facility]) Care Management  03/31/2018  YARI SZELIGA 24-Jun-1951 [id number]   Received in basket message of patient admission to [facility] on 5/6. Sent in basket message to [place], [facility] Liaison to notify of  patient status .  Will follow progress.   Joylene Draft, RN, [place] Management Coordinator  [phone]- Mobile [phone]- Toll Free Main Office

## 2018-04-01 LAB — GLUCOSE, CAPILLARY
GLUCOSE-CAPILLARY: 112 mg/dL — AB (ref 65–99)
GLUCOSE-CAPILLARY: 113 mg/dL — AB (ref 65–99)
Glucose-Capillary: 107 mg/dL — ABNORMAL HIGH (ref 65–99)
Glucose-Capillary: 142 mg/dL — ABNORMAL HIGH (ref 65–99)

## 2018-04-01 LAB — URINE CULTURE

## 2018-04-01 LAB — BASIC METABOLIC PANEL
Anion gap: 7 (ref 5–15)
BUN: 8 mg/dL (ref 6–20)
CO2: 23 mmol/L (ref 22–32)
CREATININE: 0.92 mg/dL (ref 0.44–1.00)
Calcium: 8.7 mg/dL — ABNORMAL LOW (ref 8.9–10.3)
Chloride: 109 mmol/L (ref 101–111)
GFR calc Af Amer: >60 mL/min (ref >60)
GFR calc non Af Amer: >60 mL/min (ref >60)
Glucose, Bld: 128 mg/dL — ABNORMAL HIGH (ref 65–99)
Potassium: 4.4 mmol/L (ref 3.5–5.1)
Sodium: 139 mmol/L (ref 135–145)

## 2018-04-01 MED ORDER — METOPROLOL SUCCINATE ER 50 MG PO TB24
100.0000 mg | ORAL_TABLET | Freq: Every day | ORAL | Status: DC
  Administered 2018-04-01 – 2018-04-02 (×2): 100 mg via ORAL
  Filled 2018-04-01 (×2): qty 2

## 2018-04-01 MED ORDER — MAGNESIUM OXIDE 400 (241.3 MG) MG PO TABS
400.0000 mg | ORAL_TABLET | Freq: Three times a day (TID) | ORAL | Status: DC
  Administered 2018-04-01 (×2): 400 mg via ORAL
  Filled 2018-04-01 (×3): qty 1

## 2018-04-01 MED ORDER — MAGNESIUM SULFATE 2 GM/50ML IV SOLN: 2.0000 g | Freq: Once | INTRAVENOUS | Status: DC

## 2018-04-01 NOTE — Progress Notes (Signed)
Potassium 4.1 magnesium 1.3 we will give mag sulfate 2 g IV piggyback likewise increase somatic oxide to 400 mg before meals 3 times daily and at bedtime check be met and magnesium in a.m. ACHAIA GARLOCK OIZ:[id number] DOB: Jul 18, 1951 DOA: 03/30/2018 PCP: Lucia Gaskins, MD   Physical Exam: Blood pressure (!) 147/86, pulse (!) 104, temperature 97.8 F (36.6 C), temperature source Oral, resp. rate 14, height 5' 7.5" (1.715 m), weight 124.4 kg (274 lb 4 oz), SpO2 99 %.  Lungs clear to A&P no rales wheezes rhonchi heart regular rhythm no S3-S4 no heaves thrills or rubs   Investigations:  Recent Results (from the past 240 hour(s))  Urine culture     Status: Abnormal   Collection Time: 03/30/18  2:57 PM  Result Value Ref Range Status   Specimen Description   Final    URINE, RANDOM Performed at [facility], [address], [place], [place] [address]    Special Requests   Final    NONE Performed at [facility], [address], [place], [place] [address]    Culture MULTIPLE SPECIES PRESENT, SUGGEST RECOLLECTION (A)  Final   Report Status 04/01/2018 FINAL  Final  Culture, blood (routine x 2)     Status: None (Preliminary result)   Collection Time: 03/30/18  3:24 PM  Result Value Ref Range Status   Specimen Description RIGHT ANTECUBITAL  Final   Special Requests   Final    BOTTLES DRAWN AEROBIC AND ANAEROBIC Blood Culture adequate volume   Culture   Final    NO GROWTH 2 DAYS Performed at [facility], [address] [place], [place] [address]    Report Status PENDING  Incomplete  Culture, blood (routine x 2)     Status: None (Preliminary result)   Collection Time: 03/30/18  3:29 PM  Result Value Ref Range Status   Specimen Description BLOOD RIGHT WRIST  Final   Special Requests   Final    BOTTLES DRAWN AEROBIC AND ANAEROBIC Blood Culture adequate volume   Culture   Final    NO GROWTH 2 DAYS Performed at [facility], [address], [place], [place] [address]    Report Status  PENDING  Incomplete  MRSA PCR Screening     Status: None   Collection Time: 03/30/18 11:06 PM  Result Value Ref Range Status   MRSA by PCR NEGATIVE NEGATIVE Final    Comment:        The GeneXpert MRSA Assay (FDA approved for NASAL specimens only), is one component of a comprehensive MRSA colonization surveillance program. It is not intended to diagnose MRSA infection nor to guide or monitor treatment for MRSA infections. Performed at [facility], [address], [place],  [address]      Basic Metabolic Panel: Recent Labs    03/31/18 0420 03/31/18 1200 04/01/18 0426  NA 139  --  139  K 3.1*  --  4.4  CL 108  --  109  CO2 24  --  23  GLUCOSE 117*  --  128*  BUN 12  --  8  CREATININE 0.78  --  0.92  CALCIUM 8.3*  --  8.7*  MG  --  1.3*  --    Liver Function Tests: Recent Labs    03/30/18 1425 03/31/18 0420  AST 25 17  ALT 23 17  ALKPHOS 72 51  BILITOT 0.5 0.5  PROT 6.7 5.8*  ALBUMIN 3.6 3.1*     CBC: Recent Labs  03/30/18 1425 03/31/18 0420  WBC 5.5 5.7  NEUTROABS 3.1  --   HGB 11.2* 10.3*  HCT 33.8* 31.1*  MCV 92.1 92.6  PLT 269 238    Dg Chest 2 View  Result Date: 03/30/2018 CLINICAL DATA:  Cough, wheezing, shortness of breath and weakness EXAM: CHEST - 2 VIEW COMPARISON:  Chest x-ray of 02/03/2018 FINDINGS: No active infiltrate or effusion is seen. Mediastinal and hilar contours are unremarkable. A right-sided Port-A-Cath tip overlies the mid SVC. Mediastinal and hilar contours are unremarkable and the heart is within normal limits in size. Surgical clips overlie the left axilla. There are degenerative changes in the mid to lower thoracic spine. IMPRESSION: No active cardiopulmonary disease. Electronically Signed   By: Ivar Drape M.D.   On: 03/30/2018 15:53      Medications:   Impression:  Active Problems:   Hypertension   Anxiety   Depression   Malignant neoplasm of upper-outer quadrant of left female breast (HCC)   SVT  (supraventricular tachycardia) (HCC)     Plan: Magnesium sulfate 2 g IV piggyback increase magnesium oxide to 400 mg before meals 3 times daily and at bedtime.  Check premed and magnesium level in a.m. continue current medical therapy  Consultants: Cardiology  Procedures   Antibiotics:          Time spent: 30 minutes   LOS: 2 days   Kendan Cornforth M   04/01/2018, 12:57 PM

## 2018-04-01 NOTE — Progress Notes (Signed)
Progress Note  Patient Name: Kelli Wilson Date of Encounter: 04/01/2018  Primary Cardiologist: Emmaline Kluver  Subjective   No complaints  Inpatient Medications    Scheduled Meds: . cholecalciferol  5,000 Units Oral Daily  . enoxaparin (LOVENOX) injection  0.5 mg/kg Subcutaneous Q24H  . insulin aspart  0-9 Units Subcutaneous TID WC  . magnesium oxide  400 mg Oral BID  . metoprolol tartrate  25 mg Oral TID  . pantoprazole  40 mg Oral QHS  . pravastatin  40 mg Oral Daily   Continuous Infusions: . sodium chloride 100 mL/hr at 04/01/18 0618   PRN Meds: albuterol, ALPRAZolam, HYDROcodone-acetaminophen, loperamide, ondansetron **OR** ondansetron (ZOFRAN) IV   Vital Signs    Vitals:   04/01/18 0400 04/01/18 0500 04/01/18 0627 04/01/18 0700  BP: 126/66  131/79 125/82  Pulse: 80  80 81  Resp: (!) 5  15 16   Temp: 97.8 F (36.6 C)     TempSrc: Oral     SpO2: 93%  95% 91%  Weight:  274 lb 4 oz (124.4 kg)    Height:        Intake/Output Summary (Last 24 hours) at 04/01/2018 0852 Last data filed at 04/01/2018 0306 Gross per 24 hour  Intake 3200 ml  Output -  Net 3200 ml   Filed Weights   03/30/18 2307 03/31/18 0500 04/01/18 0500  Weight: 267 lb 3.2 oz (121.2 kg) 269 lb 6.4 oz (122.2 kg) 274 lb 4 oz (124.4 kg)    Telemetry    SR  ECG    na  Physical Exam   GEN: No acute distress.   Neck: No JVD Cardiac: RRR, no murmurs, rubs, or gallops.  Respiratory: Clear to auscultation bilaterally. GI: Soft, nontender, non-distended  MS: No edema; No deformity. Neuro:  Nonfocal  Psych: Normal affect   Labs    Chemistry Recent Labs  Lab 03/30/18 1425 03/31/18 0420 04/01/18 0426  NA 139 139 139  K 3.5 3.1* 4.4  CL 106 108 109  CO2 23 24 23   GLUCOSE 167* 117* 128*  BUN 13 12 8   CREATININE 0.85 0.78 0.92  CALCIUM 9.1 8.3* 8.7*  PROT 6.7 5.8*  --   ALBUMIN 3.6 3.1*  --   AST 25 17  --   ALT 23 17  --   ALKPHOS 72 51  --   BILITOT 0.5 0.5  --   GFRNONAA  >60 >60 >60  GFRAA >60 >60 >60  ANIONGAP 10 7 7      Hematology Recent Labs  Lab 03/30/18 1425 03/31/18 0420  WBC 5.5 5.7  RBC 3.67* 3.36*  HGB 11.2* 10.3*  HCT 33.8* 31.1*  MCV 92.1 92.6  MCH 30.5 30.7  MCHC 33.1 33.1  RDW 14.9 14.9  PLT 269 238    Cardiac Enzymes Recent Labs  Lab 03/30/18 1524  TROPONINI <0.03   No results for input(s): TROPIPOC in the last 168 hours.   BNPNo results for input(s): BNP, PROBNP in the last 168 hours.   DDimer No results for input(s): DDIMER in the last 168 hours.   Radiology    Dg Chest 2 View  Result Date: 03/30/2018 CLINICAL DATA:  Cough, wheezing, shortness of breath and weakness EXAM: CHEST - 2 VIEW COMPARISON:  Chest x-ray of 02/03/2018 FINDINGS: No active infiltrate or effusion is seen. Mediastinal and hilar contours are unremarkable. A right-sided Port-A-Cath tip overlies the mid SVC. Mediastinal and hilar contours are unremarkable and the heart is within normal limits  in size. Surgical clips overlie the left axilla. There are degenerative changes in the mid to lower thoracic spine. IMPRESSION: No active cardiopulmonary disease. Electronically Signed   By: Ivar Drape M.D.   On: 03/30/2018 15:53    Cardiac Studies    Patient Profile     Kelli Wilson is a 67 y.o. female with a hx of breast cancer who is being seen today for the evaluation of tachycardia at the request of Dr Cindie Laroche.    Assessment & Plan     1. PSVT - PSVT, from review of EKGs long RP likely atach.  - dilt drip stopped, started on lopressor 25mg  tid yesterday.  - we will consolidate her beta blocker to Toprol 100mg  daily.  - no arrhythmias since yesterday morning.    2. Hypokalemia - likely from her diarrhea associated with her ongoing chemo - K at goal, low Mg. Reported adverse reaction to magnesium sulfate IV, continue oral magnesium oxide.    [place] for discharge from cardiology standpoint, we will arrange outpatient f/u in 2 weeks.   For  questions or updates, please contact [place] Please consult [email or web address] for contact info under Cardiology/STEMI.      Merrily Pew, MD  04/01/2018, 8:52 AM

## 2018-04-01 NOTE — Care Management Note (Signed)
Case Management Note  Patient Details  Name: GEORGENE KOPPER MRN: [id number] Date of Birth: 12/10/1950  Subjective/Objective: Adm with SVT. From home alone, ind with ADL's. Reports she had her last chemotherapy session last Tuesday for breast cancer. Has cane and RW if needed. Still drives. Active with [facility], reports they are helping her to get a pump and sleeve for her lymphedema. Has PCP, insurance with prescription coverage. Get medications from [place], uses delivery system.               Action/Plan: DC home with self care.    Expected Discharge Date:    04/02/2018              Expected Discharge Plan:  Home/Self Care  In-House Referral:     Discharge planning Services  CM Consult, Other - See comment(PCP list)  Post Acute Care Choice:  NA Choice offered to:  NA  DME Arranged:    DME Agency:     HH Arranged:    HH Agency:     Status of Service:  Completed, signed off  If discussed at [employer] of Stay Meetings, dates discussed:    Additional Comments:  Iram Lundberg, Chauncey [place], RN 04/01/2018, 2:23 PM

## 2018-04-01 NOTE — Plan of Care (Signed)
Educated patient on medication.  Explained reason for cardiac monitoring.

## 2018-04-01 NOTE — Progress Notes (Signed)
Report given to 300 nurse.  Patient taken to room via wheelchair.  

## 2018-04-02 LAB — BASIC METABOLIC PANEL
ANION GAP: 9 (ref 5–15)
BUN: 9 mg/dL (ref 6–20)
CO2: 22 mmol/L (ref 22–32)
Calcium: 8.5 mg/dL — ABNORMAL LOW (ref 8.9–10.3)
Chloride: 108 mmol/L (ref 101–111)
Creatinine, Ser: 0.75 mg/dL (ref 0.44–1.00)
GFR calc Af Amer: >60 mL/min (ref >60)
Glucose, Bld: 120 mg/dL — ABNORMAL HIGH (ref 65–99)
POTASSIUM: 3.5 mmol/L (ref 3.5–5.1)
SODIUM: 139 mmol/L (ref 135–145)

## 2018-04-02 LAB — GLUCOSE, CAPILLARY: Glucose-Capillary: 104 mg/dL — ABNORMAL HIGH (ref 65–99)

## 2018-04-02 LAB — MAGNESIUM: MAGNESIUM: 1.3 mg/dL — AB (ref 1.7–2.4)

## 2018-04-02 MED ORDER — METOPROLOL SUCCINATE ER 100 MG PO TB24: 100.0000 mg | ORAL_TABLET | Freq: Every day | ORAL | 6 refills | Status: DC

## 2018-04-02 MED ORDER — MAGNESIUM OXIDE 400 (241.3 MG) MG PO TABS
400.0000 mg | ORAL_TABLET | Freq: Three times a day (TID) | ORAL | Status: DC
  Administered 2018-04-02: 400 mg via ORAL
  Filled 2018-04-02: qty 1

## 2018-04-02 MED ORDER — MAGNESIUM OXIDE 400 (241.3 MG) MG PO TABS: 400.0000 mg | ORAL_TABLET | Freq: Three times a day (TID) | ORAL | 6 refills | Status: DC

## 2018-04-02 NOTE — Discharge Summary (Signed)
Physician Discharge Summary  Kelli Wilson VQM:[id number] DOB: [birth year] DOA: 03/30/2018  PCP: Lucia Gaskins, MD  Admit date: 03/30/2018 Discharge date: 04/02/2018   Recommendations for Outpatient Follow-up:  Patient is instructed to take Toprol-XL 100 mg p.o. daily as well as to increase her magnesium oxide from 400 mg twice daily to 3 times daily to continue taking her potassium supplement follow-up in my office within 1 week's time to assess any episodes of SVT and assess electrolytes of magnesium and potassium at that time. Discharge Diagnoses:  Active Problems:   Hypertension   Anxiety   Depression   Malignant neoplasm of upper-outer quadrant of left female breast [facility])   SVT (supraventricular tachycardia) ([place])   Discharge Condition: Good  Filed Weights   03/30/18 2307 03/31/18 0500 04/01/18 0500  Weight: 121.2 kg (267 lb 3.2 oz) 122.2 kg (269 lb 6.4 oz) 124.4 kg (274 lb 4 oz)    History of present illness:  The patient is a 67 year old white female with malignant breast carcinoma who is undergoing chemotherapy on her last day of chemotherapy began to have palpitations lightheadedness was found to be in SVT in the ER this was terminated with a Cardizem infusion she was admitted to ICU troponins were negative 2D echocardiogram done 8 weeks prior reveals normal chamber dimensions and normal systolic function.  She was seen in consult by cardiology who felt the addition of Toprol 25 mg 3 times daily and then increasing to 100 mg XL daily as well as supplementation of magnesium and potassium which were both low upon admission her magnesium oxide was increased to 400 mg 3 times daily from twice daily likewise she was given a magnesium sulfate 2 g infusion while in hospital presumably her chemotherapy is creating the hypomagnesemia.  She has no evidence of ischemic heart disease and will follow my office within 1 week's time to assess electrolytes magnesium potassium and to see how  she is tolerating the Toprol-XL 100 mg daily Hospital Course:  See HPI above Procedures:    Consultations: Cardiology Discharge Instructions  Discharge Instructions    Discharge instructions   Complete by:  As directed    Discharge patient   Complete by:  As directed    Discharge disposition:  01-Home or Self Care   Discharge patient date:  04/02/2018     Allergies as of 04/02/2018      Reactions   Magnesium Sulfate Shortness Of Breath   Experienced shortness of breath and chest pain during mag sulfate infusion. -11/21/17 -gwd   Other Anaphylaxis, Other (See Comments)   Lysol and other cleaning products   Cortisone    Patient passed out    Iohexol Other (See Comments)    Code: HIVES, Desc: PT STATES SHE WAS GIVEN IV DYE AT [place] BROKE OUT IN HIVES, RASH, AND HAD TO BE HOSPITALIZED DUE TO REACTION   Ivp Dye [iodinated Diagnostic Agents] Other (See Comments)   Unknown   Januvia [sitagliptin]    Stomach pains    Latex Itching   Motrin [ibuprofen] Other (See Comments)   Unknown   Nsaids Other (See Comments)   Unknown   Statins Other (See Comments)   Unknown   Sulfa Antibiotics Other (See Comments)   Unknown   Adhesive [tape] Rash      Medication List    STOP taking these medications   FIRST-DUKES MOUTHWASH Susp   Lidocaine 5 % Crea   MAGNESIUM PO Replaced by:  magnesium oxide 400 (241.3  Mg) MG tablet   NYQUIL PO     TAKE these medications   albuterol (2.5 MG/3ML) 0.083% nebulizer solution Commonly known as:  PROVENTIL Take 2.5 mg by nebulization every 6 (six) hours as needed for wheezing or shortness of breath.   ALPRAZolam 0.5 MG tablet Commonly known as:  XANAX Take 0.5 mg by mouth 4 (four) times daily as needed for anxiety.   diphenoxylate-atropine 2.5-0.025 MG tablet Commonly known as:  LOMOTIL Take 1 tablet by mouth 4 (four) times daily as needed for diarrhea or loose stools.   HYDROcodone-acetaminophen 5-325 MG tablet Commonly known as:   NORCO/VICODIN Take 1-2 tablets by mouth every 6 (six) hours as needed for moderate pain.   hydrOXYzine 50 MG tablet Commonly known as:  ATARAX/VISTARIL Take 50 mg by mouth every other day. Every other day at bedtime and as needed   magnesium oxide 400 (241.3 Mg) MG tablet Commonly known as:  MAG-OX Take 1 tablet (400 mg total) by mouth 3 (three) times daily between meals. Replaces:  MAGNESIUM PO   metFORMIN 500 MG tablet Commonly known as:  GLUCOPHAGE Take 500 mg by mouth 2 (two) times daily with a meal.   metoprolol succinate 100 MG 24 hr tablet Commonly known as:  TOPROL-XL Take 1 tablet (100 mg total) by mouth daily. Take with or immediately following a meal.   nitroGLYCERIN 0.4 MG SL tablet Commonly known as:  NITROSTAT Place 0.4 mg under the tongue every 5 (five) minutes as needed for chest pain.   omeprazole 40 MG capsule Commonly known as:  PRILOSEC Take 40 mg every other day by mouth. At night   Potassium 99 MG Tabs Take 2 tablets by mouth daily.   pravastatin 40 MG tablet Commonly known as:  PRAVACHOL Take 40 mg by mouth daily.   promethazine 25 MG tablet Commonly known as:  PHENERGAN Take 25 mg by mouth every 6 (six) hours as needed for nausea or vomiting.   Vitamin D3 5000 units Caps Take 5,000 Units by mouth daily.      Allergies  Allergen Reactions  . Magnesium Sulfate Shortness Of Breath    Experienced shortness of breath and chest pain during mag sulfate infusion. -11/21/17 -gwd  . Other Anaphylaxis and Other (See Comments)    Lysol and other cleaning products  . Cortisone     Patient passed out   . Iohexol Other (See Comments)     Code: HIVES, Desc: PT STATES SHE WAS GIVEN IV DYE AT [facility] AND BROKE OUT IN HIVES, RASH, AND HAD TO BE HOSPITALIZED DUE TO REACTION   . Ivp Dye [Iodinated Diagnostic Agents] Other (See Comments)    Unknown  . Januvia [Sitagliptin]     Stomach pains   . Latex Itching  . Motrin [Ibuprofen] Other (See Comments)     Unknown  . Nsaids Other (See Comments)    Unknown  . Statins Other (See Comments)    Unknown  . Sulfa Antibiotics Other (See Comments)    Unknown  . Adhesive [Tape] Rash      The results of significant diagnostics from this hospitalization (including imaging, microbiology, ancillary and laboratory) are listed below for reference.    Significant Diagnostic Studies: Dg Chest 2 View  Result Date: 03/30/2018 CLINICAL DATA:  Cough, wheezing, shortness of breath and weakness EXAM: CHEST - 2 VIEW COMPARISON:  Chest x-ray of 02/03/2018 FINDINGS: No active infiltrate or effusion is seen. Mediastinal and hilar contours are unremarkable. A right-sided Port-A-Cath tip overlies  the mid SVC. Mediastinal and hilar contours are unremarkable and the heart is within normal limits in size. Surgical clips overlie the left axilla. There are degenerative changes in the mid to lower thoracic spine. IMPRESSION: No active cardiopulmonary disease. Electronically Signed   By: Ivar Drape M.D.   On: 03/30/2018 15:53    Microbiology: Recent Results (from the past 240 hour(s))  Urine culture     Status: Abnormal   Collection Time: 03/30/18  2:57 PM  Result Value Ref Range Status   Specimen Description   Final    URINE, RANDOM Performed at [facility], [address], [place], [place] [address]    Special Requests   Final    NONE Performed at [facility], [address] [place], [place] [address]    Culture MULTIPLE SPECIES PRESENT, SUGGEST RECOLLECTION (A)  Final   Report Status 04/01/2018 FINAL  Final  Culture, blood (routine x 2)     Status: None (Preliminary result)   Collection Time: 03/30/18  3:24 PM  Result Value Ref Range Status   Specimen Description RIGHT ANTECUBITAL  Final   Special Requests   Final    BOTTLES DRAWN AEROBIC AND ANAEROBIC Blood Culture adequate volume   Culture   Final    NO GROWTH 2 DAYS Performed at [facility], [address] [place], [place] [address]    Report Status  PENDING  Incomplete  Culture, blood (routine x 2)     Status: None (Preliminary result)   Collection Time: 03/30/18  3:29 PM  Result Value Ref Range Status   Specimen Description BLOOD RIGHT WRIST  Final   Special Requests   Final    BOTTLES DRAWN AEROBIC AND ANAEROBIC Blood Culture adequate volume   Culture   Final    NO GROWTH 2 DAYS Performed at [facility], [address] [place], [place] [address]    Report Status PENDING  Incomplete  MRSA PCR Screening     Status: None   Collection Time: 03/30/18 11:06 PM  Result Value Ref Range Status   MRSA by PCR NEGATIVE NEGATIVE Final    Comment:        The GeneXpert MRSA Assay (FDA approved for NASAL specimens only), is one component of a comprehensive MRSA colonization surveillance program. It is not intended to diagnose MRSA infection nor to guide or monitor treatment for MRSA infections. Performed at [facility], [address] [place], [place] [address]      Labs: Basic Metabolic Panel: Recent Labs  Lab 03/30/18 1425 03/31/18 0420 03/31/18 1200 04/01/18 0426  NA 139 139  --  139  K 3.5 3.1*  --  4.4  CL 106 108  --  109  CO2 23 24  --  23  GLUCOSE 167* 117*  --  128*  BUN 13 12  --  8  CREATININE 0.85 0.78  --  0.92  CALCIUM 9.1 8.3*  --  8.7*  MG  --   --  1.3*  --    Liver Function Tests: Recent Labs  Lab 03/30/18 1425 03/31/18 0420  AST 25 17  ALT 23 17  ALKPHOS 72 51  BILITOT 0.5 0.5  PROT 6.7 5.8*  ALBUMIN 3.6 3.1*   No results for input(s): LIPASE, AMYLASE in the last 168 hours. No results for input(s): AMMONIA in the last 168 hours. CBC: Recent Labs  Lab 03/30/18 1425 03/31/18 0420  WBC 5.5 5.7  NEUTROABS 3.1  --   HGB 11.2* 10.3*  HCT 33.8* 31.1*  MCV 92.1 92.6  PLT 269 238   Cardiac Enzymes: Recent Labs  Lab 03/30/18 1524  TROPONINI <0.03   BNP: BNP (last 3 results) No results for input(s): BNP in the last 8760 hours.  ProBNP (last 3 results) No results for input(s):  PROBNP in the last 8760 hours.  CBG: Recent Labs  Lab 03/31/18 2113 04/01/18 0750 04/01/18 1206 04/01/18 1708 04/01/18 2239  GLUCAP 140* 112* 113* 107* 142*       Signed:  Ysabel Cowgill M   Pager: [phone] 04/02/2018, 6:50 AM

## 2018-04-02 NOTE — Progress Notes (Signed)
IV removed, WNL. D/C instructions given to pt. Verbalized understanding. Pt drover herself so RN to wheel down to ER entrance.

## 2018-04-02 NOTE — Care Management Important Message (Signed)
Important Message  Patient Details  Name: ARGELIA FORMISANO MRN: [id number] Date of Birth: Nov 02, 1951   Medicare Important Message Given:  Yes    Diesha Rostad, Chauncey [place], RN 04/02/2018, 7:53 AM

## 2018-04-03 ENCOUNTER — Encounter: Payer: Self-pay | Admitting: *Deleted

## 2018-04-03 ENCOUNTER — Other Ambulatory Visit: Payer: Self-pay | Admitting: *Deleted

## 2018-04-03 NOTE — Patient Outreach (Signed)
[facility]) Care Management  04/03/2018  CHELCEE KORPI 27-Nov-1950 [id number]   Transition of care   Patient with recent inpatient admission to [facility] 5/7-5/9 with SVT, hypertension . PMHx includes but not limited to , Left breast cancer, chemotherapy,Lymphedema , anxiety.   Successful outreach call to patient, HIPAA verified. Patient discussed her recent hospital admission , reports she went for her lymphedema on 5/7 and was weak and they encouraged her to go to her doctor. Patient reports calling PCP office visit available, she went ED reports heart rate 170, patient reports she left drove home to [facility] because she had to go home and take care of some things get her phone charger, call her family. Patient reports she drove back to hospital , due to feeling weakness,as if heart beating faster.  Patient reports she feels much better , patient report she has only had diarrhea twice since last chemo treatment on  4/23. Patient reports she has felt a little nauseated on today and has taken a phenergan. Patient states she has drank 2 cups and milk has been drinking water, reports she had a good dinner on last night from drive thru diner. Patient reports her blood sugar was 111 on today.  Patient denies feeling as if heart beating faster , needs increase in weakness . Patient reports her blood pressure machine is not working and not able to buy a new now, she has used up her OTC insurance benefit for this quarter.   Patient reports she would have been out running errands if she was not waiting on a return call  from [facility], reports she needs  a re-referral to Lymphedema center since she was sick when she went to last visit. She states she as left a message at cancer center is was hoping to be able to return to Lymphedema appointment on Tuesday. Patient reports she has received a phone call that she has been approved for the Lymphedema vest. She is eager to get started back  on therapy.    Discussed transition of care program,discussed need for nurse home visit patient declines need at this time, she is ready to get back to her routine. . Patient is agreeable to continued telephone follow up calls.   Plan  Will follow patient for telephonic transition of care., plan next call in a week.   Advised patient regarding seeking medical attention for increased heart rate , increased weakness, discussed safety of driving self in emergency.  Will contact [facility] care management secretary regarding sending blood pressure monitor to patient home.    THN CM Care Plan Problem One     Most Recent Value  Care Plan Problem One  Risk for readmission related to hospital admission for SVT, weakness   Role Documenting the Problem One  Care Management [place] for Problem One  Active  THN Long Term Goal   Patient will not experience a hospital admission in the next 30 days   THN Long Term Goal Start Date  03/04/18 [goal date adjusted for continued asssessment of care progres]  Interventions for Problem One Long Term Goal  Reviewed discharge instructions, reviewed importance of taking medication as prescribed. reviewed symptoms of SVT,  fast heart beat, chest discomfort, weakness, shortness of breath dizzy and advised notifying MD   [facility] CM Short Term Goal #1   Patient will report attending all medical appointments in the next 14 days   THN CM Short Term Goal #1 Start  Date  04/03/18  THN CM Short Term Goal #1 Met Date  03/27/18  Interventions for Short Term Goal #1  Reviewed upcoming medical appointments and discussed transportation l  THN CM Short Term Goal #2   Patient will be able to reports monitoring blood pressure and heart rate  daily in the next 28 days   THN CM Short Term Goal #2 Start Date  04/03/18  [facility] CM Short Term Goal #2 Met Date  03/18/18  Interventions for Short Term Goal #2  RN requested blood pressure monitor to be sent to patient home, and follow up with  regarding being able to use.   THN CM Short Term Goal #3  Patient will be able to report drinking at least 64 oz of fluid after chemotherapy in the next 30 days    THN CM Short Term Goal #3 Start Date  03/04/18  [facility] CM Short Term Goal #3 Met Date  03/27/18      Joylene Draft, RN, [place] Management Coordinator  [phone]- Mobile [phone]- [place]

## 2018-04-04 LAB — CULTURE, BLOOD (ROUTINE X 2)
CULTURE: NO GROWTH
Culture: NO GROWTH
SPECIAL REQUESTS: ADEQUATE
Special Requests: ADEQUATE

## 2018-04-06 ENCOUNTER — Other Ambulatory Visit (HOSPITAL_COMMUNITY): Payer: Self-pay

## 2018-04-06 ENCOUNTER — Inpatient Hospital Stay (HOSPITAL_COMMUNITY): Payer: Medicare Other | Attending: Hematology and Oncology | Admitting: Hematology

## 2018-04-06 ENCOUNTER — Encounter (HOSPITAL_COMMUNITY): Payer: Self-pay | Admitting: Hematology

## 2018-04-06 VITALS — BP 123/78 | HR 98 | Temp 98.4°F | Resp 18 | Wt 267.0 lb

## 2018-04-06 DIAGNOSIS — G620 Drug-induced polyneuropathy: Secondary | ICD-10-CM | POA: Insufficient documentation

## 2018-04-06 DIAGNOSIS — Z853 Personal history of malignant neoplasm of breast: Secondary | ICD-10-CM | POA: Diagnosis not present

## 2018-04-06 DIAGNOSIS — Z171 Estrogen receptor negative status [ER-]: Secondary | ICD-10-CM

## 2018-04-06 DIAGNOSIS — I890 Lymphedema, not elsewhere classified: Secondary | ICD-10-CM

## 2018-04-06 DIAGNOSIS — C50412 Malignant neoplasm of upper-outer quadrant of left female breast: Secondary | ICD-10-CM

## 2018-04-06 DIAGNOSIS — C50912 Malignant neoplasm of unspecified site of left female breast: Secondary | ICD-10-CM

## 2018-04-06 DIAGNOSIS — R197 Diarrhea, unspecified: Secondary | ICD-10-CM | POA: Insufficient documentation

## 2018-04-06 DIAGNOSIS — C50012 Malignant neoplasm of nipple and areola, left female breast: Secondary | ICD-10-CM

## 2018-04-06 DIAGNOSIS — R112 Nausea with vomiting, unspecified: Secondary | ICD-10-CM

## 2018-04-06 MED ORDER — PROMETHAZINE HCL 25 MG PO TABS: 25.0000 mg | ORAL_TABLET | Freq: Four times a day (QID) | ORAL | 3 refills | Status: DC | PRN

## 2018-04-06 MED ORDER — MISC. DEVICES MISC: 0 refills | Status: DC

## 2018-04-06 NOTE — Progress Notes (Signed)
[name], [place] [address]   CLINIC:  Medical Oncology/Hematology  PCP:  Lucia Gaskins, [place] [address] [phone]   REASON FOR VISIT:  Follow-up for left breast cancer.  CURRENT THERAPY: Completed chemotherapy.  CANCER STAGING: Cancer Staging Malignant neoplasm of upper-outer quadrant of left female breast ([place]) Staging form: Breast, AJCC 8th Edition - Clinical: cT1c, cN0 - Unsigned - Pathologic: Stage IB (pT1c, pN0, cM0, G3, ER: Negative, PR: Negative, HER2: Negative) - Signed by Twana First, MD on 10/09/2017    INTERVAL HISTORY:  [name] 67 y.o. female returns for follow-up of left breast cancer.  She has completed chemotherapy 2 weeks ago.  She has some numbness in both feet and left hand on and off.  She complains of nausea which had set in the morning and sometimes vomiting.  This gets improved with Phenergan.  She is also having intermittent diarrhea.  Diarrhea has been more pronounced since she was started on magnesium from recent hospitalization.  She was admitted recently with SVT and was started on Toprol-XL.  Her fatigue is also improving.  No fevers or infections.   REVIEW OF SYSTEMS:  Review of Systems  Gastrointestinal: Positive for diarrhea, nausea and vomiting.  Neurological: Positive for numbness.  All other systems reviewed and are negative.    PAST MEDICAL/SURGICAL HISTORY:  Past Medical History:  Diagnosis Date  . Anxiety   . Arthritis   . Back pain   . Byssinosis ([place])    secondary to working in a [employer]  . Chest pain   . Chronic pain   . Depression   . Diabetes mellitus without complication ([place])   . Fibromyalgia   . GERD (gastroesophageal reflux disease)   . Hypercholesteremia   . Hypertension   . Knee pain   . Obesity   . Peptic ulcer    history of - normal GI studies Sept 2010   Past Surgical History:  Procedure Laterality Date  . APPENDECTOMY      . bladder rectal repair    . BREAST SURGERY Left    breast biopsy- benign  . CHOLECYSTECTOMY    . DILATION AND CURETTAGE OF UTERUS    . GANGLION CYST EXCISION Left   . MASTECTOMY MODIFIED RADICAL Left 09/15/2017   Procedure: LEFT MODIFIED RADICAL MASTECTOMY;  Surgeon: Aviva Signs, MD;  Location: [facility];  Service: General;  Laterality: Left;  . PORTACATH PLACEMENT Right 10/13/2017   Procedure: INSERTION PORT-A-CATH RIGHT SUBCLAVIAN;  Surgeon: Aviva Signs, MD;  Location: [facility];  Service: General;  Laterality: Right;  . TOTAL ABDOMINAL HYSTERECTOMY       SOCIAL HISTORY:  Social History   Socioeconomic History  . Marital status: Married    Spouse name: Not on file  . Number of children: Not on file  . Years of education: Not on file  . Highest education level: Not on file  Occupational History  . 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 Financial resource strain: Not on file  . Food insecurity:    Worry: Not on file    Inability: Not on file  . Transportation needs:    Medical: Not on file    Non-medical: Not on file  Tobacco Use  . Smoking status: Never Smoker  . Smokeless tobacco: Current User    Types: Snuff  Substance and Sexual Activity  . Alcohol use: No  .  Drug use: No  . Sexual activity: Never    Birth control/protection: Surgical  Lifestyle  . Physical activity:    Days per week: Not on file    Minutes per session: Not on file  . Stress: Not on file  Relationships  . Social connections:    Talks on phone: Not on file    Gets together: Not on file    Attends religious service: Not on file    Active member of club or organization: Not on file    Attends meetings of clubs or organizations: Not on file    Relationship status: Not on file  . Intimate partner violence:    Fear of current or ex partner: Not on file    Emotionally abused: Not on file    Physically abused: Not on file    Forced sexual  activity: Not on file  Other Topics Concern  . Not on file  Social History Narrative  . Not on file    FAMILY HISTORY:  Family History  Problem Relation Age of Onset  . Aneurysm Father   . Heart disease Father   . COPD Mother   . Diabetes Mother     CURRENT MEDICATIONS:  Outpatient Encounter Medications as of 04/06/2018  Medication Sig Note  . albuterol (PROVENTIL) (2.5 MG/3ML) 0.083% nebulizer solution Take 2.5 mg by nebulization every 6 (six) hours as needed for wheezing or shortness of breath.    . ALPRAZolam (XANAX) 0.5 MG tablet Take 0.5 mg by mouth 4 (four) times daily as needed for anxiety.    . Cholecalciferol (VITAMIN D3) 5000 units CAPS Take 5,000 Units by mouth daily.   . diphenoxylate-atropine (LOMOTIL) 2.5-0.025 MG tablet Take 1 tablet by mouth 4 (four) times daily as needed for diarrhea or loose stools.   Marland Kitchen HYDROcodone-acetaminophen (NORCO/VICODIN) 5-325 MG tablet Take 1-2 tablets by mouth every 6 (six) hours as needed for moderate pain.    . hydrOXYzine (ATARAX/VISTARIL) 50 MG tablet Take 50 mg by mouth every other day. Every other day at bedtime and as needed 02/03/2018: Takes as needed.  . magnesium oxide (MAG-OX) 400 (241.3 Mg) MG tablet Take 1 tablet (400 mg total) by mouth 3 (three) times daily between meals.   . metFORMIN (GLUCOPHAGE) 500 MG tablet Take 500 mg by mouth 2 (two) times daily with a meal.    . metoprolol succinate (TOPROL-XL) 100 MG 24 hr tablet Take 1 tablet (100 mg total) by mouth daily. Take with or immediately following a meal.   . Misc. Devices MISC Please provide the patient with one pneumatic compression pump for lymphedema treatment   . nitroGLYCERIN (NITROSTAT) 0.4 MG SL tablet Place 0.4 mg under the tongue every 5 (five) minutes as needed for chest pain. 04/03/2018: Has on hand   . omeprazole (PRILOSEC) 40 MG capsule Take 40 mg every other day by mouth. At night   . Potassium 99 MG TABS Take 2 tablets by mouth daily.   . pravastatin (PRAVACHOL)  40 MG tablet Take 40 mg by mouth daily.   . promethazine (PHENERGAN) 25 MG tablet Take 1 tablet (25 mg total) by mouth every 6 (six) hours as needed for nausea or vomiting.   . [DISCONTINUED] promethazine (PHENERGAN) 25 MG tablet Take 25 mg by mouth every 6 (six) hours as needed for nausea or vomiting.    No facility-administered encounter medications on file as of 04/06/2018.     ALLERGIES:  Allergies  Allergen Reactions  . Magnesium Sulfate Shortness  Of Breath    Experienced shortness of breath and chest pain during mag sulfate infusion. -11/21/17 -gwd  . Other Anaphylaxis and Other (See Comments)    Lysol and other cleaning products  . Cortisone     Patient passed out   . Iohexol Other (See Comments)     Code: HIVES, Desc: PT STATES SHE WAS GIVEN IV DYE AT [facility] AND BROKE OUT IN HIVES, RASH, AND HAD TO BE HOSPITALIZED DUE TO REACTION   . Ivp Dye [Iodinated Diagnostic Agents] Other (See Comments)    Unknown  . Januvia [Sitagliptin]     Stomach pains   . Latex Itching  . Motrin [Ibuprofen] Other (See Comments)    Unknown  . Nsaids Other (See Comments)    Unknown  . Statins Other (See Comments)    Unknown  . Sulfa Antibiotics Other (See Comments)    Unknown  . Adhesive [Tape] Rash     PHYSICAL EXAM:  ECOG Performance status: 1  Vitals:   04/06/18 1536  BP: 123/78  Pulse: 98  Resp: 18  Temp: 98.4 F (36.9 C)  SpO2: 96%   Filed Weights   04/06/18 1536  Weight: 267 lb (121.1 kg)    Physical Exam   LABORATORY DATA:  I have reviewed the labs as listed.  CBC    Component Value Date/Time   WBC 5.7 03/31/2018 0420   RBC 3.36 (L) 03/31/2018 0420   HGB 10.3 (L) 03/31/2018 0420   HCT 31.1 (L) 03/31/2018 0420   PLT 238 03/31/2018 0420   MCV 92.6 03/31/2018 0420   MCH 30.7 03/31/2018 0420   MCHC 33.1 03/31/2018 0420   RDW 14.9 03/31/2018 0420   LYMPHSABS 1.7 03/30/2018 1425   MONOABS 0.4 03/30/2018 1425   EOSABS 0.2 03/30/2018 1425   BASOSABS 0.1  03/30/2018 1425   CMP Latest Ref Rng & Units 04/02/2018 04/01/2018 03/31/2018  Glucose 65 - 99 mg/dL 120(H) 128(H) 117(H)  BUN 6 - 20 mg/dL '9 8 12  '$ Creatinine 0.44 - 1.00 mg/dL 0.75 0.92 0.78  Sodium 135 - 145 mmol/L 139 139 139  Potassium 3.5 - 5.1 mmol/L 3.5 4.4 3.1(L)  Chloride 101 - 111 mmol/L 108 109 108  CO2 22 - 32 mmol/L '22 23 24  '$ Calcium 8.9 - 10.3 mg/dL 8.5(L) 8.7(L) 8.3(L)  Total Protein 6.5 - 8.1 g/dL - - 5.8(L)  Total Bilirubin 0.3 - 1.2 mg/dL - - 0.5  Alkaline Phos 38 - 126 U/L - - 51  AST 15 - 41 U/L - - 17  ALT 14 - 54 U/L - - 17           ASSESSMENT & PLAN:   Malignant neoplasm of upper-outer quadrant of left female breast (HCC) 1. Stage Ib (PT1CPN0) triple negative left breast cancer: - Status post left modified radical mastectomy and lymph node dissection on 09/15/2017, pathology showing triple negative IDC, 1.8 cm, margins negative, 8 out of 8 negative lymph nodes, grade 3 -Adjuvant chemotherapy with 4 cycles of AC from 10/20/2017 through 12/23/2017, weekly paclitaxel 12 cycles completed on 03/24/2018, dose reduced after week 5 secondary to neuropathy -She has successfully completed all her planned adjuvant chemotherapy.  I have talked to her about surveillance visits once every 4 months with blood work and physical exam.  She will continue to have a mammogram of right breast, around September of this year.  She has mammograms done in [place].  She was recently admitted to the hospital with SVT and started on  Toprol-XL 100 daily and magnesium 3 times a day.  2.  Nausea: She is still having intermittent nausea and vomiting.  I have given a prescription for Phenergan with 3 refills.  It is predominantly in the mornings.  I have told her to take 1 tablet in the morning daily.  3.  IBS: She is having episodes of diarrhea for which she is taking Lomotil about 2-3 tablets/day.  4.  Neuropathy: She has intermittent numbness in both feet and left hand.  We have cut back on Taxol  dose during week 5.  She will go back to the lymphedema clinic.  She will continue port flush every 2 months.    Orders placed this encounter:  Orders Placed This Encounter  Procedures  . Cancer antigen 15-3  . CBC with Differential ([place] Only)  . CMP ([place] only)      Derek Jack, MD [place] [phone]

## 2018-04-06 NOTE — Patient Instructions (Signed)
[place] at [facility]  Discharge Instructions:  You were seen by dr. Delton Coombes today.  _______________________________________________________________  Thank you for choosing [place] at [facility] to provide your oncology and hematology care.  To afford each patient quality time with our providers, please arrive at least 15 minutes before your scheduled appointment.  You need to re-schedule your appointment if you arrive 10 or more minutes late.  We strive to give you quality time with our providers, and arriving late affects you and other patients whose appointments are after yours.  Also, if you no show three or more times for appointments you may be dismissed from the clinic.  Again, thank you for choosing [place] at [place] hope is that these requests will allow you access to exceptional care and in a timely manner. _______________________________________________________________  If you have questions after your visit, please contact our office at [phone] between the hours of 8:30 a.m. and 5:00 p.m. Voicemails left after 4:30 p.m. will not be returned until the following business day. _______________________________________________________________  For prescription refill requests, have your pharmacy contact our office. _______________________________________________________________  Recommendations made by the consultant and any test results will be sent to your referring physician. _______________________________________________________________

## 2018-04-06 NOTE — Assessment & Plan Note (Addendum)
1. Stage Ib (PT1CPN0) triple negative left breast cancer: - Status post left modified radical mastectomy and lymph node dissection on 09/15/2017, pathology showing triple negative IDC, 1.8 cm, margins negative, 8 out of 8 negative lymph nodes, grade 3 -Adjuvant chemotherapy with 4 cycles of AC from 10/20/2017 through 12/23/2017, weekly paclitaxel 12 cycles completed on 03/24/2018, dose reduced after week 5 secondary to neuropathy -She has successfully completed all her planned adjuvant chemotherapy.  I have talked to her about surveillance visits once every 4 months with blood work and physical exam.  She will continue to have a mammogram of right breast, around September of this year.  She has mammograms done in [employer].  She was recently admitted to the hospital with SVT and started on Toprol-XL 100 daily and magnesium 3 times a day.  2.  Nausea: She is still having intermittent nausea and vomiting.  I have given a prescription for Phenergan with 3 refills.  It is predominantly in the mornings.  I have told her to take 1 tablet in the morning daily.  3.  IBS: She is having episodes of diarrhea for which she is taking Lomotil about 2-3 tablets/day.  4.  Neuropathy: She has intermittent numbness in both feet and left hand.  We have cut back on Taxol dose during week 5.

## 2018-04-07 ENCOUNTER — Ambulatory Visit (HOSPITAL_COMMUNITY): Payer: Medicare Other | Admitting: Hematology

## 2018-04-09 ENCOUNTER — Other Ambulatory Visit: Payer: Self-pay | Admitting: *Deleted

## 2018-04-09 NOTE — Patient Outreach (Addendum)
[facility]) Care Management  04/09/2018  MARCELLE HEPNER 02/21/1951 [id number]   Transition of care    Patient with recent inpatient admission to [facility] 5/7-5/9 with SVT, hypertension . PMHx includes but not limited to , Left breast cancer, chemotherapy,Lymphedema , anxiety.    Successful outreach call to patient , HIPAA verified.  Patient discussed her visit to PCP on today, she reports being aggravated due to PCP discussing lowering and eventually discontinuing her xanax, but her will continue to renew her pain medication. Patient states she was upset but understood. Patient states she also had labs drawn to check her potassium level, states she asked MD for potassium prescription as she is still taking over the counter potassium.  Patient denies having nausea, complaining of not  having much appetite has only had a glass of milk today, patient states she ate 2 hotdogs on yesterday and ate the 3rd one at 2 am. Patient reports her blood sugar today was 113.   Patient reports being upset today regarding her brother out of state being sick.   Patient discussed having a fall on this week when she was leaving a food bank with a bag of potatoes in hand and bag of apples in the other and getting to place items in her trunk she had a fall, denies injury other than a scrape on her right elbow.  Patient states she had to walk a little distance to get her car and thinks that wore her out and believes this contributed to her fall.  Again discussed with patient, home visit for follow up she had declined, reports she will be out and about and doesn't see the need.  Patient states she reported this to her PCP at visit on today. Patient discussed visit to oncology doctor on this week and  discussed needing a referral to start lymphedema treatments and MD agreed to send , patient has not received call from [facility] therapy department yet and she is eager to resume therapy.   Patient denies feeling as if she is having episodes of fast heart beat, she has received blood pressure monitor via mail on today and plans to get batteries on tomorrow.   Plan  Placed call to [facility] Lymphedema department to follow up on patient referral to return to service, only able to leave a message for return call to patient and my contact information .  Fall prevention discussed.  Will plan return call to patient in the next week as part of ongoing transition of care .  Will send [facility] consent for signature.    Joylene Draft, RN, [place] Management Coordinator  [phone]- Mobile [phone]- Toll Free Main Office

## 2018-04-14 ENCOUNTER — Encounter: Payer: Self-pay | Admitting: Physician Assistant

## 2018-04-14 NOTE — Progress Notes (Signed)
Cardiology Office Note    Date:  04/15/2018  ID:  Kelli Wilson, DOB Apr 26, 1951, MRN [id number] PCP:  Lucia Gaskins, MD  Cardiologist:  Dr. Bronson Ing  Chief Complaint: f/u SVT  History of Present Illness:  Kelli Wilson is a 67 y.o. 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 Per notes has a long time history of chest pain with negative cath 2005 and nuclear stress test in 2011. From Dr Raylene Everts last clinic note Jan 2019 plans were for repeat nuclear stress test due to symptoms as well as f/u echo in 3 months but does not appear this was completed. 2D echo from 10/2017 showed EF 55-60, grade 1 DD. She was admitted earlier this month with SOB/palpitations, found to be in SVT in the ER, started on dilt gtt. CXR NAD. Per Dr. Harl Bowie this appeared to be long RP likely atrial tachycardia. Diltiazem was weaned to addition of beta blocker. She also had some hypokalemia and hypomagnesemia felt due to diarrhea associated with ongoing chemotherapy, which was repleted. Labs were pertinent for Mg 1.3 (prior adverse event to Mag Sulfate so on MagOx), K 3.5, Cr 0.75, glucose 120, TSH wnl, Hgb 10.3 (baseline 11 recently), A1C 6.7; 01/2018 - LDL 77.  She returns for followup today overall feeling better since finishing chemo recently. Her diarrhea stopped about a week ago when she self-reduced her Magnesium from TID to once a day. She has chronic unchanged dyspnea. She denies any recurrence of previous CP which prompted order for nuc in 11/2017. She believes she's had 2-3 episodes of near-syncope in the time between discharge and now. She doesn't really recall very specific details about where and when it happened, but once did happen while driving - came on like  a sensation that she might pass out but resolved very quickly without intrevention. She describes the feeling as though she bent over and stood up quickly although she wasn't doing this action when it happened. She doesn't believe any of the episodes happened during a position change. She possibly felt her heart skip a few times but not clear if there was any specific relationship to the dizziness. She's not had any sustained tachy-palpitations like what prompted the hospitalization. She has had a good day today so far.    Past Medical History:  Diagnosis Date  . Anxiety   . Arthritis   . Back pain   . Byssinosis ([place])    secondary to working in a [employer]  . Chest pain   . Chronic pain   . Depression   . Diabetes mellitus without complication ([place])   . Fibromyalgia   . GERD (gastroesophageal reflux disease)   . Hypercholesteremia   . Hypertension   . Hypokalemia   . Hypomagnesemia   . Knee pain   . Obesity   . Peptic ulcer    history of - normal GI studies Sept 2010  . PSVT (paroxysmal supraventricular tachycardia) ([place])    a. dx 03/2018 - long RP likely atrial tachycardia.    Past Surgical History:  Procedure Laterality Date  . APPENDECTOMY    . bladder rectal repair    . BREAST SURGERY Left    breast biopsy- benign  . CHOLECYSTECTOMY    . DILATION AND CURETTAGE OF UTERUS    . GANGLION  CYST EXCISION Left   . MASTECTOMY MODIFIED RADICAL Left 09/15/2017   Procedure: LEFT MODIFIED RADICAL MASTECTOMY;  Surgeon: Aviva Signs, MD;  Location: [facility];  Service: General;  Laterality: Left;  . PORTACATH PLACEMENT Right 10/13/2017   Procedure: INSERTION PORT-A-CATH RIGHT SUBCLAVIAN;  Surgeon: Aviva Signs, MD;  Location: [facility];  Service: General;  Laterality: Right;  . TOTAL ABDOMINAL HYSTERECTOMY      Current Medications: Current Meds  Medication Sig  . albuterol (PROVENTIL) (2.5 MG/3ML) 0.083% nebulizer solution Take 2.5 mg by nebulization every 6 (six) hours as needed  for wheezing or shortness of breath.   . ALPRAZolam (XANAX) 0.5 MG tablet Take 0.5 mg by mouth 4 (four) times daily as needed for anxiety.   . Cholecalciferol (VITAMIN D3) 5000 units CAPS Take 5,000 Units by mouth daily.  . diphenoxylate-atropine (LOMOTIL) 2.5-0.025 MG tablet Take 1 tablet by mouth 4 (four) times daily as needed for diarrhea or loose stools.  Marland Kitchen HYDROcodone-acetaminophen (NORCO/VICODIN) 5-325 MG tablet Take 1-2 tablets by mouth every 6 (six) hours as needed for moderate pain.   . hydrOXYzine (ATARAX/VISTARIL) 50 MG tablet Take 50 mg by mouth every other day. Every other day at bedtime and as needed  . magnesium oxide (MAG-OX) 400 (241.3 Mg) MG tablet Take 1 tablet (400 mg total) by mouth 3 (three) times daily between meals. (Patient taking differently: Take 400 mg by mouth daily. )  . metFORMIN (GLUCOPHAGE) 500 MG tablet Take 500 mg by mouth 2 (two) times daily with a meal.   . metoprolol succinate (TOPROL-XL) 100 MG 24 hr tablet Take 1 tablet (100 mg total) by mouth daily. Take with or immediately following a meal.  . Misc. Devices MISC Please provide the patient with one pneumatic compression pump for lymphedema treatment  . nitroGLYCERIN (NITROSTAT) 0.4 MG SL tablet Place 0.4 mg under the tongue every 5 (five) minutes as needed for chest pain.  Marland Kitchen omeprazole (PRILOSEC) 40 MG capsule Take 40 mg every other day by mouth. At night  . Potassium 99 MG TABS Take 2 tablets by mouth daily.  . pravastatin (PRAVACHOL) 40 MG tablet Take 40 mg by mouth daily.  . promethazine (PHENERGAN) 25 MG tablet Take 1 tablet (25 mg total) by mouth every 6 (six) hours as needed for nausea or vomiting.    Allergies:   Magnesium sulfate; Other; Cortisone; Iohexol; Ivp dye [iodinated diagnostic agents]; Januvia [sitagliptin]; Latex; Motrin [ibuprofen]; Nsaids; Statins; Sulfa antibiotics; and Adhesive [tape]   Social History   Socioeconomic History  . Marital status: Married    Spouse name: Not on file    . Number of children: Not on file  . Years of education: Not on file  . Highest education level: Not on file  Occupational History  . 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 Financial resource strain: Not on file  . Food insecurity:    Worry: Not on file    Inability: Not on file  . Transportation needs:    Medical: Not on file    Non-medical: Not on file  Tobacco Use  . Smoking status: Never Smoker  . Smokeless tobacco: Current User    Types: Snuff  Substance and Sexual Activity  . Alcohol use: No  . Drug use: No  . Sexual activity: Never    Birth control/protection: Surgical  Lifestyle  . Physical activity:    Days per week: Not on file    Minutes  per session: Not on file  . Stress: Not on file  Relationships  . Social connections:    Talks on phone: Not on file    Gets together: Not on file    Attends religious service: Not on file    Active member of club or organization: Not on file    Attends meetings of clubs or organizations: Not on file    Relationship status: Not on file  Other Topics Concern  . Not on file  Social History Narrative  . Not on file     Family History:  Family History  Problem Relation Age of Onset  . Aneurysm Father   . Heart disease Father   . COPD Mother   . Diabetes Mother    ROS:   Please see the history of present illness. All other systems are reviewed and otherwise negative.    PHYSICAL EXAM:   VS:  BP 128/78   Pulse 93   Ht 5\' 7"  (1.702 m)   Wt 266 lb (120.7 kg)   SpO2 96%   BMI 41.66 kg/m   BMI: Body mass index is 41.66 kg/m. GEN: Well nourished, well developed obese WF, in no acute distress  HEENT: normocephalic, atraumatic Neck: no JVD, carotid bruits, or masses Cardiac: RRR; no murmurs, rubs, or gallops, no edema  Respiratory:  clear to auscultation bilaterally, normal work of breathing GI: soft, nontender, nondistended, + BS MS: no deformity or atrophy   Skin: warm and dry, no rash Neuro:  Alert and Oriented x 3, Strength and sensation are intact, follows commands Psych: euthymic mood, full affect  Wt Readings from Last 3 Encounters:  04/15/18 266 lb (120.7 kg)  04/06/18 267 lb (121.1 kg)  04/01/18 274 lb 4 oz (124.4 kg)      Studies/Labs Reviewed:   EKG:  EKG was ordered today and personally reviewed by me and demonstrates NSR 91bpm no acute ST-T changes, QT 343ms  Recent Labs: 03/31/2018: ALT 17; Hemoglobin 10.3; Platelets 238; TSH 1.613 04/02/2018: BUN 9; Creatinine, Ser 0.75; Magnesium 1.3; Potassium 3.5; Sodium 139   Lipid Panel    Component Value Date/Time   CHOL 166 02/16/2018 1122   TRIG 196 (H) 02/16/2018 1122   HDL 50 02/16/2018 1122   CHOLHDL 3.3 02/16/2018 1122   VLDL 39 02/16/2018 1122   LDLCALC 77 02/16/2018 1122    Additional studies/ records that were reviewed today include: Summarized above.    ASSESSMENT & PLAN:   1. Paroxysmal SVT - quiescent on Toprol. EKG NSR today. 2. Near syncope - vague, lasted a few seconds, possibly associated with palpitations but not clear.  At [place] 11/2017 Dr. Bronson Ing had recommended an echo in 3 months given adriamycin exposure with chemotherapy. Will go ahead and obtain this to update LV function. Will hold off stress testing since chest pain has resolved, but can consider further testing if needed if echo is abnormal. Will check lytes. Will also obtain 30 day event monitor to reassess for rhythm disturbance possibly contributing to symptoms. If this is unrevealing and symptoms persist, may need to consider neuro imaging. 3. Essential HTN - controlled, follow. 4. Hypomagnesemia - recheck today. 5. Hypokalemia - recheck today.  Disposition: F/u with Dr. Bronson Ing in 6 months.   Medication Adjustments/Labs and Tests Ordered: Current medicines are reviewed at length with the patient today.  Concerns regarding medicines are outlined above. Medication changes, Labs and Tests  ordered today are summarized above and listed in the Patient Instructions accessible in  Encounters.   Signed, Charlie Pitter, PA-C  04/15/2018 2:47 PM    [facility] Location in [place], [place] [address] Ph: [phone]; Fax [phone]

## 2018-04-15 ENCOUNTER — Other Ambulatory Visit (HOSPITAL_COMMUNITY)
Admission: RE | Admit: 2018-04-15 | Discharge: 2018-04-15 | Disposition: A | Discharge status: Home / Self Care | Payer: Medicare Other | Source: Ambulatory Visit | Attending: Physician Assistant | Admitting: Physician Assistant

## 2018-04-15 ENCOUNTER — Ambulatory Visit (INDEPENDENT_AMBULATORY_CARE_PROVIDER_SITE_OTHER): Payer: Medicare Other | Admitting: Physician Assistant

## 2018-04-15 ENCOUNTER — Encounter: Payer: Self-pay | Admitting: Physician Assistant

## 2018-04-15 VITALS — BP 128/78 | HR 93 | Ht 67.0 in | Wt 266.0 lb

## 2018-04-15 DIAGNOSIS — I10 Essential (primary) hypertension: Secondary | ICD-10-CM

## 2018-04-15 DIAGNOSIS — R55 Syncope and collapse: Secondary | ICD-10-CM

## 2018-04-15 DIAGNOSIS — I471 Supraventricular tachycardia: Secondary | ICD-10-CM

## 2018-04-15 DIAGNOSIS — E876 Hypokalemia: Secondary | ICD-10-CM | POA: Insufficient documentation

## 2018-04-15 LAB — CBC
HCT: 39.3 % (ref 36.0–46.0)
Hemoglobin: 12.7 g/dL (ref 12.0–15.0)
MCH: 29.8 pg (ref 26.0–34.0)
MCHC: 32.3 g/dL (ref 30.0–36.0)
MCV: 92.3 fL (ref 78.0–100.0)
PLATELETS: 327 10*3/uL (ref 150–400)
RBC: 4.26 MIL/uL (ref 3.87–5.11)
RDW: 14 % (ref 11.5–15.5)
WBC: 11.7 10*3/uL — AB (ref 4.0–10.5)

## 2018-04-15 LAB — BASIC METABOLIC PANEL
Anion gap: 10 (ref 5–15)
BUN: 10 mg/dL (ref 6–20)
CO2: 25 mmol/L (ref 22–32)
CREATININE: 0.96 mg/dL (ref 0.44–1.00)
Calcium: 9.3 mg/dL (ref 8.9–10.3)
Chloride: 100 mmol/L — ABNORMAL LOW (ref 101–111)
GFR, EST NON AFRICAN AMERICAN: 59 mL/min — AB (ref >60)
Glucose, Bld: 132 mg/dL — ABNORMAL HIGH (ref 65–99)
POTASSIUM: 3.7 mmol/L (ref 3.5–5.1)
SODIUM: 135 mmol/L (ref 135–145)

## 2018-04-15 NOTE — Patient Instructions (Addendum)
Your physician wants you to follow-up in:  6 months with Dr.Koneswaran You will receive a reminder letter in the mail two months in advance. If you don't receive a letter, please call our office to schedule the follow-up appointment.     Your physician has requested that you have an echocardiogram. Echocardiography is a painless test that uses sound waves to create images of your heart. It provides your doctor with information about the size and shape of your heart and how well your heart's chambers and valves are working. This procedure takes approximately one hour. There are no restrictions for this procedure.     Your physician has recommended that you wear an event monitor for 30 days. Event monitors are medical devices that record the heart's electrical activity. Doctors most often [place] these monitors to diagnose arrhythmias. Arrhythmias are problems with the speed or rhythm of the heartbeat. The monitor is a small, portable device. You can wear one while you do your normal daily activities. This is usually used to diagnose what is causing palpitations/syncope (passing out).     Get lab work today: CBC,BMET Magnesium   Your physician recommends that you continue on your current medications as directed. Please refer to the Current Medication list given to you today.     If you need a refill on your cardiac medications before your next appointment, please call your pharmacy.    Thank you for choosing [facility] !

## 2018-04-16 ENCOUNTER — Telehealth: Payer: Self-pay

## 2018-04-16 ENCOUNTER — Other Ambulatory Visit: Payer: Self-pay | Admitting: *Deleted

## 2018-04-16 LAB — MAGNESIUM: Magnesium: 1.7 mg/dL (ref 1.7–2.4)

## 2018-04-16 MED ORDER — POTASSIUM CHLORIDE CRYS ER 20 MEQ PO TBCR: 40.0000 meq | EXTENDED_RELEASE_TABLET | Freq: Every day | ORAL | 3 refills | Status: DC

## 2018-04-16 NOTE — Telephone Encounter (Signed)
-----   Message from Charlie Pitter, [place] sent at 04/16/2018  8:02 AM EDT ----- See other result note. Please let patient know electrolytes are improving. Instead of the OTC potassium I would suggest KCl 55meq daily. She did not tolerate higher doses of Magnesium due to diarrhea, so would have her increase dietary intake of healthy sources of magnesium/potassium including bananas, squash, yogurt, leafy greens, nuts, seeds, fish, beans, whole grains, avocados, and bananas. Recommend further monitoring of electrolytes by PCP Melina Copa PA-C

## 2018-04-16 NOTE — Telephone Encounter (Signed)
I spoke with patient, she will stop OTC potassium, begin KCL 40 meq daily, copied results to pcp

## 2018-04-16 NOTE — Patient Outreach (Signed)
[place] [facility]) Care Management  04/16/2018  Kelli Wilson 1951/04/22 [id number]   Patient with recent inpatient admission to [facility] 5/7-5/9 with SVT, hypertension . PMHx includes but not limited to , Left breast cancer, chemotherapy,Lymphedema , anxiety.   Successful outreach call to patient , HIPAA verified. Patient reports feeling a little tired on today, she has been out driving neighbor to medical appointment.   Patient discussed her recent office visit to cardiology, discussed she explained to them she had recent spell where she felt like she might pass out states it lasted only a few seconds, last episode earlier this week, states she checked her blood sugar after one episode and it was 85. Patient denies having any episodes since then. She read back her recent blood pressure readings 137-144/80-90 range, with heart rate 85-95, denies feeling as if heart beating fast.  Patient discussed call from cardiology office regarding new prescription for potassium instead of the over the counter potassium she is taking, she will have her pharmacy deliver. Patient discussed upcoming plan for 30 day monitor and 2d echo.   Patient reports she continues to visit food banks throughout the week.  Patient states her appetite is no good on today,nothing really taste good.  complained on nausea, has taking medication, also complains of diarrhea at least 5 small amounts,  but has not taken lomotil yet due to being away from home some on today, encouraged to take medication if diarrhea persist.  Patient reports she had 2 hotdogs on yesterday and some clam chowder, discussed trying less spicy foods.   Patient discussed her main concern is being able to return to Lymphedema therapy at [facility], states she has not received a return call back yet. Patient reports [name] has provided $200 toward  copayments to [facility] for therapy and patient reports she received check  from another foundation to use toward [employer].   Plan Placed call to [facility], Lymphedema clinic spoke with Gari Crown, that states she will call patient to get her scheduled, received return call from patient states she is scheduled for 2 visits on next week Tuesday and Thursday and she is excited.  Will plan transition of care outreach call in the next week. Will send patient consent to receive  [facility] consent in writing  as verbal consent has already been obtained.   Joylene Draft, RN, [place] Management Coordinator  [phone]- Mobile [phone]- Toll Free Main Office

## 2018-04-21 ENCOUNTER — Ambulatory Visit (INDEPENDENT_AMBULATORY_CARE_PROVIDER_SITE_OTHER): Payer: Medicare Other

## 2018-04-21 ENCOUNTER — Telehealth: Payer: Self-pay | Admitting: Cardiovascular Disease

## 2018-04-21 DIAGNOSIS — R55 Syncope and collapse: Secondary | ICD-10-CM | POA: Diagnosis not present

## 2018-04-21 DIAGNOSIS — I471 Supraventricular tachycardia: Secondary | ICD-10-CM

## 2018-04-21 NOTE — Telephone Encounter (Signed)
Thanks for the update. Please review [employer]ardia smartphone app as a potential alternative to monitoring if she has future episodes of dizziness or palpitations. Payzlee Ryder PA-C

## 2018-04-21 NOTE — Telephone Encounter (Signed)
Pt received her monitor on Friday, she wore it for 3 days and the stickers broke her out and made her itch, she sent it back and does not want to wear another one again.

## 2018-04-21 NOTE — Telephone Encounter (Signed)
Will forward to [employer], PA as in [place].

## 2018-04-22 ENCOUNTER — Other Ambulatory Visit: Payer: Self-pay | Admitting: *Deleted

## 2018-04-22 ENCOUNTER — Other Ambulatory Visit (HOSPITAL_COMMUNITY): Payer: Medicare Other

## 2018-04-22 ENCOUNTER — Encounter: Payer: Self-pay | Admitting: *Deleted

## 2018-04-22 NOTE — Patient Outreach (Signed)
[place] [facility]) Care Management  04/22/2018  Kelli Wilson 1951-08-04 [id number]  Transition of care call  Successful outreach call to patient, HIPAA verified.  Patient discussed feeling better on today , reports having a little dizzy spell yesterday morning , she thinks it was related to maybe her blood sugar being a little low, she felt better after eating but did not check her blood sugar at that time. Patient reports blood sugar was 139 on today, reinforced rule of 15 and rechecking blood sugar with symptoms.  Reports one episode of blood pressure of 142/82 and heart rate 107 on this week, but denies feeling as if heart beating faster at this time.   Patient reports feeling cold and having chills on last night, 98.6, denies cough , or burning with urinating or change in color or odor of urine. Patient discussed recent lab work done at cardiology visit that was called to her with potassium level and states her WBC was a little elevated and result have been sent to her PCP, patient states that she has not heard from that office.  Patient discussed trying to wear cardiac event monitor , states she was to change pads every 3 days, and when she changed them yesterday , she had a skin rash, states she was not interested in trying a different patch or monitor at this time, states she went by cardiology to show them, and then sent the monitor back  Patient denies having a recent fall, reports her little toe is black and blue from bumping it in home, reports she does not wear shoes in the home, encouraged to wear support shoes in home to protect feet.   Patient reports attending lymphedema therapy on yesterday and has visit on today, reports they measured her again at visit and it is not time yet for sleeve.  Patient states she received a call from [employer] about her monthly payment of $94, she expressed to them that it not what she agreed to and to , she is able to pay $25, states she  told them to send her bills to hospital where she can pay less what she can afford, she requested to not be in [employer] [facility] program. Patient reports she has filling out paperwork  for [facility] program at hospital she is just waiting on paperwork from IRS to turn in.   Patient denies any other concerns at this time, plans to attend appointment for 2 D echo on 5/31.   Plan Will continue weekly transition of care outreach calls, as patient is followed telephonically only per request.  Placed call to [place] regarding patient concern related to reported elevated WBC ( 11.7 on 5/22) from recent lab work, message received that office is closed, able to leave a message requesting return call to patient, also my contact number.    Joylene Draft, RN, [place] Management Coordinator  [phone]- Mobile [phone]- Toll Free Main Office

## 2018-04-23 ENCOUNTER — Other Ambulatory Visit: Payer: Self-pay | Admitting: Licensed Clinical Social Worker

## 2018-04-23 NOTE — Patient Outreach (Signed)
Assessment:  CSW spoke via phone with client. . CSW verified client identity. CSW received verbal permission  from client on 04/23/18 for CSW to speak with client about client needs. CSW spoke with client about client care plan. CSW encouraged client to speak with CSW in next 30 days about financial resources for client in the community. Client has said that she has financial challenges. CSW again encouraged client to go to [employer] for food support. Client said she sometimes also gets food support from [employer].  CSW informed client about Neuro[facility] in [place] which assists with financial counseling and budget management for clients. Client said she had spoken today with representative at [facility] related to application for financial help related to client's hospital bills due. CSW thanked client for phone call with CSW on 04/23/18. Client was appreciative of phone call from [place] on 04/23/18.    Plan:  Client to communicate with CSW in next 30 days about financial resources for client in the community.   CSW to call client in 4 weeks to assess needs of client at that time.   Norva Riffle.Baneen Wieseler MSW, LCSW Licensed Clinical Social Worker [facility] Care Management [phone]

## 2018-04-24 ENCOUNTER — Ambulatory Visit (HOSPITAL_COMMUNITY)
Admission: RE | Admit: 2018-04-24 | Discharge: 2018-04-24 | Disposition: A | Discharge status: Home / Self Care | Payer: Medicare Other | Source: Ambulatory Visit | Attending: Physician Assistant | Admitting: Physician Assistant

## 2018-04-24 DIAGNOSIS — I119 Hypertensive heart disease without heart failure: Secondary | ICD-10-CM | POA: Insufficient documentation

## 2018-04-24 DIAGNOSIS — K219 Gastro-esophageal reflux disease without esophagitis: Secondary | ICD-10-CM | POA: Diagnosis not present

## 2018-04-24 DIAGNOSIS — R55 Syncope and collapse: Secondary | ICD-10-CM | POA: Insufficient documentation

## 2018-04-24 DIAGNOSIS — I080 Rheumatic disorders of both mitral and aortic valves: Secondary | ICD-10-CM | POA: Diagnosis not present

## 2018-04-24 DIAGNOSIS — E7800 Pure hypercholesterolemia, unspecified: Secondary | ICD-10-CM | POA: Diagnosis not present

## 2018-04-24 DIAGNOSIS — Z853 Personal history of malignant neoplasm of breast: Secondary | ICD-10-CM | POA: Insufficient documentation

## 2018-04-24 DIAGNOSIS — I471 Supraventricular tachycardia: Secondary | ICD-10-CM | POA: Insufficient documentation

## 2018-04-24 NOTE — Progress Notes (Signed)
*  PRELIMINARY RESULTS* Echocardiogram 2D Echocardiogram has been performed.  Kelli Wilson 04/24/2018, 12:04 PM

## 2018-04-29 ENCOUNTER — Other Ambulatory Visit: Payer: Self-pay | Admitting: *Deleted

## 2018-04-29 ENCOUNTER — Encounter: Payer: Self-pay | Admitting: *Deleted

## 2018-04-29 NOTE — Patient Outreach (Signed)
[place] [facility]) Care Management  04/29/2018  Kelli Wilson 02-05-1951 [id number]   Patient with recent inpatient admission to [facility] 5/7-5/9 with SVT, hypertension . PMHx includes but not limited to , Left breast cancer, chemotherapy,Lymphedema , anxiety.   1125 Unsuccessful outreach call to patient , able to leave a HIPAA compliant message for return call.   1530 Unsuccessful call attempt, no answer able to leave a HIPAA compliant message for return call.    Plan  Will send unsuccessful outreach letter and plan return call in the next 4 days if no response from patient .    Joylene Draft, RN, [place] Management Coordinator  [phone]- Mobile [phone]- Toll Free Main Office

## 2018-05-04 ENCOUNTER — Other Ambulatory Visit: Payer: Self-pay | Admitting: *Deleted

## 2018-05-04 NOTE — Patient Outreach (Signed)
[place] [facility]) Care Management  05/04/2018  Kelli Wilson 09-03-1951 [id number]   Final transition of care call.    Successful outreach call to patient , HIPAA verified.  Patient reports that she is doing pretty good, , she discussed getting report regarding echo test, everything was pretty good and she has been encouraged regarding adhering to 2 Gm sodium diet, patient discussed she has non salt canned food from food bank, reviewed reading label for sodium content and foods with higher salt such as hots dogs that she eats at times,offfered to provide low salt diet written information she declined. Patient discussed she has a follow up visit with PCP on this week.    Patient discussed that she has been able to attend [employer] support groups.  She reports attending twice weekly, lymphedema sessions and has received a sleeve for a arm to wear daily. Patient discussed so far she has finances previously received through [employer] and [employer] for copayment.   Patient denies any new concerns at this time, discussed possible case closure at next telephonic call if no new concerns    Plan Will plan call in the next month and case closure if no new concerns or care coordination needs.   Joylene Draft, RN, [place] Management Coordinator  [phone]- Mobile [phone]- Toll Free Main Office

## 2018-05-07 ENCOUNTER — Telehealth: Payer: Self-pay | Admitting: *Deleted

## 2018-05-07 NOTE — Telephone Encounter (Signed)
Called patient with test results. No answer. Left message to call back.  

## 2018-05-07 NOTE — Telephone Encounter (Signed)
-----   Message from Charlie Pitter, PA-C sent at 05/06/2018 11:23 AM EDT ----- Pt did not wish to wear monitor for duration. Only NSR on tracings obtained, can let pt know. Dayna Dunn PA-C

## 2018-05-20 ENCOUNTER — Ambulatory Visit (INDEPENDENT_AMBULATORY_CARE_PROVIDER_SITE_OTHER): Payer: Medicare Other

## 2018-05-20 ENCOUNTER — Encounter: Payer: Self-pay | Admitting: Orthopaedic Surgery

## 2018-05-20 ENCOUNTER — Ambulatory Visit: Payer: Medicare Other | Admitting: Orthopaedic Surgery

## 2018-05-20 VITALS — BP 144/76 | HR 102 | Ht 67.5 in | Wt 268.0 lb

## 2018-05-20 DIAGNOSIS — M79641 Pain in right hand: Secondary | ICD-10-CM | POA: Diagnosis not present

## 2018-05-20 DIAGNOSIS — M25551 Pain in right hip: Secondary | ICD-10-CM | POA: Diagnosis not present

## 2018-05-20 DIAGNOSIS — Z6841 Body Mass Index (BMI) 40.0 and over, adult: Secondary | ICD-10-CM | POA: Diagnosis not present

## 2018-05-20 DIAGNOSIS — G8929 Other chronic pain: Secondary | ICD-10-CM | POA: Diagnosis not present

## 2018-05-20 DIAGNOSIS — M5441 Lumbago with sciatica, right side: Secondary | ICD-10-CM

## 2018-05-20 NOTE — Progress Notes (Signed)
Patient Kelli Wilson, female DOB:Dec 20, 1950, 67 y.o. EUM:[id number]  Chief Complaint  Patient presents with  . Back Pain    lumbar     HPI  Kelli Wilson is a 67 y.o. female who has lower back pain chronically.  She has had a slight flare up recently.  She has had breast cancer surgery since I last saw her and she is getting chemotherapy.  She has lymphedema of the left upper extremity secondary to the surgery.  She has developed pain of the hands, of the DIP joints and the IP of the right hand dominant side.  She has no trauma, no redness.  She has pain of the right hip.  It hurts to stand a while and it hurts to move her feet externally.  She has no trauma.  She cannot take NSAIDs. She has taken Tylenol.  HPI  Body mass index is 41.36 kg/m.  ROS  Review of Systems  Constitutional:       She has diabetes diet controlled. She has COPD  HENT: Positive for congestion.   Respiratory: Positive for shortness of breath. Negative for cough.   Cardiovascular: Negative for chest pain and leg swelling.  Endocrine: Positive for cold intolerance.  Musculoskeletal: Positive for arthralgias, back pain and myalgias.  Allergic/Immunologic: Positive for environmental allergies and food allergies.  Psychiatric/Behavioral: The patient is nervous/anxious.     Past Medical History:  Diagnosis Date  . Anxiety   . Arthritis   . Back pain   . Byssinosis ([place])    secondary to working in a [employer]  . Chest pain   . Chronic pain   . Depression   . Diabetes mellitus without complication ([place])   . Fibromyalgia   . GERD (gastroesophageal reflux disease)   . Hypercholesteremia   . Hypertension   . Hypokalemia   . Hypomagnesemia   . Knee pain   . Obesity   . Peptic ulcer    history of - normal GI studies Sept 2010  . PSVT (paroxysmal supraventricular tachycardia) ([place])    a. dx 03/2018 - long RP likely atrial tachycardia.    Past Surgical History:  Procedure Laterality  Date  . APPENDECTOMY    . bladder rectal repair    . BREAST SURGERY Left    breast biopsy- benign  . CHOLECYSTECTOMY    . DILATION AND CURETTAGE OF UTERUS    . GANGLION CYST EXCISION Left   . MASTECTOMY MODIFIED RADICAL Left 09/15/2017   Procedure: LEFT MODIFIED RADICAL MASTECTOMY;  Surgeon: Aviva Signs, MD;  Location: [facility];  Service: General;  Laterality: Left;  . PORTACATH PLACEMENT Right 10/13/2017   Procedure: INSERTION PORT-A-CATH RIGHT SUBCLAVIAN;  Surgeon: Aviva Signs, MD;  Location: [facility];  Service: General;  Laterality: Right;  . TOTAL ABDOMINAL HYSTERECTOMY      Family History  Problem Relation Age of Onset  . Aneurysm Father   . Heart disease Father   . COPD Mother   . Diabetes Mother     Social History Social History   Tobacco Use  . Smoking status: Never Smoker  . Smokeless tobacco: Current User    Types: Snuff  Substance Use Topics  . Alcohol use: No  . Drug use: No    Allergies  Allergen Reactions  . Magnesium Sulfate Shortness Of Breath    Experienced shortness of breath and chest pain during mag sulfate infusion. -11/21/17 -gwd  . Other Anaphylaxis and Other (See Comments)    Lysol and  other cleaning products  . Cortisone     Patient passed out   . Iohexol Other (See Comments)     Code: HIVES, Desc: PT STATES SHE WAS GIVEN IV DYE AT [facility] AND BROKE OUT IN HIVES, RASH, AND HAD TO BE HOSPITALIZED DUE TO REACTION   . Ivp Dye [Iodinated Diagnostic Agents] Other (See Comments)    Unknown  . Januvia [Sitagliptin]     Stomach pains   . Latex Itching  . Motrin [Ibuprofen] Other (See Comments)    Unknown  . Nsaids Other (See Comments)    Unknown  . Statins Other (See Comments)    Unknown  . Sulfa Antibiotics Other (See Comments)    Unknown  . Adhesive [Tape] Rash    Current Outpatient Medications  Medication Sig Dispense Refill  . albuterol (PROVENTIL) (2.5 MG/3ML) 0.083% nebulizer solution Take 2.5 mg by nebulization every 6 (six)  hours as needed for wheezing or shortness of breath.     . ALPRAZolam (XANAX) 0.5 MG tablet Take 0.5 mg by mouth 4 (four) times daily as needed for anxiety.     . Cholecalciferol (VITAMIN D3) 5000 units CAPS Take 5,000 Units by mouth daily.    . diphenoxylate-atropine (LOMOTIL) 2.5-0.025 MG tablet Take 1 tablet by mouth 4 (four) times daily as needed for diarrhea or loose stools. 30 tablet 0  . HYDROcodone-acetaminophen (NORCO/VICODIN) 5-325 MG tablet Take 1-2 tablets by mouth every 6 (six) hours as needed for moderate pain.   0  . hydrOXYzine (ATARAX/VISTARIL) 50 MG tablet Take 50 mg by mouth every other day. Every other day at bedtime and as needed    . magnesium oxide (MAG-OX) 400 (241.3 Mg) MG tablet Take 1 tablet (400 mg total) by mouth 3 (three) times daily between meals. (Patient taking differently: Take 400 mg by mouth daily. ) 90 tablet 6  . metFORMIN (GLUCOPHAGE) 500 MG tablet Take 500 mg by mouth 2 (two) times daily with a meal.     . metoprolol succinate (TOPROL-XL) 100 MG 24 hr tablet Take 1 tablet (100 mg total) by mouth daily. Take with or immediately following a meal. 30 tablet 6  . Misc. Devices MISC Please provide the patient with one pneumatic compression pump for lymphedema treatment 1 each 0  . nitroGLYCERIN (NITROSTAT) 0.4 MG SL tablet Place 0.4 mg under the tongue every 5 (five) minutes as needed for chest pain.    Marland Kitchen omeprazole (PRILOSEC) 40 MG capsule Take 40 mg every other day by mouth. At night    . potassium chloride SA (KLOR-CON M20) 20 MEQ tablet Take 2 tablets (40 mEq total) by mouth daily. 180 tablet 3  . pravastatin (PRAVACHOL) 40 MG tablet Take 40 mg by mouth daily.    . promethazine (PHENERGAN) 25 MG tablet Take 1 tablet (25 mg total) by mouth every 6 (six) hours as needed for nausea or vomiting. 30 tablet 3   No current facility-administered medications for this visit.      Physical Exam  Blood pressure (!) 144/76, pulse (!) 102, height 5' 7.5" (1.715 m),  weight 268 lb (121.6 kg).  Constitutional: overall normal hygiene, normal nutrition, well developed, normal grooming, normal body habitus. Assistive device:lymphedema hand support left upper extremity.  Musculoskeletal: gait and station Limp right, muscle tone and strength are normal, no tremors or atrophy is present.  .  Neurological: coordination overall normal.  Deep tendon reflex/nerve stretch intact.  Sensation normal.  Cranial nerves II-XII intact.   Skin:  Normal overall no scars, lesions, ulcers or rashes. No psoriasis.  Psychiatric: Alert and oriented x 3.  Recent memory intact, remote memory unclear.  Normal mood and affect. Well groomed.  Good eye contact.  Cardiovascular: overall no swelling, no varicosities, no edema bilaterally, normal temperatures of the legs and arms, no clubbing, cyanosis and good capillary refill.  Lymphatic: palpation is normal.  Spine/Pelvis examination:  Inspection:  Overall, sacoiliac joint benign and hips nontender; without crepitus or defects.   Thoracic spine inspection: Alignment normal without kyphosis present   Lumbar spine inspection:  Alignment  with normal lumbar lordosis, without scoliosis apparent.   Thoracic spine palpation:  without tenderness of spinal processes   Lumbar spine palpation: without tenderness of lumbar area; without tightness of lumbar muscles    Range of Motion:   Lumbar flexion, forward flexion is normal without pain or tenderness    Lumbar extension is full without pain or tenderness   Left lateral bend is normal without pain or tenderness   Right lateral bend is normal without pain or tenderness   Straight leg raising is normal  Strength & tone: normal   Stability overall normal stability  Her right hip is tender with external rotation.  NV intact.  External rotation 20, internal 30, flexion 90, abduction and adduction full, extension full.  NV Intact.  Limp to the right.  Hands have some tenderness of the  DIP joints with no redness. ROM is full. Thumb is tender over the IP joint.  NV Intact.  There is no redness.    All other systems reviewed and are negative   The patient has been educated about the nature of the problem(s) and counseled on treatment options.  The patient appeared to understand what I have discussed and is in agreement with it.  Encounter Diagnoses  Name Primary?  . Pain of right hip joint Yes  . Hand pain, right   . Chronic right-sided low back pain with right-sided sciatica   . Body mass index 40.0-44.9, adult ([place])   . Morbid obesity due to excess calories ([facility])    X-rays were done of the right hand and right hip, reported separately.  PLAN Call if any problems.  Precautions discussed.  Continue current medications.   Return to clinic 1 month   Use BioFreeze or Aspercreme to the fingers.  Electronically Signed Sanjuana Kava, MD 6/26/20192:47 PM

## 2018-05-25 ENCOUNTER — Other Ambulatory Visit: Payer: Self-pay | Admitting: Licensed Clinical Social Worker

## 2018-05-25 DIAGNOSIS — I890 Lymphedema, not elsewhere classified: Secondary | ICD-10-CM | POA: Diagnosis not present

## 2018-05-25 DIAGNOSIS — Z853 Personal history of malignant neoplasm of breast: Secondary | ICD-10-CM | POA: Diagnosis not present

## 2018-05-25 DIAGNOSIS — M797 Fibromyalgia: Secondary | ICD-10-CM | POA: Diagnosis not present

## 2018-05-25 DIAGNOSIS — E119 Type 2 diabetes mellitus without complications: Secondary | ICD-10-CM | POA: Diagnosis not present

## 2018-05-25 DIAGNOSIS — J449 Chronic obstructive pulmonary disease, unspecified: Secondary | ICD-10-CM | POA: Diagnosis not present

## 2018-05-25 NOTE — Patient Outreach (Signed)
Assessment:  CSW spoke via phone with client. CSW verified client  identity. CSW received verbal permission from client for CSW to speak with client about client needs. CSW spoke with Blanch Media about client care plan. CSW encouraged client  to continue to speak with CSW in the next 30 days about financial resources for client in the community. Client said she continues to have some financial challegnes.She said she gets occasional food support from a church Building surveyor. CSW reminded client of [facility] food pantry and about [facility] [place]. CSW again spoke with client  about Neuro[facility]geon in [place], [place], which helps with financial counseling and budget management for clients. Client has spoken with representative at [facility] related to repayment plan for hospital bill due from client. Client did receive some financial help from [employer] to help with making copayments for medical visits for client. CSW encouraged client to call CSW as needed to discuss social work needs of client.  Plan:  Client to continue to speak with CSW in the next 30 days about financial  resources for client in the community.   CSW to call client in 4 weeks to assess needs of client at that time.  Norva Riffle.Debroah Shuttleworth MSW, LCSW Licensed Clinical Social Worker [facility] Care Management [phone]

## 2018-06-02 DIAGNOSIS — J449 Chronic obstructive pulmonary disease, unspecified: Secondary | ICD-10-CM | POA: Diagnosis not present

## 2018-06-02 DIAGNOSIS — M797 Fibromyalgia: Secondary | ICD-10-CM | POA: Diagnosis not present

## 2018-06-02 DIAGNOSIS — Z853 Personal history of malignant neoplasm of breast: Secondary | ICD-10-CM | POA: Diagnosis not present

## 2018-06-02 DIAGNOSIS — I890 Lymphedema, not elsewhere classified: Secondary | ICD-10-CM | POA: Diagnosis not present

## 2018-06-02 DIAGNOSIS — E119 Type 2 diabetes mellitus without complications: Secondary | ICD-10-CM | POA: Diagnosis not present

## 2018-06-03 ENCOUNTER — Other Ambulatory Visit: Payer: Self-pay | Admitting: *Deleted

## 2018-06-03 DIAGNOSIS — E7849 Other hyperlipidemia: Secondary | ICD-10-CM | POA: Diagnosis not present

## 2018-06-03 DIAGNOSIS — K219 Gastro-esophageal reflux disease without esophagitis: Secondary | ICD-10-CM | POA: Diagnosis not present

## 2018-06-03 DIAGNOSIS — M545 Low back pain: Secondary | ICD-10-CM | POA: Diagnosis not present

## 2018-06-03 DIAGNOSIS — M2550 Pain in unspecified joint: Secondary | ICD-10-CM | POA: Diagnosis not present

## 2018-06-03 NOTE — Patient Outreach (Signed)
[facility]) Care Management  06/03/2018  Kelli Wilson 02-27-1951 [id number]    Telephone outreach   Patient with recent inpatient admission to [facility] 5/7-5/9 with SVT, hypertension . PMHx includes but not limited to , Left breast cancer, chemotherapy,Lymphedema , anxiety  Successful telephone outreach to patient, HIPAA verified.  Patient reports that she is doing pretty good.  She discussed her decision to change to another PCP in the area Dr.Hall, initial appointment on 7/24.  She discussed being upset regarding decision of PCP to  discontinue her xanax, and start paxil which she declined to try, MD did refill her pain medication .   Patient discussed further:  Arthritis: Patient reports recent follow up with her bone doctor regarding arthritis pain, right hip and thumb, states she is wearing a brace to left thumb area at times. Patient states she has arthritis all over her body and has follow up visit with MD in next week.   Diabetes Patient reports continuing to monitor blood sugar daily , reviewed recent reading in the 140's. Patient denies having low blood sugar symptoms , she is able to states symptoms and treatment, she recalls taking care of her child that was type 1 diabetic. Patient reports recent Alc 6.2 at PCP visit.   Hypertension /Recent SVT  Patient report continues to monitor blood pressure with usual reading 130-140/80. Patient denies having symptoms of fast heart beat or reading of increased heart rate. Patient reports taking medications as prescribed and denies cost concerns reports she receives extra help.   Left breast CA, Lymphedema Patient attending regular lymphedema therapy sessions she reports she has met her out of pocket limit on May 21 so her pump should be covered.  Patient  continues to attend Duanne [place]imerick support groups, she reports received gift card of $300 due to Neulasta treatment she has received and intends to get another  $150 gift card. Patient discussed using these gift cards to make purchases at grocery store and other needed items. Patient continues to visit various food banks and reports she has plenty food supply.   Reviewed community care goals, patient denies any new concerns at this time and agrees goals have been met. Discussed case closure, ensured patient has my contact number if new concerns arise. She declines further follow up nursing call, she is aware of LCSW planned call in the next month.    Plan Will close nursing program, will send discipline closure letter to MD.  Will notify LCSW of nursing program closure.    Joylene Draft, RN, [place] Management Coordinator  [phone]- Mobile [phone]- Toll Free Main Office

## 2018-06-05 ENCOUNTER — Other Ambulatory Visit: Payer: Self-pay

## 2018-06-05 ENCOUNTER — Encounter (HOSPITAL_COMMUNITY): Payer: Self-pay

## 2018-06-05 ENCOUNTER — Inpatient Hospital Stay (HOSPITAL_COMMUNITY): Payer: Medicare Other | Attending: Hematology and Oncology

## 2018-06-05 DIAGNOSIS — Z452 Encounter for adjustment and management of vascular access device: Secondary | ICD-10-CM | POA: Insufficient documentation

## 2018-06-05 DIAGNOSIS — Z853 Personal history of malignant neoplasm of breast: Secondary | ICD-10-CM | POA: Insufficient documentation

## 2018-06-05 MED ORDER — SODIUM CHLORIDE 0.9% FLUSH
10.0000 mL | INTRAVENOUS | Status: DC | PRN
  Administered 2018-06-05: 10 mL via INTRAVENOUS
  Filled 2018-06-05: qty 10

## 2018-06-05 MED ORDER — HEPARIN SOD (PORK) LOCK FLUSH 100 UNIT/ML IV SOLN
500.0000 [IU] | Freq: Once | INTRAVENOUS | Status: AC
  Administered 2018-06-05: 500 [IU] via INTRAVENOUS

## 2018-06-05 NOTE — Progress Notes (Signed)
Kelli Wilson presented for Portacath access and flush.  Portacath located right chest wall accessed with  H 20 needle.  Good blood return present. Portacath flushed with 53ml NS and 500U/18ml Heparin and needle removed intact.  Procedure tolerated well and without incident.  Discharged ambulatory.

## 2018-06-10 DIAGNOSIS — Z853 Personal history of malignant neoplasm of breast: Secondary | ICD-10-CM | POA: Diagnosis not present

## 2018-06-10 DIAGNOSIS — J449 Chronic obstructive pulmonary disease, unspecified: Secondary | ICD-10-CM | POA: Diagnosis not present

## 2018-06-10 DIAGNOSIS — M797 Fibromyalgia: Secondary | ICD-10-CM | POA: Diagnosis not present

## 2018-06-10 DIAGNOSIS — I890 Lymphedema, not elsewhere classified: Secondary | ICD-10-CM | POA: Diagnosis not present

## 2018-06-10 DIAGNOSIS — E119 Type 2 diabetes mellitus without complications: Secondary | ICD-10-CM | POA: Diagnosis not present

## 2018-06-17 ENCOUNTER — Ambulatory Visit (INDEPENDENT_AMBULATORY_CARE_PROVIDER_SITE_OTHER): Payer: Medicare Other | Admitting: Orthopaedic Surgery

## 2018-06-17 ENCOUNTER — Encounter: Payer: Self-pay | Admitting: Orthopaedic Surgery

## 2018-06-17 VITALS — BP 143/86 | HR 85 | Temp 98.6°F | Ht 67.5 in | Wt 263.0 lb

## 2018-06-17 DIAGNOSIS — M545 Low back pain, unspecified: Secondary | ICD-10-CM

## 2018-06-17 DIAGNOSIS — Z6841 Body Mass Index (BMI) 40.0 and over, adult: Secondary | ICD-10-CM | POA: Diagnosis not present

## 2018-06-17 DIAGNOSIS — G8929 Other chronic pain: Secondary | ICD-10-CM | POA: Diagnosis not present

## 2018-06-17 NOTE — Progress Notes (Signed)
Patient [name], female DOB:01-Nov-1951, 67 y.o. NLG:[id number]  Chief Complaint  Patient presents with  . Hand Pain    Recheck on right hand pain and back pain.    HPI  Kelli Wilson is a 67 y.o. female who has chronic lower back pain and right thumb pain.  She has no new trauma.  She has no new trauma. She has completed her chemotherapy. She has no paresthesias. She is doing her exercises.  She has no weakness.   Body mass index is 40.58 kg/m.  ROS  Review of Systems  Constitutional:       She has diabetes diet controlled. She has COPD  HENT: Positive for congestion.   Respiratory: Positive for shortness of breath. Negative for cough.   Cardiovascular: Negative for chest pain and leg swelling.  Endocrine: Positive for cold intolerance.  Musculoskeletal: Positive for arthralgias, back pain and myalgias.  Allergic/Immunologic: Positive for environmental allergies and food allergies.  Psychiatric/Behavioral: The patient is nervous/anxious.   All other systems reviewed and are negative.   All other systems reviewed and are negative.  Past Medical History:  Diagnosis Date  . Anxiety   . Arthritis   . Back pain   . Byssinosis ([place])    secondary to working in a [employer]  . Chest pain   . Chronic pain   . Depression   . Diabetes mellitus without complication ([place])   . Fibromyalgia   . GERD (gastroesophageal reflux disease)   . Hypercholesteremia   . Hypertension   . Hypokalemia   . Hypomagnesemia   . Knee pain   . Obesity   . Peptic ulcer    history of - normal GI studies Sept 2010  . PSVT (paroxysmal supraventricular tachycardia) ([place])    a. dx 03/2018 - long RP likely atrial tachycardia.    Past Surgical History:  Procedure Laterality Date  . APPENDECTOMY    . bladder rectal repair    . BREAST SURGERY Left    breast biopsy- benign  . CHOLECYSTECTOMY    . DILATION AND CURETTAGE OF UTERUS    . GANGLION CYST EXCISION Left   . MASTECTOMY  MODIFIED RADICAL Left 09/15/2017   Procedure: LEFT MODIFIED RADICAL MASTECTOMY;  Surgeon: Aviva Signs, MD;  Location: [facility];  Service: General;  Laterality: Left;  . PORTACATH PLACEMENT Right 10/13/2017   Procedure: INSERTION PORT-A-CATH RIGHT SUBCLAVIAN;  Surgeon: Aviva Signs, MD;  Location: [facility];  Service: General;  Laterality: Right;  . TOTAL ABDOMINAL HYSTERECTOMY      Family History  Problem Relation Age of Onset  . Aneurysm Father   . Heart disease Father   . COPD Mother   . Diabetes Mother     Social History Social History   Tobacco Use  . Smoking status: Never Smoker  . Smokeless tobacco: Current User    Types: Snuff  Substance Use Topics  . Alcohol use: No  . Drug use: No    Allergies  Allergen Reactions  . Magnesium Sulfate Shortness Of Breath    Experienced shortness of breath and chest pain during mag sulfate infusion. -11/21/17 -gwd  . Other Anaphylaxis and Other (See Comments)    Lysol and other cleaning products  . Cortisone     Patient passed out   . Iohexol Other (See Comments)     Code: HIVES, Desc: PT STATES SHE WAS GIVEN IV DYE AT [facility] AND BROKE OUT IN HIVES, RASH, AND HAD TO BE HOSPITALIZED DUE TO  REACTION   . Ivp Dye [Iodinated Diagnostic Agents] Other (See Comments)    Unknown  . Januvia [Sitagliptin]     Stomach pains   . Latex Itching  . Motrin [Ibuprofen] Other (See Comments)    Unknown  . Nsaids Other (See Comments)    Unknown  . Statins Other (See Comments)    Unknown  . Sulfa Antibiotics Other (See Comments)    Unknown  . Adhesive [Tape] Rash    Current Outpatient Medications  Medication Sig Dispense Refill  . albuterol (PROVENTIL) (2.5 MG/3ML) 0.083% nebulizer solution Take 2.5 mg by nebulization every 6 (six) hours as needed for wheezing or shortness of breath.     . ALPRAZolam (XANAX) 0.5 MG tablet Take 0.5 mg by mouth 4 (four) times daily as needed for anxiety.     . Cholecalciferol (VITAMIN D3) 5000 units CAPS Take  5,000 Units by mouth daily.    . diphenoxylate-atropine (LOMOTIL) 2.5-0.025 MG tablet Take 1 tablet by mouth 4 (four) times daily as needed for diarrhea or loose stools. 30 tablet 0  . HYDROcodone-acetaminophen (NORCO/VICODIN) 5-325 MG tablet Take 1-2 tablets by mouth every 6 (six) hours as needed for moderate pain.   0  . hydrOXYzine (ATARAX/VISTARIL) 50 MG tablet Take 50 mg by mouth every other day. Every other day at bedtime and as needed    . magnesium oxide (MAG-OX) 400 (241.3 Mg) MG tablet Take 1 tablet (400 mg total) by mouth 3 (three) times daily between meals. (Patient taking differently: Take 400 mg by mouth daily. ) 90 tablet 6  . metFORMIN (GLUCOPHAGE) 500 MG tablet Take 500 mg by mouth 2 (two) times daily with a meal.     . metoprolol succinate (TOPROL-XL) 100 MG 24 hr tablet Take 1 tablet (100 mg total) by mouth daily. Take with or immediately following a meal. 30 tablet 6  . Misc. Devices MISC Please provide the patient with one pneumatic compression pump for lymphedema treatment 1 each 0  . nitroGLYCERIN (NITROSTAT) 0.4 MG SL tablet Place 0.4 mg under the tongue every 5 (five) minutes as needed for chest pain.    Marland Kitchen omeprazole (PRILOSEC) 40 MG capsule Take 40 mg every other day by mouth. At night    . potassium chloride SA (KLOR-CON M20) 20 MEQ tablet Take 2 tablets (40 mEq total) by mouth daily. 180 tablet 3  . pravastatin (PRAVACHOL) 40 MG tablet Take 40 mg by mouth daily.    . promethazine (PHENERGAN) 25 MG tablet Take 1 tablet (25 mg total) by mouth every 6 (six) hours as needed for nausea or vomiting. 30 tablet 3   No current facility-administered medications for this visit.      Physical Exam  Blood pressure (!) 143/86, pulse 85, temperature 98.6 F (37 C), height 5' 7.5" (1.715 m), weight 263 lb (119.3 kg).  Constitutional: overall normal hygiene, normal nutrition, well developed, normal grooming, normal body habitus. Assistive device:none  Musculoskeletal: gait and  station Limp none, muscle tone and strength are normal, no tremors or atrophy is present.  .  Neurological: coordination overall normal.  Deep tendon reflex/nerve stretch intact.  Sensation normal.  Cranial nerves II-XII intact.   Skin:   Normal overall no scars, lesions, ulcers or rashes. No psoriasis.  Psychiatric: Alert and oriented x 3.  Recent memory intact, remote memory unclear.  Normal mood and affect. Well groomed.  Good eye contact.  Cardiovascular: overall no swelling, no varicosities, no edema bilaterally, normal temperatures of the legs  and arms, no clubbing, cyanosis and good capillary refill.  Lymphatic: palpation is normal.  Spine/Pelvis examination:  Inspection:  Overall, sacoiliac joint benign and hips nontender; without crepitus or defects.   Thoracic spine inspection: Alignment normal without kyphosis present   Lumbar spine inspection:  Alignment  with normal lumbar lordosis, without scoliosis apparent.   Thoracic spine palpation:  without tenderness of spinal processes   Lumbar spine palpation: without tenderness of lumbar area; without tightness of lumbar muscles    Range of Motion:   Lumbar flexion, forward flexion is normal without pain or tenderness    Lumbar extension is full without pain or tenderness   Left lateral bend is normal without pain or tenderness   Right lateral bend is normal without pain or tenderness   Straight leg raising is normal  Strength & tone: normal   Stability overall normal stability All other systems reviewed and are negative   The patient has been educated about the nature of the problem(s) and counseled on treatment options.  The patient appeared to understand what I have discussed and is in agreement with it.  The patient meets the AMA guidelines for Morbid (severe) obesity with a BMI > 40.0 and I have recommended weight loss.  Encounter Diagnoses  Name Primary?  . Chronic midline low back pain without sciatica   . Body  mass index 40.0-44.9, adult ([place]) Yes  . Morbid obesity ([place])     PLAN Call if any problems.  Precautions discussed.  Continue current medications.   Return to clinic 3 months   Electronically Signed Sanjuana Kava, MD 7/24/20191:50 PM

## 2018-06-18 DIAGNOSIS — I890 Lymphedema, not elsewhere classified: Secondary | ICD-10-CM | POA: Diagnosis not present

## 2018-06-18 DIAGNOSIS — M797 Fibromyalgia: Secondary | ICD-10-CM | POA: Diagnosis not present

## 2018-06-18 DIAGNOSIS — E119 Type 2 diabetes mellitus without complications: Secondary | ICD-10-CM | POA: Diagnosis not present

## 2018-06-18 DIAGNOSIS — Z853 Personal history of malignant neoplasm of breast: Secondary | ICD-10-CM | POA: Diagnosis not present

## 2018-06-18 DIAGNOSIS — J449 Chronic obstructive pulmonary disease, unspecified: Secondary | ICD-10-CM | POA: Diagnosis not present

## 2018-06-22 DIAGNOSIS — E1165 Type 2 diabetes mellitus with hyperglycemia: Secondary | ICD-10-CM | POA: Diagnosis not present

## 2018-06-22 DIAGNOSIS — E782 Mixed hyperlipidemia: Secondary | ICD-10-CM | POA: Diagnosis not present

## 2018-06-22 DIAGNOSIS — K219 Gastro-esophageal reflux disease without esophagitis: Secondary | ICD-10-CM | POA: Diagnosis not present

## 2018-06-22 DIAGNOSIS — I10 Essential (primary) hypertension: Secondary | ICD-10-CM | POA: Diagnosis not present

## 2018-06-23 DIAGNOSIS — I972 Postmastectomy lymphedema syndrome: Secondary | ICD-10-CM | POA: Diagnosis not present

## 2018-06-25 ENCOUNTER — Other Ambulatory Visit: Payer: Self-pay | Admitting: Licensed Clinical Social Worker

## 2018-06-25 NOTE — Patient Outreach (Signed)
Assessment:  CSW spoke via phone with client. CSW verified client identity. CSW received verbal permission from client for CSW to speak with client about client needs. CSW spoke with client about client care plan. CSW encouraged that Kelli Wilson continue to communicate with CSW in the next 30 days to discuss financial resources for client in the community.  Client has received some financial help from the [employer] to help client in making copayments for medical visits for client. CSW has talked with client about [facility] food pantry and about [facility] [place]. Kelli Wilson said she continues to go periodically to [employer] also for help. Client has been in contact with financial counselor at [facility] and client is working out a monthly payment arrangement for client to [facility] for past hospital bill.  Client has received some gift cards and these are helpful to her to obtain some needed food items or items for the home. CSW has also talked with client about Neurosurgeon in [place], [place] which helps with budget management and debt repayment plans.  Kelli Wilson is attending scheduled medical appointments. She changed her primary care doctor recently and now sees Dr. Nevada Crane as primary care doctor. She had appointment with Dr. [place] Crane in July of 2019.  She attends scheduled appointments at lymphedema center.  [facility] RN Landis Martins did recently discharge client from [place] since [place] goals with client were met. Client said she attends Kelli Wilson support groups two time weekly and these support group are helpful. CSW thanked client for phone call with CSW on 06/25/18. CSW also reminded client of [facility] program support in nursing, social work and pharmacy.    Plan:  Client to continue to communicate with CSW in the next 30 days to discuss financial resources for client in the community.   CSW to call client in 4 weeks  to assess needs of client at that time.  Norva Riffle.Laval Cafaro MSW, LCSW Licensed Clinical Social Worker [facility] Care Management [phone]

## 2018-07-23 ENCOUNTER — Other Ambulatory Visit: Payer: Self-pay | Admitting: Licensed Clinical Social Worker

## 2018-07-23 NOTE — Patient Outreach (Signed)
Assessment:  CSW spoke via phone with client. CSW verified client identity. CSW received verbal permission from client for CSW to speak with client about client needs. CSW spoke with client about client care plan. CSW encouraged that Amos continue to talk with CSW in the next 30 days to discuss financial resources for client in the community. CSW reminded client of food support help through [facility]. CSW reminded client about [employer] analyst.  Ricardo said she goes periodically to [employer] for some food assistance.  CSW talked with client about payment schedule for client to pay past medical bill owed to [facility]. Client said she is making monthly payments to [facility] to address past bill owed. CSW has talked with client also about Midwife in [place], [place].  Client said she attends [facility] support groups weekly, as able, and that these groups are very helpful to client.  CSW has informed client of [facility] program support in nursing, social work and pharmacy. Client sees Dr. Nevada Crane as her primary care doctor. She had an appointment with Dr. Nevada Crane in July of 2019.  CSW encouraged client to call CSW as needed to discuss social work needs of client.     Plan:  Client to continue to talk with CSW in the next 30 days to discuss financial resources for client in the community.   CSW to call client in 4 weeks to assess needs of client at that time.  Norva Riffle.Hadlee Burback MSW, LCSW Licensed Clinical Social Worker [facility] Care Management [phone]

## 2018-07-29 ENCOUNTER — Other Ambulatory Visit (HOSPITAL_COMMUNITY): Payer: Self-pay

## 2018-07-29 DIAGNOSIS — Z1231 Encounter for screening mammogram for malignant neoplasm of breast: Secondary | ICD-10-CM | POA: Diagnosis not present

## 2018-07-29 DIAGNOSIS — C50919 Malignant neoplasm of unspecified site of unspecified female breast: Secondary | ICD-10-CM

## 2018-07-30 ENCOUNTER — Encounter (HOSPITAL_COMMUNITY): Payer: Self-pay

## 2018-07-30 ENCOUNTER — Other Ambulatory Visit: Payer: Self-pay

## 2018-07-30 ENCOUNTER — Inpatient Hospital Stay (HOSPITAL_COMMUNITY): Payer: Medicare Other | Attending: Hematology and Oncology

## 2018-07-30 VITALS — BP 140/86 | HR 88 | Temp 97.7°F | Resp 18

## 2018-07-30 DIAGNOSIS — C50912 Malignant neoplasm of unspecified site of left female breast: Secondary | ICD-10-CM

## 2018-07-30 DIAGNOSIS — Z853 Personal history of malignant neoplasm of breast: Secondary | ICD-10-CM | POA: Insufficient documentation

## 2018-07-30 DIAGNOSIS — Z95828 Presence of other vascular implants and grafts: Secondary | ICD-10-CM

## 2018-07-30 DIAGNOSIS — C50919 Malignant neoplasm of unspecified site of unspecified female breast: Secondary | ICD-10-CM

## 2018-07-30 LAB — CBC WITH DIFFERENTIAL/PLATELET
Basophils Absolute: 0.1 10*3/uL (ref 0.0–0.1)
Basophils Relative: 1 %
Eosinophils Absolute: 0.3 10*3/uL (ref 0.0–0.7)
Eosinophils Relative: 3 %
HEMATOCRIT: 38.9 % (ref 36.0–46.0)
HEMOGLOBIN: 12.9 g/dL (ref 12.0–15.0)
LYMPHS ABS: 4.3 10*3/uL — AB (ref 0.7–4.0)
Lymphocytes Relative: 51 %
MCH: 28.7 pg (ref 26.0–34.0)
MCHC: 33.2 g/dL (ref 30.0–36.0)
MCV: 86.6 fL (ref 78.0–100.0)
MONO ABS: 0.6 10*3/uL (ref 0.1–1.0)
MONOS PCT: 7 %
NEUTROS ABS: 3.2 10*3/uL (ref 1.7–7.7)
NEUTROS PCT: 38 %
Platelets: 235 10*3/uL (ref 150–400)
RBC: 4.49 MIL/uL (ref 3.87–5.11)
RDW: 14.4 % (ref 11.5–15.5)
WBC: 8.3 10*3/uL (ref 4.0–10.5)

## 2018-07-30 LAB — COMPREHENSIVE METABOLIC PANEL
ALBUMIN: 3.8 g/dL (ref 3.5–5.0)
ALK PHOS: 83 U/L (ref 38–126)
ALT: 21 U/L (ref 0–44)
ANION GAP: 9 (ref 5–15)
AST: 28 U/L (ref 15–41)
BUN: 12 mg/dL (ref 8–23)
CALCIUM: 9.2 mg/dL (ref 8.9–10.3)
CO2: 26 mmol/L (ref 22–32)
Chloride: 104 mmol/L (ref 98–111)
Creatinine, Ser: 0.93 mg/dL (ref 0.44–1.00)
GFR calc non Af Amer: >60 mL/min (ref >60)
GLUCOSE: 140 mg/dL — AB (ref 70–99)
POTASSIUM: 4 mmol/L (ref 3.5–5.1)
Sodium: 139 mmol/L (ref 135–145)
Total Bilirubin: 0.7 mg/dL (ref 0.3–1.2)
Total Protein: 7.3 g/dL (ref 6.5–8.1)

## 2018-07-30 MED ORDER — HEPARIN SOD (PORK) LOCK FLUSH 100 UNIT/ML IV SOLN
500.0000 [IU] | Freq: Once | INTRAVENOUS | Status: AC
  Administered 2018-07-30: 500 [IU] via INTRAVENOUS

## 2018-07-30 MED ORDER — SODIUM CHLORIDE 0.9% FLUSH
10.0000 mL | INTRAVENOUS | Status: DC | PRN
  Administered 2018-07-30: 10 mL via INTRAVENOUS
  Filled 2018-07-30: qty 10

## 2018-07-30 NOTE — Progress Notes (Signed)
Kelli Wilson presented for Portacath access and flush.  Portacath located right chest wall accessed with  H 20 needle.  Good blood return present. Portacath flushed with 41ml NS and 500U/62ml Heparin and needle removed intact.  Procedure tolerated well and without incident.  Discharged ambulatory.

## 2018-07-31 LAB — CANCER ANTIGEN 15-3: CA 15-3: 8.4 U/mL (ref 0.0–25.0)

## 2018-08-06 ENCOUNTER — Encounter (HOSPITAL_COMMUNITY): Payer: Self-pay | Admitting: Internal Medicine

## 2018-08-06 ENCOUNTER — Encounter (HOSPITAL_COMMUNITY): Payer: Self-pay | Admitting: Lab

## 2018-08-06 ENCOUNTER — Inpatient Hospital Stay (HOSPITAL_BASED_OUTPATIENT_CLINIC_OR_DEPARTMENT_OTHER): Payer: Medicare Other | Admitting: Internal Medicine

## 2018-08-06 VITALS — BP 143/77 | HR 81 | Temp 98.1°F | Resp 16 | Wt 266.9 lb

## 2018-08-06 DIAGNOSIS — Z853 Personal history of malignant neoplasm of breast: Secondary | ICD-10-CM | POA: Diagnosis not present

## 2018-08-06 DIAGNOSIS — C50912 Malignant neoplasm of unspecified site of left female breast: Secondary | ICD-10-CM

## 2018-08-06 NOTE — Progress Notes (Unsigned)
Referral sent to [facility]. Pt transferring care to them.  Referral sent on 9/12

## 2018-08-06 NOTE — Progress Notes (Signed)
Diagnosis Malignant neoplasm of left female breast, unspecified estrogen receptor status, unspecified site of breast ([place]) - Plan: CBC with Differential/Platelet, Comprehensive metabolic panel, Lactate dehydrogenase  Staging Cancer Staging Malignant neoplasm of upper-outer quadrant of left female breast (HCC) Staging form: Breast, AJCC 8th Edition - Clinical: cT1c, cN0 - Unsigned - Pathologic: Stage IB (pT1c, pN0, cM0, G3, ER: Negative, PR: Negative, HER2: Negative) - Signed by Twana First, MD on 10/09/2017   Assessment and Plan:  Malignant neoplasm of upper-outer quadrant of left female breast (HCC) 1. Stage Ib (PT1CPN0) triple negative left breast cancer.  Pt followed by Dr. Redge Gainer.   - Status post left modified radical mastectomy and lymph node dissection on 09/15/2017, pathology showing triple negative IDC, 1.8 cm, margins negative, 8 out of 8 negative lymph nodes, grade 3 -Adjuvant chemotherapy with 4 cycles of AC from 10/20/2017 through 12/23/2017, weekly paclitaxel 12 cycles completed on 03/24/2018, dose reduced after week 5 secondary to neuropathy.  Labs done 07/30/2018 reviewed and showed WBC 8.3 HB 12.9 plts 235,000.  Chemistries WNL with K+ 4, Cr 0.93 and normal LFTS.  Right screening mammogram done 07/29/2018 negative.  She is Recommended for repeat right screening mammogram in 07/2019.   Pt reports she will transfer to [facility] due to financial reasons.  Clinic will be happy to forward records if needed.    She is tentatively given a 6 month follow-up with labs if she desires to maintain follow-up.  She was questioning why she never had scans done.  I discussed with her she has node negative early stage breast cancer, so scans were not indicated.    2.  Financial concerns.  Have asked for financial counselor to speak with pt.    3.  IBS.  PT on Lomotil 2-3 tabs per day.    4.  COPD.  Pt on inhalers.  Follow-up with PCP or pulmonary as directed.  Pulse ox 96% on room  air.    30 minutes spent with more than 50% spent in counseling and coordination of care.    Current Status:  Pt seen today for follow-up to go over labs and mammograms.  She reports she will have to transfer to [facility] due to financial concerns.     Problem List Patient Active Problem List   Diagnosis Date Noted  . SVT (supraventricular tachycardia) ([place]) [I47.1] 03/30/2018  . Breast cancer, left breast ([place]) [C50.912] 09/15/2017  . Malignant neoplasm of upper-outer quadrant of left female breast ([place]) [C50.412]   . Hypercholesteremia [E78.00]   . Hypertension [I10]   . Anxiety [F41.9]   . Depression [F32.9]   . Fibromyalgia [M79.7]   . GERD (gastroesophageal reflux disease) [K21.9]   . Byssinosis ([place]) [J66.0]     Past Medical History Past Medical History:  Diagnosis Date  . Anxiety   . Arthritis   . Back pain   . Byssinosis ([place])    secondary to working in a [employer]  . Chest pain   . Chronic pain   . Depression   . Diabetes mellitus without complication ([place])   . Fibromyalgia   . GERD (gastroesophageal reflux disease)   . Hypercholesteremia   . Hypertension   . Hypokalemia   . Hypomagnesemia   . Knee pain   . Obesity   . Peptic ulcer    history of - normal GI studies Sept 2010  . PSVT (paroxysmal supraventricular tachycardia) ([place])    a. dx 03/2018 - long RP likely atrial tachycardia.  Past Surgical History Past Surgical History:  Procedure Laterality Date  . APPENDECTOMY    . bladder rectal repair    . BREAST SURGERY Left    breast biopsy- benign  . CHOLECYSTECTOMY    . DILATION AND CURETTAGE OF UTERUS    . GANGLION CYST EXCISION Left   . MASTECTOMY MODIFIED RADICAL Left 09/15/2017   Procedure: LEFT MODIFIED RADICAL MASTECTOMY;  Surgeon: Aviva Signs, MD;  Location: [facility];  Service: General;  Laterality: Left;  . PORTACATH PLACEMENT Right 10/13/2017   Procedure: INSERTION PORT-A-CATH RIGHT SUBCLAVIAN;  Surgeon: Aviva Signs, MD;  Location: [facility];   Service: General;  Laterality: Right;  . TOTAL ABDOMINAL HYSTERECTOMY      Family History Family History  Problem Relation Age of Onset  . Aneurysm Father   . Heart disease Father   . COPD Mother   . Diabetes Mother      Social History  reports that she has never smoked. Her smokeless tobacco use includes snuff. She reports that she does not drink alcohol or use drugs.  Medications  Current Outpatient Medications:  .  albuterol (PROVENTIL) (2.5 MG/3ML) 0.083% nebulizer solution, Take 2.5 mg by nebulization every 6 (six) hours as needed for wheezing or shortness of breath. , Disp: , Rfl:  .  ALPRAZolam (XANAX) 0.5 MG tablet, Take 0.5 mg by mouth 4 (four) times daily as needed for anxiety. , Disp: , Rfl:  .  Cholecalciferol (VITAMIN D3) 5000 units CAPS, Take 5,000 Units by mouth daily., Disp: , Rfl:  .  diphenoxylate-atropine (LOMOTIL) 2.5-0.025 MG tablet, Take 1 tablet by mouth 4 (four) times daily as needed for diarrhea or loose stools., Disp: 30 tablet, Rfl: 0 .  HYDROcodone-acetaminophen (NORCO/VICODIN) 5-325 MG tablet, Take 1-2 tablets by mouth every 6 (six) hours as needed for moderate pain. , Disp: , Rfl: 0 .  hydrOXYzine (ATARAX/VISTARIL) 50 MG tablet, Take 50 mg by mouth every other day. Every other day at bedtime and as needed, Disp: , Rfl:  .  magnesium oxide (MAG-OX) 400 (241.3 Mg) MG tablet, Take 1 tablet (400 mg total) by mouth 3 (three) times daily between meals. (Patient taking differently: Take 400 mg by mouth daily. ), Disp: 90 tablet, Rfl: 6 .  metFORMIN (GLUCOPHAGE) 500 MG tablet, Take 500 mg by mouth 2 (two) times daily with a meal. , Disp: , Rfl:  .  metoprolol succinate (TOPROL-XL) 100 MG 24 hr tablet, Take 1 tablet (100 mg total) by mouth daily. Take with or immediately following a meal., Disp: 30 tablet, Rfl: 6 .  Misc. Devices MISC, Please provide the patient with one pneumatic compression pump for lymphedema treatment, Disp: 1 each, Rfl: 0 .  nitroGLYCERIN  (NITROSTAT) 0.4 MG SL tablet, Place 0.4 mg under the tongue every 5 (five) minutes as needed for chest pain., Disp: , Rfl:  .  omeprazole (PRILOSEC) 40 MG capsule, Take 40 mg every other day by mouth. At night, Disp: , Rfl:  .  pravastatin (PRAVACHOL) 40 MG tablet, Take 40 mg by mouth daily., Disp: , Rfl:  .  promethazine (PHENERGAN) 25 MG tablet, Take 1 tablet (25 mg total) by mouth every 6 (six) hours as needed for nausea or vomiting., Disp: 30 tablet, Rfl: 3 .  potassium chloride SA (KLOR-CON M20) 20 MEQ tablet, Take 2 tablets (40 mEq total) by mouth daily., Disp: 180 tablet, Rfl: 3  Allergies Magnesium sulfate; Other; Cortisone; Iohexol; Ivp dye [iodinated diagnostic agents]; Januvia [sitagliptin]; Latex; Motrin [ibuprofen]; Nsaids; Statins;  Sulfa antibiotics; and Adhesive [tape]  Review of Systems Review of Systems - Oncology ROS negative other than COPD   Physical Exam  Vitals  Pulse ox 96% on room air  Wt Readings from Last 3 Encounters:  08/06/18 266 lb 14.4 oz (121.1 kg)  06/17/18 263 lb (119.3 kg)  06/05/18 266 lb 1.6 oz (120.7 kg)   Temp Readings from Last 3 Encounters:  08/06/18 98.1 F (36.7 C) (Oral)  07/30/18 97.7 F (36.5 C) (Oral)  06/17/18 98.6 F (37 C)   BP Readings from Last 3 Encounters:  08/06/18 (!) 143/77  07/30/18 140/86  06/17/18 (!) 143/86   Pulse Readings from Last 3 Encounters:  08/06/18 81  07/30/18 88  06/17/18 85    Constitutional: Well-developed, well-nourished, and in no distress.   HENT: Head: Normocephalic and atraumatic.  Mouth/Throat: No oropharyngeal exudate. Mucosa moist. Eyes: Pupils are equal, round, and reactive to light. Conjunctivae are normal. No scleral icterus.  Neck: Normal range of motion. Neck supple. No JVD present.  Cardiovascular: Normal rate, regular rhythm and normal heart sounds.  Exam reveals no gallop and no friction rub.   No murmur heard. Pulmonary/Chest: Effort normal and breath sounds normal. No  respiratory distress. No wheezes.No rales.  Abdominal: Soft. Bowel sounds are normal. No distension. There is no tenderness. There is no guarding.  Musculoskeletal: No edema or tenderness.  Lymphadenopathy: No cervical, axillary or supraclavicular adenopathy.  Neurological: Alert and oriented to person, place, and time. No cranial nerve deficit.  Skin: Skin is warm and dry. No rash noted. No erythema. No pallor.  Psychiatric: Affect and judgment normal.  Breast exam:  Chaperone present.   Left mastectomy healed.  No palpable nodules noted.  Right breast shows no palpable dominant masses.    Labs No visits with results within 3 Day(s) from this visit.  Latest known visit with results is:  Infusion on 07/30/2018  Component Date Value Ref Range Status  . CA 15-3 07/30/2018 8.4  0.0 - 25.0 U/mL Final   Comment: (NOTE) Roche Diagnostics Electrochemiluminescence Immunoassay (ECLIA) Values obtained with different assay methods or kits cannot be used interchangeably.  Results cannot be interpreted as absolute evidence of the presence or absence of malignant disease. Performed At: [facility] [address], [place] [id number] Rush Farmer MD MB:[id number]   . WBC 07/30/2018 8.3  4.0 - 10.5 K/uL Final  . RBC 07/30/2018 4.49  3.87 - 5.11 MIL/uL Final  . Hemoglobin 07/30/2018 12.9  12.0 - 15.0 g/dL Final  . HCT 07/30/2018 38.9  36.0 - 46.0 % Final  . MCV 07/30/2018 86.6  78.0 - 100.0 fL Final  . MCH 07/30/2018 28.7  26.0 - 34.0 pg Final  . MCHC 07/30/2018 33.2  30.0 - 36.0 g/dL Final  . RDW 07/30/2018 14.4  11.5 - 15.5 % Final  . Platelets 07/30/2018 235  150 - 400 K/uL Final  . Neutrophils Relative % 07/30/2018 38  % Final  . Neutro Abs 07/30/2018 3.2  1.7 - 7.7 K/uL Final  . Lymphocytes Relative 07/30/2018 51  % Final  . Lymphs Abs 07/30/2018 4.3* 0.7 - 4.0 K/uL Final  . Monocytes Relative 07/30/2018 7  % Final  . Monocytes Absolute 07/30/2018 0.6  0.1 - 1.0 K/uL Final   . Eosinophils Relative 07/30/2018 3  % Final  . Eosinophils Absolute 07/30/2018 0.3  0.0 - 0.7 K/uL Final  . Basophils Relative 07/30/2018 1  % Final  . Basophils Absolute 07/30/2018 0.1  0.0 -  0.1 K/uL Final   Performed at [facility], [address] [place], [place] [address]  . Sodium 07/30/2018 139  135 - 145 mmol/L Final  . Potassium 07/30/2018 4.0  3.5 - 5.1 mmol/L Final  . Chloride 07/30/2018 104  98 - 111 mmol/L Final  . CO2 07/30/2018 26  22 - 32 mmol/L Final  . Glucose, Bld 07/30/2018 140* 70 - 99 mg/dL Final  . BUN 07/30/2018 12  8 - 23 mg/dL Final  . Creatinine, Ser 07/30/2018 0.93  0.44 - 1.00 mg/dL Final  . Calcium 07/30/2018 9.2  8.9 - 10.3 mg/dL Final  . Total Protein 07/30/2018 7.3  6.5 - 8.1 g/dL Final  . Albumin 07/30/2018 3.8  3.5 - 5.0 g/dL Final  . AST 07/30/2018 28  15 - 41 U/L Final  . ALT 07/30/2018 21  0 - 44 U/L Final  . Alkaline Phosphatase 07/30/2018 83  38 - 126 U/L Final  . Total Bilirubin 07/30/2018 0.7  0.3 - 1.2 mg/dL Final  . GFR calc non Af Amer 07/30/2018 >60  >60 mL/min Final  . GFR calc Af Amer 07/30/2018 >60  >60 mL/min Final   Comment: (NOTE) The eGFR has been calculated using the CKD EPI equation. This calculation has not been validated in all clinical situations. eGFR's persistently <60 mL/min signify possible Chronic Kidney Disease.   Georgiann Hahn gap 07/30/2018 9  5 - 15 Final   Performed at [facility], [address], [place], [place] [address]     Pathology Orders Placed This Encounter  Procedures  . CBC with Differential/Platelet    Standing Status:   Future    Standing Expiration Date:   08/06/2020  . Comprehensive metabolic panel    Standing Status:   Future    Standing Expiration Date:   08/06/2020  . Lactate dehydrogenase    Standing Status:   Future    Standing Expiration Date:   08/06/2020       Zoila Shutter MD

## 2018-08-25 DIAGNOSIS — C50112 Malignant neoplasm of central portion of left female breast: Secondary | ICD-10-CM | POA: Diagnosis not present

## 2018-08-25 DIAGNOSIS — Z171 Estrogen receptor negative status [ER-]: Secondary | ICD-10-CM | POA: Diagnosis not present

## 2018-08-27 ENCOUNTER — Other Ambulatory Visit: Payer: Self-pay | Admitting: Licensed Clinical Social Worker

## 2018-08-27 NOTE — Patient Outreach (Signed)
Assessment:  CSW spoke via phone with client. CSW verified client identity. CSW received verbal permission from client  for CSW to speak with client about  client needs. CSW spoke with client about client care plan.  CSW encouraged that client continue to talk with CSW in the next 30 days to discuss financial resources for client in the community. CSW has encouraged client to Economist in [place], [place] related to financial needs of client and debt repayment of client.  CSW has talked with client about setting up payment plan with hospital to repay client bill owed to hospital.  CSW reminded client of [employer] food pantry and of [employer].  Client continues to go to support group at [facility]. She said that support group is helpful to her.  She is trying to attend medical appointments as scheduled. Client reported that her primary care doctor is Dr. Allyn Kenner in [place], [place].  She also said she has an oncologist to provide care for her in [place], [place]. She said she has [employer]. She said she has talked with financial counselor several times at [facility] related to bill owed by client.  Client said she had gone yesterday to two food pantries to obtain needed food items. Client said she has started to receive notices from a collection agency about past hospital bill owed by client.  CSW talked with client about possible resources to help her through [facility]. CSW talked with client about her contacting Gillie Manners at [facility] to see if Deneise Lever had other ideas or resources that she may be aware of to possibly help client. CSW thanked client for phone call with CSW on 08/27/18.  Client was appreciative of phone call from [place] on 08/27/18.  Plan:  Client to continue to talk with CSW in  the next 30 days to discuss financial resources for client in the community.  CSW to call  client in 4 weeks to assess client needs.  Norva Riffle.Coburn Knaus MSW, LCSW Licensed Clinical Social Worker [facility] Care Management [phone]

## 2018-09-10 ENCOUNTER — Encounter: Payer: Self-pay | Admitting: Orthopaedic Surgery

## 2018-09-10 ENCOUNTER — Ambulatory Visit (INDEPENDENT_AMBULATORY_CARE_PROVIDER_SITE_OTHER): Payer: Medicare Other | Admitting: Orthopaedic Surgery

## 2018-09-10 VITALS — BP 164/91 | HR 119 | Ht 67.5 in | Wt 271.0 lb

## 2018-09-10 DIAGNOSIS — M545 Low back pain, unspecified: Secondary | ICD-10-CM

## 2018-09-10 DIAGNOSIS — Z6841 Body Mass Index (BMI) 40.0 and over, adult: Secondary | ICD-10-CM | POA: Diagnosis not present

## 2018-09-10 DIAGNOSIS — G8929 Other chronic pain: Secondary | ICD-10-CM | POA: Diagnosis not present

## 2018-09-10 NOTE — Progress Notes (Signed)
Patient JJ:Kelli Wilson A [place], female DOB:[birth year], 67 y.o. SAY:[id number]  Chief Complaint  Patient presents with  . Back Pain    HPI  LINDORA ALVIAR is a 67 y.o. female who has chronic lower back pain.  She has had more pain with the cooler weather.  She has no new trauma,no weakness.  She had a fall a couple of months ago but did not significant damage.  She is active and taking her medicine and doing her exercises.   Body mass index is 41.82 kg/m.  The patient meets the AMA guidelines for Morbid (severe) obesity with a BMI > 40.0 and I have recommended weight loss.   ROS  Review of Systems  Constitutional:       She has diabetes diet controlled. She has COPD  HENT: Positive for congestion.   Respiratory: Positive for shortness of breath. Negative for cough.   Cardiovascular: Negative for chest pain and leg swelling.  Endocrine: Positive for cold intolerance.  Musculoskeletal: Positive for arthralgias, back pain and myalgias.  Allergic/Immunologic: Positive for environmental allergies and food allergies.  Psychiatric/Behavioral: The patient is nervous/anxious.   All other systems reviewed and are negative.   All other systems reviewed and are negative.  The following is a summary of the past history medically, past history surgically, known current medicines, social history and family history.  This information is gathered electronically by the computer from prior information and documentation.  I review this each visit and have found including this information at this point in the chart is beneficial and informative.    Past Medical History:  Diagnosis Date  . Anxiety   . Arthritis   . Back pain   . Byssinosis ([place])    secondary to working in a [employer]  . Chest pain   . Chronic pain   . Depression   . Diabetes mellitus without complication ([place])   . Fibromyalgia   . GERD (gastroesophageal reflux disease)   . Hypercholesteremia   . Hypertension   .  Hypokalemia   . Hypomagnesemia   . Knee pain   . Obesity   . Peptic ulcer    history of - normal GI studies Sept 2010  . PSVT (paroxysmal supraventricular tachycardia) ([place])    a. dx 03/2018 - long RP likely atrial tachycardia.    Past Surgical History:  Procedure Laterality Date  . APPENDECTOMY    . bladder rectal repair    . BREAST SURGERY Left    breast biopsy- benign  . CHOLECYSTECTOMY    . DILATION AND CURETTAGE OF UTERUS    . GANGLION CYST EXCISION Left   . MASTECTOMY MODIFIED RADICAL Left 09/15/2017   Procedure: LEFT MODIFIED RADICAL MASTECTOMY;  Surgeon: Aviva Signs, MD;  Location: [facility];  Service: General;  Laterality: Left;  . PORTACATH PLACEMENT Right 10/13/2017   Procedure: INSERTION PORT-A-CATH RIGHT SUBCLAVIAN;  Surgeon: Aviva Signs, MD;  Location: [facility];  Service: General;  Laterality: Right;  . TOTAL ABDOMINAL HYSTERECTOMY      Family History  Problem Relation Age of Onset  . Aneurysm Father   . Heart disease Father   . COPD Mother   . Diabetes Mother     Social History Social History   Tobacco Use  . Smoking status: Never Smoker  . Smokeless tobacco: Current User    Types: Snuff  Substance Use Topics  . Alcohol use: No  . Drug use: No    Allergies  Allergen Reactions  . Magnesium Sulfate  Shortness Of Breath    Experienced shortness of breath and chest pain during mag sulfate infusion. -11/21/17 -gwd  . Other Anaphylaxis and Other (See Comments)    Lysol and other cleaning products  . Cortisone     Patient passed out   . Iohexol Other (See Comments)     Code: HIVES, Desc: PT STATES SHE WAS GIVEN IV DYE AT [facility] AND BROKE OUT IN HIVES, RASH, AND HAD TO BE HOSPITALIZED DUE TO REACTION   . Ivp Dye [Iodinated Diagnostic Agents] Other (See Comments)    Unknown  . Januvia [Sitagliptin]     Stomach pains   . Latex Itching  . Motrin [Ibuprofen] Other (See Comments)    Unknown  . Nsaids Other (See Comments)    Unknown  . Statins Other  (See Comments)    Unknown  . Sulfa Antibiotics Other (See Comments)    Unknown  . Adhesive [Tape] Rash    Current Outpatient Medications  Medication Sig Dispense Refill  . albuterol (PROVENTIL) (2.5 MG/3ML) 0.083% nebulizer solution Take 2.5 mg by nebulization every 6 (six) hours as needed for wheezing or shortness of breath.     . ALPRAZolam (XANAX) 0.5 MG tablet Take 0.5 mg by mouth 4 (four) times daily as needed for anxiety.     . Cholecalciferol (VITAMIN D3) 5000 units CAPS Take 5,000 Units by mouth daily.    Marland Kitchen HYDROcodone-acetaminophen (NORCO/VICODIN) 5-325 MG tablet Take 1-2 tablets by mouth every 6 (six) hours as needed for moderate pain.   0  . hydrOXYzine (ATARAX/VISTARIL) 50 MG tablet Take 50 mg by mouth every other day. Every other day at bedtime and as needed    . magnesium oxide (MAG-OX) 400 (241.3 Mg) MG tablet Take 1 tablet (400 mg total) by mouth 3 (three) times daily between meals. (Patient taking differently: Take 400 mg by mouth daily. ) 90 tablet 6  . metFORMIN (GLUCOPHAGE) 500 MG tablet Take 500 mg by mouth 2 (two) times daily with a meal.     . metoprolol succinate (TOPROL-XL) 100 MG 24 hr tablet Take 1 tablet (100 mg total) by mouth daily. Take with or immediately following a meal. 30 tablet 6  . Misc. Devices MISC Please provide the patient with one pneumatic compression pump for lymphedema treatment 1 each 0  . nitroGLYCERIN (NITROSTAT) 0.4 MG SL tablet Place 0.4 mg under the tongue every 5 (five) minutes as needed for chest pain.    Marland Kitchen omeprazole (PRILOSEC) 40 MG capsule Take 40 mg every other day by mouth. At night    . pravastatin (PRAVACHOL) 40 MG tablet Take 40 mg by mouth daily.    . promethazine (PHENERGAN) 25 MG tablet Take 1 tablet (25 mg total) by mouth every 6 (six) hours as needed for nausea or vomiting. 30 tablet 3  . diphenoxylate-atropine (LOMOTIL) 2.5-0.025 MG tablet Take 1 tablet by mouth 4 (four) times daily as needed for diarrhea or loose stools.  (Patient not taking: Reported on 09/10/2018) 30 tablet 0  . potassium chloride SA (KLOR-CON M20) 20 MEQ tablet Take 2 tablets (40 mEq total) by mouth daily. 180 tablet 3   No current facility-administered medications for this visit.      Physical Exam  Blood pressure (!) 164/91, pulse (!) 119, height 5' 7.5" (1.715 m), weight 271 lb (122.9 kg).  Constitutional: overall normal hygiene, normal nutrition, well developed, normal grooming, normal body habitus. Assistive device:none  Musculoskeletal: gait and station Limp none, muscle tone and strength are  normal, no tremors or atrophy is present.  .  Neurological: coordination overall normal.  Deep tendon reflex/nerve stretch intact.  Sensation normal.  Cranial nerves II-XII intact.   Skin:   Normal overall no scars, lesions, ulcers or rashes. No psoriasis.  Psychiatric: Alert and oriented x 3.  Recent memory intact, remote memory unclear.  Normal mood and affect. Well groomed.  Good eye contact.  Cardiovascular: overall no swelling, no varicosities, no edema bilaterally, normal temperatures of the legs and arms, no clubbing, cyanosis and good capillary refill.  Lymphatic: palpation is normal.  Spine/Pelvis examination:  Inspection:  Overall, sacoiliac joint benign and hips nontender; without crepitus or defects.   Thoracic spine inspection: Alignment normal without kyphosis present   Lumbar spine inspection:  Alignment  with normal lumbar lordosis, without scoliosis apparent.   Thoracic spine palpation:  without tenderness of spinal processes   Lumbar spine palpation: without tenderness of lumbar area; without tightness of lumbar muscles    Range of Motion:   Lumbar flexion, forward flexion is normal without pain or tenderness    Lumbar extension is full without pain or tenderness   Left lateral bend is normal without pain or tenderness   Right lateral bend is normal without pain or tenderness   Straight leg raising is  normal  Strength & tone: normal   Stability overall normal stability All other systems reviewed and are negative   The patient has been educated about the nature of the problem(s) and counseled on treatment options.  The patient appeared to understand what I have discussed and is in agreement with it.  Encounter Diagnoses  Name Primary?  . Chronic midline low back pain without sciatica Yes  . Body mass index 40.0-44.9, adult ([place])   . Morbid obesity ([place])     PLAN Call if any problems.  Precautions discussed.  Continue current medications.   Return to clinic 3 months   Electronically Signed Sanjuana Kava, MD 10/17/20191:37 PM

## 2018-09-17 ENCOUNTER — Ambulatory Visit: Payer: Medicare Other | Admitting: Orthopaedic Surgery

## 2018-09-25 ENCOUNTER — Other Ambulatory Visit: Payer: Self-pay | Admitting: Licensed Clinical Social Worker

## 2018-09-25 NOTE — Patient Outreach (Signed)
Assessment:  CSW spoke via phone with client. CSW verified client identity. CSW received verbal permission from client for CSW to speak with client about client needs. CSW spoke with Blanch Media about client care plan. CSW encouraged client to continue to communicate with CSW in the next 30 days to discuss financial resources for client in the community.  CSW has talked with client about 2 local food pantries.  CSW has discussed with client her Public affairs consultant in [place], [place] to discuss current financial situation of client and debt repayment plan for client.  Client has talked with CSW about current debt owed to hospital.  CSW has talked with Ermalinda about her trying to set up payment plan with that hospital to repay current client bill owed to hospital. CSW spke again with Tanita about her going to [facility].  Client said she enjoys support from [facility].  She said that she attends a support group at that Center that is very helpful to her.  She continues to attend medical appointments as scheduled. Client has also spoken in the past with York Spaniel at [facility] to see if Lelon Frohlich had other ideas or resources that may be helpful to client. Client sees Dr. Nevada Crane as primary care doctor.  Client sees Dr. Luna Glasgow, orthopedist, as scheduled. CSW has encouraged Audriana to call CSW at [phone] as needed to discuss social work needs of client. CSW thanked Angellina for speaking with CSW via phone on 09/25/18.   Plan:  Client to continue to communicate with CSW in the next 30 days to discuss financial resources for client in the community.  CSW to call client in 4 weeks to assess client needs.  Norva Riffle.Djuan Talton MSW, LCSW Licensed Clinical Social Worker [facility] Care Management [phone]

## 2018-09-28 DIAGNOSIS — E782 Mixed hyperlipidemia: Secondary | ICD-10-CM | POA: Diagnosis not present

## 2018-09-28 DIAGNOSIS — I10 Essential (primary) hypertension: Secondary | ICD-10-CM | POA: Diagnosis not present

## 2018-09-28 DIAGNOSIS — E119 Type 2 diabetes mellitus without complications: Secondary | ICD-10-CM | POA: Diagnosis not present

## 2018-10-05 DIAGNOSIS — Z0000 Encounter for general adult medical examination without abnormal findings: Secondary | ICD-10-CM | POA: Diagnosis not present

## 2018-10-05 DIAGNOSIS — E1122 Type 2 diabetes mellitus with diabetic chronic kidney disease: Secondary | ICD-10-CM | POA: Diagnosis not present

## 2018-10-05 DIAGNOSIS — I129 Hypertensive chronic kidney disease with stage 1 through stage 4 chronic kidney disease, or unspecified chronic kidney disease: Secondary | ICD-10-CM | POA: Diagnosis not present

## 2018-10-05 DIAGNOSIS — N183 Chronic kidney disease, stage 3 (moderate): Secondary | ICD-10-CM | POA: Diagnosis not present

## 2018-10-05 DIAGNOSIS — E782 Mixed hyperlipidemia: Secondary | ICD-10-CM | POA: Diagnosis not present

## 2018-10-13 ENCOUNTER — Other Ambulatory Visit (HOSPITAL_COMMUNITY): Payer: Self-pay | Admitting: Internal Medicine

## 2018-10-13 DIAGNOSIS — Z780 Asymptomatic menopausal state: Secondary | ICD-10-CM

## 2018-10-26 ENCOUNTER — Other Ambulatory Visit: Payer: Self-pay | Admitting: Licensed Clinical Social Worker

## 2018-10-26 NOTE — Patient Outreach (Signed)
Assessment:  CSW spoke via phone with client. CSW verified client identity. CSW received verbal permission from client for CSW to speak with client about client needs. CSW spoke with clinet about clent care plan. CSW encouraged Ernie to communicate with CSW in next 30 days to discuss financial resources for client in the community. CSW has talked with client about access [facility] food pantry for food support. CSW has talked with client about accessing [facility] for food support. CSW has talked with client about financial needs of client. CSW has encouraged client to Economist in [place], [place] to talk about financial needs of client. CSW has talked with client about debt repayment plan of client to hospital. CSW spoke with client about her participation in the [facility].  Client attends support group at that [facility]. She said she enjoys support group assistance.  Client sees Dr. Nevada Crane as primary  care doctor. Client sees Dr. Luna Glasgow, orthopedist, as scheduled.  Client has appointment with cardiologist of 11/13/18. Client has appointment with oncologist on 12/23/18.  Client said that she  applied at [employer] Department for utility assistance for client. CSW thanked client for phone call with CSW on 10/26/18. CSW encouraged client to call CSW as needed to discuss social work needs of client.     Plan:  Client to communicate with CSW in next 30 days to discuss financial resources for client in the community.  CSW to call client in 3 weeks to assess client needs.  Norva Riffle.Mourad Cwikla MSW, LCSW Licensed Clinical Social Worker [facility] Care Management [phone]

## 2018-10-30 ENCOUNTER — Other Ambulatory Visit (HOSPITAL_COMMUNITY): Payer: Medicare Other

## 2018-10-30 ENCOUNTER — Encounter (HOSPITAL_COMMUNITY): Payer: Self-pay

## 2018-11-12 ENCOUNTER — Other Ambulatory Visit: Payer: Self-pay | Admitting: Licensed Clinical Social Worker

## 2018-11-12 NOTE — Patient Outreach (Signed)
Assessment:  CSW spoke via phone with client. CSW verified client identity. CSW received verbal permission from client for CSW to speak with client about client needs.  CSW has been talking with client in recent months about community resources of help to client. Client is active in attending [facility]. CSW has encouraged her to attend support group meetings and learning activities at [facility].  Client has also, in the past, communicated as needed with CSW Kelli Wilson at [facility] related to Cancer support resources for client. CSW informed Kelli Wilson on 12/19,2019 that [place] would transfer CSW care and support for client to LCSW Kelli Wilson on 11/12/18.  Client agreed to this plan.  Kelli Wilson agreed for Kelli Wilson to call her in 4 weeks to assess client needs at that time.  CSW thanked Kelli Wilson to provide CSW support for client in recent months. Kelli Wilson was appreciative of support of CSW Kelli Wilson.  Plan:  CSW Kelli Wilson is transferring [name] care and support for client to [employer] on 11/12/18.  Client agreed to this plan.  Kelli Opitz, LCSW to call client in 4 weeks to access client needs at that time.  Kelli Wilson MSW, LCSW Licensed Clinical Social Worker [facility] Care Management [phone]

## 2018-11-13 ENCOUNTER — Encounter: Payer: Self-pay | Admitting: Cardiovascular Disease

## 2018-11-13 ENCOUNTER — Ambulatory Visit (INDEPENDENT_AMBULATORY_CARE_PROVIDER_SITE_OTHER): Payer: Medicare Other | Admitting: Cardiovascular Disease

## 2018-11-13 VITALS — BP 132/76 | HR 90 | Ht 67.5 in | Wt 277.0 lb

## 2018-11-13 DIAGNOSIS — I471 Supraventricular tachycardia, unspecified: Secondary | ICD-10-CM

## 2018-11-13 DIAGNOSIS — I4719 Other supraventricular tachycardia: Secondary | ICD-10-CM

## 2018-11-13 DIAGNOSIS — I10 Essential (primary) hypertension: Secondary | ICD-10-CM

## 2018-11-13 DIAGNOSIS — R55 Syncope and collapse: Secondary | ICD-10-CM

## 2018-11-13 DIAGNOSIS — E785 Hyperlipidemia, unspecified: Secondary | ICD-10-CM | POA: Diagnosis not present

## 2018-11-13 NOTE — Patient Instructions (Signed)
Medication Instructions:  Your physician recommends that you continue on your current medications as directed. Please refer to the Current Medication list given to you today.  If you need a refill on your cardiac medications before your next appointment, please call your pharmacy.   Lab work: None today If you have labs (blood work) drawn today and your tests are completely normal, you will receive your results only by: Marland Kitchen MyChart Message (if you have MyChart) OR . A paper copy in the mail If you have any lab test that is abnormal or we need to change your treatment, we will call you to review the results.  Testing/Procedures: None  Follow-Up: At [facility], you and your health needs are our priority.  As part of our continuing mission to provide you with exceptional heart care, we have created designated Provider Care Teams.  These Care Teams include your primary Cardiologist (physician) and Advanced Practice Providers (APPs -  Physician Assistants and Nurse Practitioners) who all work together to provide you with the care you need, when you need it. You will need a follow up appointment in 6 months.  Please call our office 2 months in advance to schedule this appointment.  You may see Kate Sable, MD or one of the following Advanced Practice Providers on your designated Care Team:   Bernerd Pho, PA-C [facility]) . Ermalinda Barrios, PA-C ([place])  Any Other Special Instructions Will Be Listed Below (If Applicable). None

## 2018-11-13 NOTE — Progress Notes (Addendum)
SUBJECTIVE: The patient presents for routine follow-up.  She was diagnosed with SVT in May 2019.  It appeared to be a long RP tachycardia and likely an atrial tachycardia.  Past medical history includes triple negative high-grade invasive ductal carcinoma of the left breast treated with Cytoxan and Adriamycin.  She also has type 2 diabetes mellitus, diffuse arthritis and fibromyalgia.  She underwent a normal coronary angiogram on 04/26/2004.  She subsequently underwent a normal nuclear stress test on 03/03/2010.  The patient wore an event monitor for a brief period of time which showed sinus rhythm.  Echocardiogram on 04/24/2018 showed normal left ventricular systolic function and regional wall motion, LVEF 55 to 60%, mild LVH, and grade 1 diastolic dysfunction.  The patient denies any symptoms of chest pain, palpitations, shortness of breath, lightheadedness, dizziness, leg swelling, orthopnea, PND, and syncope.  Her primary complaints today relate to neuropathy of the hands and the feet after chemotherapy.  She received a total of 16 treatments.  She did describe 5 episodes of near syncope which have occurred while sitting and once while driving.  She is checked her blood sugars and they were normal.  She is checked her blood pressure over the past 2 months and it is also been normal.    Review of Systems: As per "subjective", otherwise negative.  Allergies  Allergen Reactions  . Magnesium Sulfate Shortness Of Breath    Experienced shortness of breath and chest pain during mag sulfate infusion. -11/21/17 -gwd  . Other Anaphylaxis and Other (See Comments)    Lysol and other cleaning products  . Cortisone     Patient passed out   . Iohexol Other (See Comments)     Code: HIVES, Desc: PT STATES SHE WAS GIVEN IV DYE AT [facility] AND BROKE OUT IN HIVES, RASH, AND HAD TO BE HOSPITALIZED DUE TO REACTION   . Ivp Dye [Iodinated Diagnostic Agents] Other (See Comments)    Unknown  .  Januvia [Sitagliptin]     Stomach pains   . Latex Itching  . Motrin [Ibuprofen] Other (See Comments)    Unknown  . Nsaids Other (See Comments)    Unknown  . Statins Other (See Comments)    Unknown  . Sulfa Antibiotics Other (See Comments)    Unknown  . Adhesive [Tape] Rash    Current Outpatient Medications  Medication Sig Dispense Refill  . albuterol (PROVENTIL) (2.5 MG/3ML) 0.083% nebulizer solution Take 2.5 mg by nebulization every 6 (six) hours as needed for wheezing or shortness of breath.     . ALPRAZolam (XANAX) 0.5 MG tablet Take 0.5 mg by mouth 4 (four) times daily as needed for anxiety.     . Cholecalciferol (VITAMIN D3) 5000 units CAPS Take 5,000 Units by mouth daily.    Marland Kitchen HYDROcodone-acetaminophen (NORCO) 7.5-325 MG tablet TAKE ONE TABLET BY MOUTH FOUR TIMES DAILY AS NEEDED FOR PAIN.    . hydrOXYzine (ATARAX/VISTARIL) 50 MG tablet Take 50 mg by mouth every other day. Every other day at bedtime and as needed    . magnesium oxide (MAG-OX) 400 (241.3 Mg) MG tablet Take 1 tablet (400 mg total) by mouth 3 (three) times daily between meals. (Patient taking differently: Take 400 mg by mouth daily. ) 90 tablet 6  . metFORMIN (GLUCOPHAGE) 500 MG tablet Take 500 mg by mouth 2 (two) times daily with a meal.     . metoprolol succinate (TOPROL-XL) 100 MG 24 hr tablet Take 1 tablet (100 mg total)  by mouth daily. Take with or immediately following a meal. 30 tablet 6  . Misc. Devices MISC Please provide the patient with one pneumatic compression pump for lymphedema treatment 1 each 0  . omeprazole (PRILOSEC) 40 MG capsule Take 40 mg every other day by mouth. At night    . potassium chloride SA (KLOR-CON M20) 20 MEQ tablet Take 2 tablets (40 mEq total) by mouth daily. 180 tablet 3  . pravastatin (PRAVACHOL) 40 MG tablet Take 40 mg by mouth daily.    . promethazine (PHENERGAN) 25 MG tablet Take 1 tablet (25 mg total) by mouth every 6 (six) hours as needed for nausea or vomiting. 30 tablet 3  .  diphenoxylate-atropine (LOMOTIL) 2.5-0.025 MG tablet Take 1 tablet by mouth 4 (four) times daily as needed for diarrhea or loose stools. (Patient not taking: Reported on 09/10/2018) 30 tablet 0  . nitroGLYCERIN (NITROSTAT) 0.4 MG SL tablet Place 0.4 mg under the tongue every 5 (five) minutes as needed for chest pain.     No current facility-administered medications for this visit.     Past Medical History:  Diagnosis Date  . Anxiety   . Arthritis   . Back pain   . Byssinosis ([place])    secondary to working in a [employer]  . Chest pain   . Chronic pain   . Depression   . Diabetes mellitus without complication ([place])   . Fibromyalgia   . GERD (gastroesophageal reflux disease)   . Hypercholesteremia   . Hypertension   . Hypokalemia   . Hypomagnesemia   . Knee pain   . Obesity   . Peptic ulcer    history of - normal GI studies Sept 2010  . PSVT (paroxysmal supraventricular tachycardia) ([place])    a. dx 03/2018 - long RP likely atrial tachycardia.    Past Surgical History:  Procedure Laterality Date  . APPENDECTOMY    . bladder rectal repair    . BREAST SURGERY Left    breast biopsy- benign  . CHOLECYSTECTOMY    . DILATION AND CURETTAGE OF UTERUS    . GANGLION CYST EXCISION Left   . MASTECTOMY MODIFIED RADICAL Left 09/15/2017   Procedure: LEFT MODIFIED RADICAL MASTECTOMY;  Surgeon: Aviva Signs, MD;  Location: [facility];  Service: General;  Laterality: Left;  . PORTACATH PLACEMENT Right 10/13/2017   Procedure: INSERTION PORT-A-CATH RIGHT SUBCLAVIAN;  Surgeon: Aviva Signs, MD;  Location: [facility];  Service: General;  Laterality: Right;  . TOTAL ABDOMINAL HYSTERECTOMY      Social History   Socioeconomic History  . Marital status: Married    Spouse name: Not on file  . Number of children: Not on file  . Years of education: Not on file  . Highest education level: Not on file  Occupational History  . 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 Financial resource strain: Not on file  . Food insecurity:    Worry: Not on file    Inability: Not on file  . Transportation needs:    Medical: Not on file    Non-medical: Not on file  Tobacco Use  . Smoking status: Never Smoker  . Smokeless tobacco: Current User    Types: Snuff  Substance and Sexual Activity  . Alcohol use: No  . Drug use: No  . Sexual activity: Never    Birth control/protection: Surgical  Lifestyle  . Physical activity:    Days per  week: Not on file    Minutes per session: Not on file  . Stress: Not on file  Relationships  . Social connections:    Talks on phone: Not on file    Gets together: Not on file    Attends religious service: Not on file    Active member of club or organization: Not on file    Attends meetings of clubs or organizations: Not on file    Relationship status: Not on file  . Intimate partner violence:    Fear of current or ex partner: Not on file    Emotionally abused: Not on file    Physically abused: Not on file    Forced sexual activity: Not on file  Other Topics Concern  . Not on file  Social History Narrative  . Not on file   A nurse/CMA was present throughout the physical exam.   Vitals:   11/13/18 1025  BP: 132/76  Pulse: 90  SpO2: 98%  Weight: 277 lb (125.6 kg)  Height: 5' 7.5" (1.715 m)    Wt Readings from Last 3 Encounters:  11/13/18 277 lb (125.6 kg)  09/10/18 271 lb (122.9 kg)  08/06/18 266 lb 14.4 oz (121.1 kg)     PHYSICAL EXAM General: NAD HEENT: Normal. Neck: No JVD, no thyromegaly. Lungs: Clear to auscultation bilaterally with normal respiratory effort. CV: Regular rate and rhythm, normal S1/S2, no S3/S4, no murmur. No pretibial or periankle edema.  Abdomen: Soft, nontender, no distention.  Neurologic: Alert and oriented.  Psych: Normal affect. Skin: Normal. Musculoskeletal: No gross deformities.    ECG: Reviewed above under Subjective   Labs: Lab Results  Component  Value Date/Time   K 4.0 07/30/2018 11:19 AM   BUN 12 07/30/2018 11:19 AM   CREATININE 0.93 07/30/2018 11:19 AM   ALT 21 07/30/2018 11:19 AM   TSH 1.613 03/31/2018 02:20 PM   HGB 12.9 07/30/2018 11:19 AM     Lipids: Lab Results  Component Value Date/Time   LDLCALC 77 02/16/2018 11:22 AM   CHOL 166 02/16/2018 11:22 AM   TRIG 196 (H) 02/16/2018 11:22 AM   HDL 50 02/16/2018 11:22 AM       ASSESSMENT AND PLAN: 1.  PSVT/atrial tachycardia: Symptomatically stable on Toprol-XL 100 mg daily.  No changes.  2.  Hyperlipidemia: Continue pravastatin.  3.  Near syncope: Unclear etiology.  I asked her to continue to check blood pressures during these episodes.  4.  Hypertension: Controlled on present therapy.  No changes.   Disposition: Follow up 6 months   Kate Sable, M.D., F.A.C.C.

## 2018-12-09 ENCOUNTER — Encounter: Payer: Self-pay | Admitting: Orthopaedic Surgery

## 2018-12-09 ENCOUNTER — Ambulatory Visit: Payer: Medicare Other | Admitting: Orthopaedic Surgery

## 2018-12-09 VITALS — BP 147/83 | HR 88 | Ht 67.5 in | Wt 277.0 lb

## 2018-12-09 DIAGNOSIS — Z6841 Body Mass Index (BMI) 40.0 and over, adult: Secondary | ICD-10-CM | POA: Diagnosis not present

## 2018-12-09 DIAGNOSIS — M545 Low back pain: Secondary | ICD-10-CM | POA: Diagnosis not present

## 2018-12-09 DIAGNOSIS — G8929 Other chronic pain: Secondary | ICD-10-CM

## 2018-12-09 NOTE — Progress Notes (Signed)
Patient Kelli Wilson, female DOB:Mar 10, 1951, 67 y.o. VVZ:[id number]  Chief Complaint  Patient presents with  . Back Pain    HPI  Kelli Wilson is a 68 y.o. female who has lower back pain chronically.  She is stable. She has pain on the cold days we have had.  She is doing her exercises and taking her medicine.   Body mass index is 42.74 kg/m.  The patient meets the AMA guidelines for Morbid (severe) obesity with a BMI > 40.0 and I have recommended weight loss.   ROS  Review of Systems  Constitutional:       She has diabetes diet controlled. She has COPD  HENT: Positive for congestion.   Respiratory: Positive for shortness of breath. Negative for cough.   Cardiovascular: Negative for chest pain and leg swelling.  Endocrine: Positive for cold intolerance.  Musculoskeletal: Positive for arthralgias, back pain and myalgias.  Allergic/Immunologic: Positive for environmental allergies and food allergies.  Psychiatric/Behavioral: The patient is nervous/anxious.   All other systems reviewed and are negative.   All other systems reviewed and are negative.  The following is a summary of the past history medically, past history surgically, known current medicines, social history and family history.  This information is gathered electronically by the computer from prior information and documentation.  I review this each visit and have found including this information at this point in the chart is beneficial and informative.    Past Medical History:  Diagnosis Date  . Anxiety   . Arthritis   . Back pain   . Byssinosis ([place])    secondary to working in a [employer]  . Chest pain   . Chronic pain   . Depression   . Diabetes mellitus without complication ([place])   . Fibromyalgia   . GERD (gastroesophageal reflux disease)   . Hypercholesteremia   . Hypertension   . Hypokalemia   . Hypomagnesemia   . Knee pain   . Obesity   . Peptic ulcer    history of - normal GI  studies Sept 2010  . PSVT (paroxysmal supraventricular tachycardia) ([place])    a. dx 03/2018 - long RP likely atrial tachycardia.    Past Surgical History:  Procedure Laterality Date  . APPENDECTOMY    . bladder rectal repair    . BREAST SURGERY Left    breast biopsy- benign  . CHOLECYSTECTOMY    . DILATION AND CURETTAGE OF UTERUS    . GANGLION CYST EXCISION Left   . MASTECTOMY MODIFIED RADICAL Left 09/15/2017   Procedure: LEFT MODIFIED RADICAL MASTECTOMY;  Surgeon: Aviva Signs, MD;  Location: [facility];  Service: General;  Laterality: Left;  . PORTACATH PLACEMENT Right 10/13/2017   Procedure: INSERTION PORT-A-CATH RIGHT SUBCLAVIAN;  Surgeon: Aviva Signs, MD;  Location: [facility];  Service: General;  Laterality: Right;  . TOTAL ABDOMINAL HYSTERECTOMY      Family History  Problem Relation Age of Onset  . Aneurysm Father   . Heart disease Father   . COPD Mother   . Diabetes Mother     Social History Social History   Tobacco Use  . Smoking status: Never Smoker  . Smokeless tobacco: Current User    Types: Snuff  Substance Use Topics  . Alcohol use: No  . Drug use: No    Allergies  Allergen Reactions  . Magnesium Sulfate Shortness Of Breath    Experienced shortness of breath and chest pain during mag sulfate infusion. -11/21/17 -gwd  .  Other Anaphylaxis and Other (See Comments)    Lysol and other cleaning products  . Cortisone     Patient passed out   . Iohexol Other (See Comments)     Code: HIVES, Desc: PT STATES SHE WAS GIVEN IV DYE AT [facility] AND BROKE OUT IN HIVES, RASH, AND HAD TO BE HOSPITALIZED DUE TO REACTION   . Ivp Dye [Iodinated Diagnostic Agents] Other (See Comments)    Unknown  . Januvia [Sitagliptin]     Stomach pains   . Latex Itching  . Motrin [Ibuprofen] Other (See Comments)    Unknown  . Nsaids Other (See Comments)    Unknown  . Statins Other (See Comments)    Unknown  . Sulfa Antibiotics Other (See Comments)    Unknown  . Adhesive [Tape] Rash     Current Outpatient Medications  Medication Sig Dispense Refill  . albuterol (PROVENTIL) (2.5 MG/3ML) 0.083% nebulizer solution Take 2.5 mg by nebulization every 6 (six) hours as needed for wheezing or shortness of breath.     . ALPRAZolam (XANAX) 0.5 MG tablet Take 0.5 mg by mouth 4 (four) times daily as needed for anxiety.     . Cholecalciferol (VITAMIN D3) 5000 units CAPS Take 5,000 Units by mouth daily.    . diphenoxylate-atropine (LOMOTIL) 2.5-0.025 MG tablet Take 1 tablet by mouth 4 (four) times daily as needed for diarrhea or loose stools. (Patient not taking: Reported on 09/10/2018) 30 tablet 0  . HYDROcodone-acetaminophen (NORCO) 7.5-325 MG tablet TAKE ONE TABLET BY MOUTH FOUR TIMES DAILY AS NEEDED FOR PAIN.    . hydrOXYzine (ATARAX/VISTARIL) 50 MG tablet Take 50 mg by mouth every other day. Every other day at bedtime and as needed    . magnesium oxide (MAG-OX) 400 (241.3 Mg) MG tablet Take 1 tablet (400 mg total) by mouth 3 (three) times daily between meals. (Patient taking differently: Take 400 mg by mouth daily. ) 90 tablet 6  . metFORMIN (GLUCOPHAGE) 500 MG tablet Take 500 mg by mouth 2 (two) times daily with a meal.     . metoprolol succinate (TOPROL-XL) 100 MG 24 hr tablet Take 1 tablet (100 mg total) by mouth daily. Take with or immediately following a meal. 30 tablet 6  . Misc. Devices MISC Please provide the patient with one pneumatic compression pump for lymphedema treatment 1 each 0  . nitroGLYCERIN (NITROSTAT) 0.4 MG SL tablet Place 0.4 mg under the tongue every 5 (five) minutes as needed for chest pain.    Marland Kitchen omeprazole (PRILOSEC) 40 MG capsule Take 40 mg every other day by mouth. At night    . potassium chloride SA (KLOR-CON M20) 20 MEQ tablet Take 2 tablets (40 mEq total) by mouth daily. 180 tablet 3  . pravastatin (PRAVACHOL) 40 MG tablet Take 40 mg by mouth daily.    . promethazine (PHENERGAN) 25 MG tablet Take 1 tablet (25 mg total) by mouth every 6 (six) hours as  needed for nausea or vomiting. 30 tablet 3   No current facility-administered medications for this visit.      Physical Exam  Blood pressure (!) 147/83, pulse 88, height 5' 7.5" (1.715 m), weight 277 lb (125.6 kg).  Constitutional: overall normal hygiene, normal nutrition, well developed, normal grooming, normal body habitus. Assistive device:none  Musculoskeletal: gait and station Limp none, muscle tone and strength are normal, no tremors or atrophy is present.  .  Neurological: coordination overall normal.  Deep tendon reflex/nerve stretch intact.  Sensation normal.  Cranial  nerves II-XII intact.   Skin:   Normal overall no scars, lesions, ulcers or rashes. No psoriasis.  Psychiatric: Alert and oriented x 3.  Recent memory intact, remote memory unclear.  Normal mood and affect. Well groomed.  Good eye contact.  Cardiovascular: overall no swelling, no varicosities, no edema bilaterally, normal temperatures of the legs and arms, no clubbing, cyanosis and good capillary refill.  Lymphatic: palpation is normal.  Spine/Pelvis examination:  Inspection:  Overall, sacoiliac joint benign and hips nontender; without crepitus or defects.   Thoracic spine inspection: Alignment normal without kyphosis present   Lumbar spine inspection:  Alignment  with normal lumbar lordosis, without scoliosis apparent.   Thoracic spine palpation:  without tenderness of spinal processes   Lumbar spine palpation: without tenderness of lumbar area; without tightness of lumbar muscles    Range of Motion:   Lumbar flexion, forward flexion is normal without pain or tenderness    Lumbar extension is full without pain or tenderness   Left lateral bend is normal without pain or tenderness   Right lateral bend is normal without pain or tenderness   Straight leg raising is normal  Strength & tone: normal   Stability overall normal stability  All other systems reviewed and are negative   The patient has  been educated about the nature of the problem(s) and counseled on treatment options.  The patient appeared to understand what I have discussed and is in agreement with it.  Encounter Diagnoses  Name Primary?  . Chronic midline low back pain without sciatica Yes  . Body mass index 40.0-44.9, adult ([place])   . Morbid obesity ([place])     PLAN Call if any problems.  Precautions discussed.  Continue current medications.   Return to clinic 3 months   Electronically Signed Sanjuana Kava, MD 1/15/20202:39 PM

## 2018-12-10 ENCOUNTER — Ambulatory Visit: Payer: Medicare Other | Admitting: Orthopaedic Surgery

## 2018-12-11 ENCOUNTER — Ambulatory Visit: Payer: Self-pay | Admitting: *Deleted

## 2018-12-21 DIAGNOSIS — C50112 Malignant neoplasm of central portion of left female breast: Secondary | ICD-10-CM | POA: Diagnosis not present

## 2018-12-21 DIAGNOSIS — Z171 Estrogen receptor negative status [ER-]: Secondary | ICD-10-CM | POA: Diagnosis not present

## 2018-12-23 DIAGNOSIS — C50112 Malignant neoplasm of central portion of left female breast: Secondary | ICD-10-CM | POA: Diagnosis not present

## 2018-12-23 DIAGNOSIS — G8918 Other acute postprocedural pain: Secondary | ICD-10-CM | POA: Diagnosis not present

## 2018-12-23 DIAGNOSIS — R222 Localized swelling, mass and lump, trunk: Secondary | ICD-10-CM | POA: Diagnosis not present

## 2018-12-23 DIAGNOSIS — Z171 Estrogen receptor negative status [ER-]: Secondary | ICD-10-CM | POA: Diagnosis not present

## 2018-12-24 ENCOUNTER — Other Ambulatory Visit: Payer: Self-pay | Admitting: Orthopaedic Surgery

## 2018-12-24 ENCOUNTER — Other Ambulatory Visit: Payer: Self-pay | Admitting: Oncology

## 2018-12-24 ENCOUNTER — Other Ambulatory Visit (HOSPITAL_COMMUNITY): Payer: Self-pay | Admitting: Oncology

## 2018-12-24 DIAGNOSIS — C50112 Malignant neoplasm of central portion of left female breast: Secondary | ICD-10-CM

## 2018-12-24 DIAGNOSIS — Z171 Estrogen receptor negative status [ER-]: Principal | ICD-10-CM

## 2018-12-28 DIAGNOSIS — J060 Acute laryngopharyngitis: Secondary | ICD-10-CM | POA: Diagnosis not present

## 2018-12-28 DIAGNOSIS — J441 Chronic obstructive pulmonary disease with (acute) exacerbation: Secondary | ICD-10-CM | POA: Diagnosis not present

## 2018-12-29 DIAGNOSIS — R509 Fever, unspecified: Secondary | ICD-10-CM | POA: Diagnosis not present

## 2018-12-29 DIAGNOSIS — R0602 Shortness of breath: Secondary | ICD-10-CM | POA: Diagnosis not present

## 2018-12-29 DIAGNOSIS — J449 Chronic obstructive pulmonary disease, unspecified: Secondary | ICD-10-CM | POA: Diagnosis not present

## 2018-12-30 DIAGNOSIS — N6321 Unspecified lump in the left breast, upper outer quadrant: Secondary | ICD-10-CM | POA: Diagnosis not present

## 2018-12-30 DIAGNOSIS — Z171 Estrogen receptor negative status [ER-]: Secondary | ICD-10-CM | POA: Diagnosis not present

## 2018-12-30 DIAGNOSIS — N6489 Other specified disorders of breast: Secondary | ICD-10-CM | POA: Diagnosis not present

## 2018-12-30 DIAGNOSIS — Z9889 Other specified postprocedural states: Secondary | ICD-10-CM | POA: Diagnosis not present

## 2018-12-30 DIAGNOSIS — C50112 Malignant neoplasm of central portion of left female breast: Secondary | ICD-10-CM | POA: Diagnosis not present

## 2018-12-30 DIAGNOSIS — C50912 Malignant neoplasm of unspecified site of left female breast: Secondary | ICD-10-CM | POA: Diagnosis not present

## 2018-12-30 DIAGNOSIS — N6320 Unspecified lump in the left breast, unspecified quadrant: Secondary | ICD-10-CM | POA: Diagnosis not present

## 2019-01-06 DIAGNOSIS — C50112 Malignant neoplasm of central portion of left female breast: Secondary | ICD-10-CM | POA: Diagnosis not present

## 2019-01-06 DIAGNOSIS — R222 Localized swelling, mass and lump, trunk: Secondary | ICD-10-CM | POA: Diagnosis not present

## 2019-01-06 DIAGNOSIS — Z171 Estrogen receptor negative status [ER-]: Secondary | ICD-10-CM | POA: Diagnosis not present

## 2019-01-08 ENCOUNTER — Encounter: Payer: Self-pay | Admitting: General Practice

## 2019-01-11 ENCOUNTER — Other Ambulatory Visit (HOSPITAL_COMMUNITY): Payer: Self-pay | Admitting: Oncology

## 2019-01-11 ENCOUNTER — Other Ambulatory Visit: Payer: Self-pay | Admitting: Oncology

## 2019-01-11 DIAGNOSIS — R222 Localized swelling, mass and lump, trunk: Secondary | ICD-10-CM

## 2019-01-12 ENCOUNTER — Other Ambulatory Visit (HOSPITAL_COMMUNITY): Payer: Self-pay | Admitting: Oncology

## 2019-01-18 ENCOUNTER — Encounter (HOSPITAL_COMMUNITY): Payer: Self-pay

## 2019-01-18 ENCOUNTER — Ambulatory Visit (HOSPITAL_COMMUNITY): Payer: Medicare Other

## 2019-01-18 ENCOUNTER — Other Ambulatory Visit (HOSPITAL_COMMUNITY): Payer: Self-pay | Admitting: Oncology

## 2019-01-18 ENCOUNTER — Other Ambulatory Visit: Payer: Self-pay | Admitting: Oncology

## 2019-01-18 ENCOUNTER — Ambulatory Visit (HOSPITAL_COMMUNITY)
Admission: RE | Admit: 2019-01-18 | Discharge: 2019-01-18 | Disposition: A | Discharge status: Home / Self Care | Payer: Medicare Other | Source: Ambulatory Visit | Attending: Oncology | Admitting: Oncology

## 2019-01-18 DIAGNOSIS — C50112 Malignant neoplasm of central portion of left female breast: Secondary | ICD-10-CM

## 2019-01-18 DIAGNOSIS — R222 Localized swelling, mass and lump, trunk: Secondary | ICD-10-CM

## 2019-01-20 DIAGNOSIS — C7989 Secondary malignant neoplasm of other specified sites: Secondary | ICD-10-CM | POA: Diagnosis not present

## 2019-01-20 DIAGNOSIS — R222 Localized swelling, mass and lump, trunk: Secondary | ICD-10-CM | POA: Diagnosis not present

## 2019-01-20 DIAGNOSIS — C50912 Malignant neoplasm of unspecified site of left female breast: Secondary | ICD-10-CM | POA: Diagnosis not present

## 2019-01-20 DIAGNOSIS — Z171 Estrogen receptor negative status [ER-]: Secondary | ICD-10-CM | POA: Diagnosis not present

## 2019-01-20 DIAGNOSIS — C50112 Malignant neoplasm of central portion of left female breast: Secondary | ICD-10-CM | POA: Diagnosis not present

## 2019-01-22 ENCOUNTER — Ambulatory Visit (HOSPITAL_COMMUNITY)
Admission: RE | Admit: 2019-01-22 | Discharge: 2019-01-22 | Disposition: A | Discharge status: Home / Self Care | Payer: Medicare Other | Source: Ambulatory Visit | Attending: Oncology | Admitting: Oncology

## 2019-01-22 DIAGNOSIS — C50912 Malignant neoplasm of unspecified site of left female breast: Secondary | ICD-10-CM | POA: Diagnosis not present

## 2019-01-22 DIAGNOSIS — R222 Localized swelling, mass and lump, trunk: Secondary | ICD-10-CM | POA: Insufficient documentation

## 2019-01-22 LAB — GLUCOSE, CAPILLARY: Glucose-Capillary: 125 mg/dL — ABNORMAL HIGH (ref 70–99)

## 2019-01-22 MED ORDER — FLUDEOXYGLUCOSE F - 18 (FDG) INJECTION
13.8000 | Freq: Once | INTRAVENOUS | Status: AC | PRN
  Administered 2019-01-22: 13.8 via INTRAVENOUS

## 2019-01-28 DIAGNOSIS — I10 Essential (primary) hypertension: Secondary | ICD-10-CM | POA: Diagnosis not present

## 2019-01-28 DIAGNOSIS — C7989 Secondary malignant neoplasm of other specified sites: Secondary | ICD-10-CM | POA: Diagnosis not present

## 2019-01-28 DIAGNOSIS — C50412 Malignant neoplasm of upper-outer quadrant of left female breast: Secondary | ICD-10-CM | POA: Diagnosis not present

## 2019-01-28 DIAGNOSIS — C50912 Malignant neoplasm of unspecified site of left female breast: Secondary | ICD-10-CM | POA: Diagnosis not present

## 2019-01-28 DIAGNOSIS — Z95828 Presence of other vascular implants and grafts: Secondary | ICD-10-CM | POA: Diagnosis not present

## 2019-02-01 DIAGNOSIS — E1165 Type 2 diabetes mellitus with hyperglycemia: Secondary | ICD-10-CM | POA: Diagnosis not present

## 2019-02-01 DIAGNOSIS — I10 Essential (primary) hypertension: Secondary | ICD-10-CM | POA: Diagnosis not present

## 2019-02-01 DIAGNOSIS — N183 Chronic kidney disease, stage 3 (moderate): Secondary | ICD-10-CM | POA: Diagnosis not present

## 2019-02-01 DIAGNOSIS — E782 Mixed hyperlipidemia: Secondary | ICD-10-CM | POA: Diagnosis not present

## 2019-02-08 DIAGNOSIS — C7989 Secondary malignant neoplasm of other specified sites: Secondary | ICD-10-CM | POA: Diagnosis not present

## 2019-02-08 DIAGNOSIS — C50912 Malignant neoplasm of unspecified site of left female breast: Secondary | ICD-10-CM | POA: Diagnosis not present

## 2019-02-08 DIAGNOSIS — N183 Chronic kidney disease, stage 3 (moderate): Secondary | ICD-10-CM | POA: Diagnosis not present

## 2019-02-08 DIAGNOSIS — E782 Mixed hyperlipidemia: Secondary | ICD-10-CM | POA: Diagnosis not present

## 2019-02-08 DIAGNOSIS — E1122 Type 2 diabetes mellitus with diabetic chronic kidney disease: Secondary | ICD-10-CM | POA: Diagnosis not present

## 2019-02-08 DIAGNOSIS — I10 Essential (primary) hypertension: Secondary | ICD-10-CM | POA: Diagnosis not present

## 2019-02-08 DIAGNOSIS — C50412 Malignant neoplasm of upper-outer quadrant of left female breast: Secondary | ICD-10-CM | POA: Diagnosis not present

## 2019-02-08 DIAGNOSIS — K219 Gastro-esophageal reflux disease without esophagitis: Secondary | ICD-10-CM | POA: Diagnosis not present

## 2019-02-08 DIAGNOSIS — Z95828 Presence of other vascular implants and grafts: Secondary | ICD-10-CM | POA: Diagnosis not present

## 2019-02-09 DIAGNOSIS — C50912 Malignant neoplasm of unspecified site of left female breast: Secondary | ICD-10-CM | POA: Diagnosis not present

## 2019-02-09 DIAGNOSIS — C7989 Secondary malignant neoplasm of other specified sites: Secondary | ICD-10-CM | POA: Diagnosis not present

## 2019-02-09 DIAGNOSIS — C50412 Malignant neoplasm of upper-outer quadrant of left female breast: Secondary | ICD-10-CM | POA: Diagnosis not present

## 2019-02-09 DIAGNOSIS — I361 Nonrheumatic tricuspid (valve) insufficiency: Secondary | ICD-10-CM | POA: Diagnosis not present

## 2019-02-09 DIAGNOSIS — I10 Essential (primary) hypertension: Secondary | ICD-10-CM | POA: Diagnosis not present

## 2019-02-11 DIAGNOSIS — Z5111 Encounter for antineoplastic chemotherapy: Secondary | ICD-10-CM | POA: Diagnosis not present

## 2019-02-11 DIAGNOSIS — C50412 Malignant neoplasm of upper-outer quadrant of left female breast: Secondary | ICD-10-CM | POA: Diagnosis not present

## 2019-02-11 DIAGNOSIS — Z171 Estrogen receptor negative status [ER-]: Secondary | ICD-10-CM | POA: Diagnosis not present

## 2019-02-12 DIAGNOSIS — Z7689 Persons encountering health services in other specified circumstances: Secondary | ICD-10-CM | POA: Diagnosis not present

## 2019-02-12 DIAGNOSIS — C50412 Malignant neoplasm of upper-outer quadrant of left female breast: Secondary | ICD-10-CM | POA: Diagnosis not present

## 2019-02-12 DIAGNOSIS — Z171 Estrogen receptor negative status [ER-]: Secondary | ICD-10-CM | POA: Diagnosis not present

## 2019-02-15 DIAGNOSIS — B354 Tinea corporis: Secondary | ICD-10-CM | POA: Diagnosis not present

## 2019-02-15 DIAGNOSIS — T451X5A Adverse effect of antineoplastic and immunosuppressive drugs, initial encounter: Secondary | ICD-10-CM | POA: Diagnosis not present

## 2019-02-15 DIAGNOSIS — C50412 Malignant neoplasm of upper-outer quadrant of left female breast: Secondary | ICD-10-CM | POA: Diagnosis not present

## 2019-02-15 DIAGNOSIS — C50912 Malignant neoplasm of unspecified site of left female breast: Secondary | ICD-10-CM | POA: Diagnosis not present

## 2019-02-15 DIAGNOSIS — Z171 Estrogen receptor negative status [ER-]: Secondary | ICD-10-CM | POA: Diagnosis not present

## 2019-02-15 DIAGNOSIS — Z95828 Presence of other vascular implants and grafts: Secondary | ICD-10-CM | POA: Diagnosis not present

## 2019-02-16 DIAGNOSIS — Z171 Estrogen receptor negative status [ER-]: Secondary | ICD-10-CM | POA: Diagnosis not present

## 2019-02-16 DIAGNOSIS — C50412 Malignant neoplasm of upper-outer quadrant of left female breast: Secondary | ICD-10-CM | POA: Diagnosis not present

## 2019-02-17 DIAGNOSIS — M797 Fibromyalgia: Secondary | ICD-10-CM | POA: Diagnosis not present

## 2019-02-17 DIAGNOSIS — Z7984 Long term (current) use of oral hypoglycemic drugs: Secondary | ICD-10-CM | POA: Diagnosis not present

## 2019-02-17 DIAGNOSIS — R52 Pain, unspecified: Secondary | ICD-10-CM | POA: Diagnosis not present

## 2019-02-17 DIAGNOSIS — E860 Dehydration: Secondary | ICD-10-CM | POA: Diagnosis not present

## 2019-02-17 DIAGNOSIS — E119 Type 2 diabetes mellitus without complications: Secondary | ICD-10-CM | POA: Diagnosis not present

## 2019-02-17 DIAGNOSIS — Z79899 Other long term (current) drug therapy: Secondary | ICD-10-CM | POA: Diagnosis not present

## 2019-02-17 DIAGNOSIS — R1111 Vomiting without nausea: Secondary | ICD-10-CM | POA: Diagnosis not present

## 2019-02-17 DIAGNOSIS — J449 Chronic obstructive pulmonary disease, unspecified: Secondary | ICD-10-CM | POA: Diagnosis not present

## 2019-02-17 DIAGNOSIS — Z95828 Presence of other vascular implants and grafts: Secondary | ICD-10-CM | POA: Diagnosis not present

## 2019-02-17 DIAGNOSIS — K219 Gastro-esophageal reflux disease without esophagitis: Secondary | ICD-10-CM | POA: Diagnosis not present

## 2019-02-17 DIAGNOSIS — R21 Rash and other nonspecific skin eruption: Secondary | ICD-10-CM | POA: Diagnosis not present

## 2019-02-17 DIAGNOSIS — R110 Nausea: Secondary | ICD-10-CM | POA: Diagnosis not present

## 2019-02-17 DIAGNOSIS — R112 Nausea with vomiting, unspecified: Secondary | ICD-10-CM | POA: Diagnosis not present

## 2019-02-17 DIAGNOSIS — R197 Diarrhea, unspecified: Secondary | ICD-10-CM | POA: Diagnosis not present

## 2019-02-17 DIAGNOSIS — C50912 Malignant neoplasm of unspecified site of left female breast: Secondary | ICD-10-CM | POA: Diagnosis not present

## 2019-02-18 DIAGNOSIS — R112 Nausea with vomiting, unspecified: Secondary | ICD-10-CM | POA: Diagnosis not present

## 2019-02-18 DIAGNOSIS — E785 Hyperlipidemia, unspecified: Secondary | ICD-10-CM | POA: Diagnosis not present

## 2019-02-18 DIAGNOSIS — I10 Essential (primary) hypertension: Secondary | ICD-10-CM | POA: Diagnosis not present

## 2019-02-18 DIAGNOSIS — C50412 Malignant neoplasm of upper-outer quadrant of left female breast: Secondary | ICD-10-CM | POA: Diagnosis not present

## 2019-02-18 DIAGNOSIS — Z171 Estrogen receptor negative status [ER-]: Secondary | ICD-10-CM | POA: Diagnosis not present

## 2019-02-18 DIAGNOSIS — E119 Type 2 diabetes mellitus without complications: Secondary | ICD-10-CM | POA: Diagnosis not present

## 2019-02-18 DIAGNOSIS — Z95828 Presence of other vascular implants and grafts: Secondary | ICD-10-CM | POA: Diagnosis not present

## 2019-02-18 DIAGNOSIS — E860 Dehydration: Secondary | ICD-10-CM | POA: Diagnosis not present

## 2019-02-19 DIAGNOSIS — I10 Essential (primary) hypertension: Secondary | ICD-10-CM | POA: Diagnosis not present

## 2019-02-19 DIAGNOSIS — R197 Diarrhea, unspecified: Secondary | ICD-10-CM | POA: Diagnosis not present

## 2019-02-19 DIAGNOSIS — C50912 Malignant neoplasm of unspecified site of left female breast: Secondary | ICD-10-CM | POA: Diagnosis not present

## 2019-02-19 DIAGNOSIS — E785 Hyperlipidemia, unspecified: Secondary | ICD-10-CM | POA: Diagnosis not present

## 2019-02-19 DIAGNOSIS — E119 Type 2 diabetes mellitus without complications: Secondary | ICD-10-CM | POA: Diagnosis not present

## 2019-02-19 DIAGNOSIS — R112 Nausea with vomiting, unspecified: Secondary | ICD-10-CM | POA: Diagnosis not present

## 2019-02-19 DIAGNOSIS — R110 Nausea: Secondary | ICD-10-CM | POA: Diagnosis not present

## 2019-02-19 DIAGNOSIS — T451X5A Adverse effect of antineoplastic and immunosuppressive drugs, initial encounter: Secondary | ICD-10-CM | POA: Diagnosis not present

## 2019-02-20 DIAGNOSIS — R112 Nausea with vomiting, unspecified: Secondary | ICD-10-CM | POA: Diagnosis not present

## 2019-02-20 DIAGNOSIS — C50919 Malignant neoplasm of unspecified site of unspecified female breast: Secondary | ICD-10-CM | POA: Diagnosis not present

## 2019-02-20 DIAGNOSIS — E876 Hypokalemia: Secondary | ICD-10-CM | POA: Diagnosis not present

## 2019-02-21 DIAGNOSIS — E119 Type 2 diabetes mellitus without complications: Secondary | ICD-10-CM | POA: Diagnosis not present

## 2019-02-21 DIAGNOSIS — E876 Hypokalemia: Secondary | ICD-10-CM | POA: Diagnosis not present

## 2019-02-21 DIAGNOSIS — R112 Nausea with vomiting, unspecified: Secondary | ICD-10-CM | POA: Diagnosis not present

## 2019-02-21 DIAGNOSIS — R197 Diarrhea, unspecified: Secondary | ICD-10-CM | POA: Diagnosis not present

## 2019-02-21 DIAGNOSIS — C50919 Malignant neoplasm of unspecified site of unspecified female breast: Secondary | ICD-10-CM | POA: Diagnosis not present

## 2019-02-22 DIAGNOSIS — R109 Unspecified abdominal pain: Secondary | ICD-10-CM | POA: Diagnosis not present

## 2019-02-23 DIAGNOSIS — R197 Diarrhea, unspecified: Secondary | ICD-10-CM | POA: Diagnosis not present

## 2019-02-23 DIAGNOSIS — Z79899 Other long term (current) drug therapy: Secondary | ICD-10-CM | POA: Diagnosis not present

## 2019-02-23 DIAGNOSIS — K219 Gastro-esophageal reflux disease without esophagitis: Secondary | ICD-10-CM | POA: Diagnosis not present

## 2019-02-23 DIAGNOSIS — Z7984 Long term (current) use of oral hypoglycemic drugs: Secondary | ICD-10-CM | POA: Diagnosis not present

## 2019-02-23 DIAGNOSIS — C50919 Malignant neoplasm of unspecified site of unspecified female breast: Secondary | ICD-10-CM | POA: Diagnosis not present

## 2019-02-23 DIAGNOSIS — E860 Dehydration: Secondary | ICD-10-CM | POA: Diagnosis not present

## 2019-02-23 DIAGNOSIS — R112 Nausea with vomiting, unspecified: Secondary | ICD-10-CM | POA: Diagnosis not present

## 2019-02-23 DIAGNOSIS — Z91041 Radiographic dye allergy status: Secondary | ICD-10-CM | POA: Diagnosis not present

## 2019-02-23 DIAGNOSIS — E871 Hypo-osmolality and hyponatremia: Secondary | ICD-10-CM | POA: Diagnosis not present

## 2019-02-23 DIAGNOSIS — M797 Fibromyalgia: Secondary | ICD-10-CM | POA: Diagnosis not present

## 2019-02-23 DIAGNOSIS — N189 Chronic kidney disease, unspecified: Secondary | ICD-10-CM | POA: Diagnosis not present

## 2019-02-23 DIAGNOSIS — E119 Type 2 diabetes mellitus without complications: Secondary | ICD-10-CM | POA: Diagnosis not present

## 2019-02-23 DIAGNOSIS — Z9012 Acquired absence of left breast and nipple: Secondary | ICD-10-CM | POA: Diagnosis not present

## 2019-02-23 DIAGNOSIS — I10 Essential (primary) hypertension: Secondary | ICD-10-CM | POA: Diagnosis not present

## 2019-02-23 DIAGNOSIS — E441 Mild protein-calorie malnutrition: Secondary | ICD-10-CM | POA: Diagnosis not present

## 2019-02-23 DIAGNOSIS — Z882 Allergy status to sulfonamides status: Secondary | ICD-10-CM | POA: Diagnosis not present

## 2019-02-23 DIAGNOSIS — E785 Hyperlipidemia, unspecified: Secondary | ICD-10-CM | POA: Diagnosis not present

## 2019-02-23 DIAGNOSIS — N179 Acute kidney failure, unspecified: Secondary | ICD-10-CM | POA: Diagnosis not present

## 2019-02-23 DIAGNOSIS — Z91040 Latex allergy status: Secondary | ICD-10-CM | POA: Diagnosis not present

## 2019-02-23 DIAGNOSIS — E876 Hypokalemia: Secondary | ICD-10-CM | POA: Diagnosis not present

## 2019-02-23 DIAGNOSIS — C50912 Malignant neoplasm of unspecified site of left female breast: Secondary | ICD-10-CM | POA: Diagnosis not present

## 2019-02-26 DIAGNOSIS — I10 Essential (primary) hypertension: Secondary | ICD-10-CM | POA: Diagnosis not present

## 2019-02-26 DIAGNOSIS — C50912 Malignant neoplasm of unspecified site of left female breast: Secondary | ICD-10-CM | POA: Diagnosis not present

## 2019-02-26 DIAGNOSIS — Z171 Estrogen receptor negative status [ER-]: Secondary | ICD-10-CM | POA: Diagnosis not present

## 2019-02-26 DIAGNOSIS — C50412 Malignant neoplasm of upper-outer quadrant of left female breast: Secondary | ICD-10-CM | POA: Diagnosis not present

## 2019-02-26 DIAGNOSIS — C7989 Secondary malignant neoplasm of other specified sites: Secondary | ICD-10-CM | POA: Diagnosis not present

## 2019-02-26 DIAGNOSIS — T451X5D Adverse effect of antineoplastic and immunosuppressive drugs, subsequent encounter: Secondary | ICD-10-CM | POA: Diagnosis not present

## 2019-02-26 DIAGNOSIS — Z95828 Presence of other vascular implants and grafts: Secondary | ICD-10-CM | POA: Diagnosis not present

## 2019-03-01 DIAGNOSIS — Z95828 Presence of other vascular implants and grafts: Secondary | ICD-10-CM | POA: Diagnosis not present

## 2019-03-01 DIAGNOSIS — C50412 Malignant neoplasm of upper-outer quadrant of left female breast: Secondary | ICD-10-CM | POA: Diagnosis not present

## 2019-03-01 DIAGNOSIS — Z171 Estrogen receptor negative status [ER-]: Secondary | ICD-10-CM | POA: Diagnosis not present

## 2019-03-01 DIAGNOSIS — R350 Frequency of micturition: Secondary | ICD-10-CM | POA: Diagnosis not present

## 2019-03-02 DIAGNOSIS — C50412 Malignant neoplasm of upper-outer quadrant of left female breast: Secondary | ICD-10-CM | POA: Diagnosis not present

## 2019-03-02 DIAGNOSIS — Z171 Estrogen receptor negative status [ER-]: Secondary | ICD-10-CM | POA: Diagnosis not present

## 2019-03-02 DIAGNOSIS — Z452 Encounter for adjustment and management of vascular access device: Secondary | ICD-10-CM | POA: Diagnosis not present

## 2019-03-03 ENCOUNTER — Other Ambulatory Visit: Payer: Self-pay

## 2019-03-03 DIAGNOSIS — Z171 Estrogen receptor negative status [ER-]: Secondary | ICD-10-CM | POA: Diagnosis not present

## 2019-03-03 DIAGNOSIS — C50412 Malignant neoplasm of upper-outer quadrant of left female breast: Secondary | ICD-10-CM | POA: Diagnosis not present

## 2019-03-03 NOTE — Patient Outreach (Signed)
[facility]) Care Management  03/03/2019  JOLANTA CABEZA [birth year] [id number]   Medication Adherence call to Mrs, Kelli Wilson spoke with patient she is past due on Metformin 500 mg she is still taking 1 tablet 2 times a day but patient has been in the hospital for the past week and just pick up the prescription form the pharmacy. [name] is showing past due under [place].   [place] Management Direct Dial [phone]  Fax [phone] [email or web address]

## 2019-03-04 DIAGNOSIS — Z95828 Presence of other vascular implants and grafts: Secondary | ICD-10-CM | POA: Diagnosis not present

## 2019-03-04 DIAGNOSIS — Z171 Estrogen receptor negative status [ER-]: Secondary | ICD-10-CM | POA: Diagnosis not present

## 2019-03-04 DIAGNOSIS — C50412 Malignant neoplasm of upper-outer quadrant of left female breast: Secondary | ICD-10-CM | POA: Diagnosis not present

## 2019-03-09 DIAGNOSIS — M79661 Pain in right lower leg: Secondary | ICD-10-CM | POA: Diagnosis not present

## 2019-03-09 DIAGNOSIS — N179 Acute kidney failure, unspecified: Secondary | ICD-10-CM | POA: Diagnosis not present

## 2019-03-09 DIAGNOSIS — Z79899 Other long term (current) drug therapy: Secondary | ICD-10-CM | POA: Diagnosis not present

## 2019-03-09 DIAGNOSIS — R112 Nausea with vomiting, unspecified: Secondary | ICD-10-CM | POA: Diagnosis not present

## 2019-03-09 DIAGNOSIS — Z9012 Acquired absence of left breast and nipple: Secondary | ICD-10-CM | POA: Diagnosis not present

## 2019-03-09 DIAGNOSIS — M47819 Spondylosis without myelopathy or radiculopathy, site unspecified: Secondary | ICD-10-CM | POA: Diagnosis not present

## 2019-03-09 DIAGNOSIS — C7989 Secondary malignant neoplasm of other specified sites: Secondary | ICD-10-CM | POA: Diagnosis not present

## 2019-03-09 DIAGNOSIS — Z7984 Long term (current) use of oral hypoglycemic drugs: Secondary | ICD-10-CM | POA: Diagnosis not present

## 2019-03-09 DIAGNOSIS — J449 Chronic obstructive pulmonary disease, unspecified: Secondary | ICD-10-CM | POA: Diagnosis not present

## 2019-03-09 DIAGNOSIS — E785 Hyperlipidemia, unspecified: Secondary | ICD-10-CM | POA: Diagnosis not present

## 2019-03-09 DIAGNOSIS — E1140 Type 2 diabetes mellitus with diabetic neuropathy, unspecified: Secondary | ICD-10-CM | POA: Diagnosis not present

## 2019-03-09 DIAGNOSIS — Z7901 Long term (current) use of anticoagulants: Secondary | ICD-10-CM | POA: Diagnosis not present

## 2019-03-09 DIAGNOSIS — I471 Supraventricular tachycardia: Secondary | ICD-10-CM | POA: Diagnosis not present

## 2019-03-09 DIAGNOSIS — M898X9 Other specified disorders of bone, unspecified site: Secondary | ICD-10-CM | POA: Diagnosis not present

## 2019-03-09 DIAGNOSIS — I951 Orthostatic hypotension: Secondary | ICD-10-CM | POA: Diagnosis not present

## 2019-03-09 DIAGNOSIS — Z882 Allergy status to sulfonamides status: Secondary | ICD-10-CM | POA: Diagnosis not present

## 2019-03-09 DIAGNOSIS — T451X5D Adverse effect of antineoplastic and immunosuppressive drugs, subsequent encounter: Secondary | ICD-10-CM | POA: Diagnosis not present

## 2019-03-09 DIAGNOSIS — M810 Age-related osteoporosis without current pathological fracture: Secondary | ICD-10-CM | POA: Diagnosis not present

## 2019-03-09 DIAGNOSIS — Z9049 Acquired absence of other specified parts of digestive tract: Secondary | ICD-10-CM | POA: Diagnosis not present

## 2019-03-09 DIAGNOSIS — I10 Essential (primary) hypertension: Secondary | ICD-10-CM | POA: Diagnosis not present

## 2019-03-09 DIAGNOSIS — Z9221 Personal history of antineoplastic chemotherapy: Secondary | ICD-10-CM | POA: Diagnosis not present

## 2019-03-09 DIAGNOSIS — C50912 Malignant neoplasm of unspecified site of left female breast: Secondary | ICD-10-CM | POA: Diagnosis not present

## 2019-03-09 DIAGNOSIS — C50412 Malignant neoplasm of upper-outer quadrant of left female breast: Secondary | ICD-10-CM | POA: Diagnosis not present

## 2019-03-10 ENCOUNTER — Other Ambulatory Visit: Payer: Self-pay | Admitting: Oncology

## 2019-03-10 ENCOUNTER — Ambulatory Visit (HOSPITAL_COMMUNITY)
Admission: RE | Admit: 2019-03-10 | Discharge: 2019-03-10 | Disposition: A | Discharge status: Home / Self Care | Payer: Medicare Other | Source: Ambulatory Visit | Attending: Oncology | Admitting: Oncology

## 2019-03-10 ENCOUNTER — Other Ambulatory Visit (HOSPITAL_COMMUNITY): Payer: Self-pay | Admitting: Oncology

## 2019-03-10 ENCOUNTER — Other Ambulatory Visit: Payer: Self-pay

## 2019-03-10 ENCOUNTER — Ambulatory Visit: Payer: Medicare Other | Admitting: Orthopaedic Surgery

## 2019-03-10 DIAGNOSIS — M79661 Pain in right lower leg: Secondary | ICD-10-CM

## 2019-03-10 DIAGNOSIS — Z95828 Presence of other vascular implants and grafts: Secondary | ICD-10-CM | POA: Diagnosis not present

## 2019-03-10 DIAGNOSIS — C50412 Malignant neoplasm of upper-outer quadrant of left female breast: Secondary | ICD-10-CM | POA: Diagnosis not present

## 2019-03-10 DIAGNOSIS — Z171 Estrogen receptor negative status [ER-]: Secondary | ICD-10-CM | POA: Diagnosis not present

## 2019-03-10 DIAGNOSIS — I82431 Acute embolism and thrombosis of right popliteal vein: Secondary | ICD-10-CM | POA: Diagnosis not present

## 2019-03-10 DIAGNOSIS — I82411 Acute embolism and thrombosis of right femoral vein: Secondary | ICD-10-CM | POA: Diagnosis not present

## 2019-03-11 DIAGNOSIS — Z171 Estrogen receptor negative status [ER-]: Secondary | ICD-10-CM | POA: Diagnosis not present

## 2019-03-11 DIAGNOSIS — C50412 Malignant neoplasm of upper-outer quadrant of left female breast: Secondary | ICD-10-CM | POA: Diagnosis not present

## 2019-03-12 DIAGNOSIS — C50412 Malignant neoplasm of upper-outer quadrant of left female breast: Secondary | ICD-10-CM | POA: Diagnosis not present

## 2019-03-12 DIAGNOSIS — C7989 Secondary malignant neoplasm of other specified sites: Secondary | ICD-10-CM | POA: Diagnosis not present

## 2019-03-12 DIAGNOSIS — C50912 Malignant neoplasm of unspecified site of left female breast: Secondary | ICD-10-CM | POA: Diagnosis not present

## 2019-03-12 DIAGNOSIS — Z171 Estrogen receptor negative status [ER-]: Secondary | ICD-10-CM | POA: Diagnosis not present

## 2019-03-15 DIAGNOSIS — C50412 Malignant neoplasm of upper-outer quadrant of left female breast: Secondary | ICD-10-CM | POA: Diagnosis not present

## 2019-03-15 DIAGNOSIS — Z171 Estrogen receptor negative status [ER-]: Secondary | ICD-10-CM | POA: Diagnosis not present

## 2019-03-16 DIAGNOSIS — Z171 Estrogen receptor negative status [ER-]: Secondary | ICD-10-CM | POA: Diagnosis not present

## 2019-03-16 DIAGNOSIS — C50412 Malignant neoplasm of upper-outer quadrant of left female breast: Secondary | ICD-10-CM | POA: Diagnosis not present

## 2019-03-18 DIAGNOSIS — T50905S Adverse effect of unspecified drugs, medicaments and biological substances, sequela: Secondary | ICD-10-CM | POA: Diagnosis not present

## 2019-03-18 DIAGNOSIS — C7989 Secondary malignant neoplasm of other specified sites: Secondary | ICD-10-CM | POA: Diagnosis not present

## 2019-03-18 DIAGNOSIS — Z171 Estrogen receptor negative status [ER-]: Secondary | ICD-10-CM | POA: Diagnosis not present

## 2019-03-18 DIAGNOSIS — C50412 Malignant neoplasm of upper-outer quadrant of left female breast: Secondary | ICD-10-CM | POA: Diagnosis not present

## 2019-03-18 DIAGNOSIS — K521 Toxic gastroenteritis and colitis: Secondary | ICD-10-CM | POA: Diagnosis not present

## 2019-03-18 DIAGNOSIS — Z95828 Presence of other vascular implants and grafts: Secondary | ICD-10-CM | POA: Diagnosis not present

## 2019-03-18 DIAGNOSIS — C50912 Malignant neoplasm of unspecified site of left female breast: Secondary | ICD-10-CM | POA: Diagnosis not present

## 2019-03-18 DIAGNOSIS — T451X5D Adverse effect of antineoplastic and immunosuppressive drugs, subsequent encounter: Secondary | ICD-10-CM | POA: Diagnosis not present

## 2019-03-19 DIAGNOSIS — C50412 Malignant neoplasm of upper-outer quadrant of left female breast: Secondary | ICD-10-CM | POA: Diagnosis not present

## 2019-03-19 DIAGNOSIS — Z171 Estrogen receptor negative status [ER-]: Secondary | ICD-10-CM | POA: Diagnosis not present

## 2019-03-19 DIAGNOSIS — K521 Toxic gastroenteritis and colitis: Secondary | ICD-10-CM | POA: Diagnosis not present

## 2019-03-24 DIAGNOSIS — T50995D Adverse effect of other drugs, medicaments and biological substances, subsequent encounter: Secondary | ICD-10-CM | POA: Diagnosis not present

## 2019-03-24 DIAGNOSIS — K521 Toxic gastroenteritis and colitis: Secondary | ICD-10-CM | POA: Diagnosis not present

## 2019-03-24 DIAGNOSIS — T50905S Adverse effect of unspecified drugs, medicaments and biological substances, sequela: Secondary | ICD-10-CM | POA: Diagnosis not present

## 2019-03-24 DIAGNOSIS — T451X5D Adverse effect of antineoplastic and immunosuppressive drugs, subsequent encounter: Secondary | ICD-10-CM | POA: Diagnosis not present

## 2019-03-24 DIAGNOSIS — C50912 Malignant neoplasm of unspecified site of left female breast: Secondary | ICD-10-CM | POA: Diagnosis not present

## 2019-03-24 DIAGNOSIS — C7989 Secondary malignant neoplasm of other specified sites: Secondary | ICD-10-CM | POA: Diagnosis not present

## 2019-03-26 DIAGNOSIS — K521 Toxic gastroenteritis and colitis: Secondary | ICD-10-CM | POA: Diagnosis not present

## 2019-03-26 DIAGNOSIS — T50995D Adverse effect of other drugs, medicaments and biological substances, subsequent encounter: Secondary | ICD-10-CM | POA: Diagnosis not present

## 2019-04-01 DIAGNOSIS — T451X5D Adverse effect of antineoplastic and immunosuppressive drugs, subsequent encounter: Secondary | ICD-10-CM | POA: Diagnosis not present

## 2019-04-01 DIAGNOSIS — C50412 Malignant neoplasm of upper-outer quadrant of left female breast: Secondary | ICD-10-CM | POA: Diagnosis not present

## 2019-04-01 DIAGNOSIS — K521 Toxic gastroenteritis and colitis: Secondary | ICD-10-CM | POA: Diagnosis not present

## 2019-04-01 DIAGNOSIS — Z171 Estrogen receptor negative status [ER-]: Secondary | ICD-10-CM | POA: Diagnosis not present

## 2019-04-01 DIAGNOSIS — C7989 Secondary malignant neoplasm of other specified sites: Secondary | ICD-10-CM | POA: Diagnosis not present

## 2019-04-01 DIAGNOSIS — C50912 Malignant neoplasm of unspecified site of left female breast: Secondary | ICD-10-CM | POA: Diagnosis not present

## 2019-04-02 DIAGNOSIS — Z0000 Encounter for general adult medical examination without abnormal findings: Secondary | ICD-10-CM | POA: Diagnosis not present

## 2019-04-08 ENCOUNTER — Other Ambulatory Visit: Payer: Self-pay | Admitting: Licensed Clinical Social Worker

## 2019-04-15 DIAGNOSIS — C7989 Secondary malignant neoplasm of other specified sites: Secondary | ICD-10-CM | POA: Diagnosis not present

## 2019-04-15 DIAGNOSIS — E119 Type 2 diabetes mellitus without complications: Secondary | ICD-10-CM | POA: Diagnosis not present

## 2019-04-15 DIAGNOSIS — C50412 Malignant neoplasm of upper-outer quadrant of left female breast: Secondary | ICD-10-CM | POA: Diagnosis not present

## 2019-04-15 DIAGNOSIS — N179 Acute kidney failure, unspecified: Secondary | ICD-10-CM | POA: Diagnosis not present

## 2019-04-15 DIAGNOSIS — C50912 Malignant neoplasm of unspecified site of left female breast: Secondary | ICD-10-CM | POA: Diagnosis not present

## 2019-04-15 DIAGNOSIS — R197 Diarrhea, unspecified: Secondary | ICD-10-CM | POA: Diagnosis not present

## 2019-04-23 DIAGNOSIS — K521 Toxic gastroenteritis and colitis: Secondary | ICD-10-CM | POA: Diagnosis not present

## 2019-04-23 DIAGNOSIS — T50995D Adverse effect of other drugs, medicaments and biological substances, subsequent encounter: Secondary | ICD-10-CM | POA: Diagnosis not present

## 2019-05-04 DIAGNOSIS — C7989 Secondary malignant neoplasm of other specified sites: Secondary | ICD-10-CM | POA: Diagnosis not present

## 2019-05-04 DIAGNOSIS — C50912 Malignant neoplasm of unspecified site of left female breast: Secondary | ICD-10-CM | POA: Diagnosis not present

## 2019-05-04 DIAGNOSIS — T50905S Adverse effect of unspecified drugs, medicaments and biological substances, sequela: Secondary | ICD-10-CM | POA: Diagnosis not present

## 2019-05-06 DIAGNOSIS — Z5111 Encounter for antineoplastic chemotherapy: Secondary | ICD-10-CM | POA: Diagnosis not present

## 2019-05-06 DIAGNOSIS — C50412 Malignant neoplasm of upper-outer quadrant of left female breast: Secondary | ICD-10-CM | POA: Diagnosis not present

## 2019-05-06 DIAGNOSIS — Z0189 Encounter for other specified special examinations: Secondary | ICD-10-CM | POA: Diagnosis not present

## 2019-05-06 DIAGNOSIS — Z95828 Presence of other vascular implants and grafts: Secondary | ICD-10-CM | POA: Diagnosis not present

## 2019-05-06 DIAGNOSIS — Z171 Estrogen receptor negative status [ER-]: Secondary | ICD-10-CM | POA: Diagnosis not present

## 2019-05-06 DIAGNOSIS — I517 Cardiomegaly: Secondary | ICD-10-CM | POA: Diagnosis not present

## 2019-05-06 DIAGNOSIS — Z5112 Encounter for antineoplastic immunotherapy: Secondary | ICD-10-CM | POA: Diagnosis not present

## 2019-05-06 DIAGNOSIS — C7989 Secondary malignant neoplasm of other specified sites: Secondary | ICD-10-CM | POA: Diagnosis not present

## 2019-05-06 DIAGNOSIS — I700 Atherosclerosis of aorta: Secondary | ICD-10-CM | POA: Diagnosis not present

## 2019-05-06 DIAGNOSIS — C50912 Malignant neoplasm of unspecified site of left female breast: Secondary | ICD-10-CM | POA: Diagnosis not present

## 2019-05-10 DIAGNOSIS — C7989 Secondary malignant neoplasm of other specified sites: Secondary | ICD-10-CM | POA: Diagnosis not present

## 2019-05-10 DIAGNOSIS — C50912 Malignant neoplasm of unspecified site of left female breast: Secondary | ICD-10-CM | POA: Diagnosis not present

## 2019-05-10 DIAGNOSIS — C50412 Malignant neoplasm of upper-outer quadrant of left female breast: Secondary | ICD-10-CM | POA: Diagnosis not present

## 2019-05-10 DIAGNOSIS — G8918 Other acute postprocedural pain: Secondary | ICD-10-CM | POA: Diagnosis not present

## 2019-05-12 DIAGNOSIS — C50412 Malignant neoplasm of upper-outer quadrant of left female breast: Secondary | ICD-10-CM | POA: Diagnosis not present

## 2019-05-12 DIAGNOSIS — Z95828 Presence of other vascular implants and grafts: Secondary | ICD-10-CM | POA: Diagnosis not present

## 2019-05-12 DIAGNOSIS — Z171 Estrogen receptor negative status [ER-]: Secondary | ICD-10-CM | POA: Diagnosis not present

## 2019-05-12 DIAGNOSIS — Z5111 Encounter for antineoplastic chemotherapy: Secondary | ICD-10-CM | POA: Diagnosis not present

## 2019-05-18 DIAGNOSIS — C7989 Secondary malignant neoplasm of other specified sites: Secondary | ICD-10-CM | POA: Diagnosis not present

## 2019-05-18 DIAGNOSIS — R222 Localized swelling, mass and lump, trunk: Secondary | ICD-10-CM | POA: Diagnosis not present

## 2019-05-18 DIAGNOSIS — T50995D Adverse effect of other drugs, medicaments and biological substances, subsequent encounter: Secondary | ICD-10-CM | POA: Diagnosis not present

## 2019-05-18 DIAGNOSIS — Z09 Encounter for follow-up examination after completed treatment for conditions other than malignant neoplasm: Secondary | ICD-10-CM | POA: Diagnosis not present

## 2019-05-18 DIAGNOSIS — C50912 Malignant neoplasm of unspecified site of left female breast: Secondary | ICD-10-CM | POA: Diagnosis not present

## 2019-05-19 DIAGNOSIS — C7989 Secondary malignant neoplasm of other specified sites: Secondary | ICD-10-CM | POA: Diagnosis not present

## 2019-05-19 DIAGNOSIS — C50912 Malignant neoplasm of unspecified site of left female breast: Secondary | ICD-10-CM | POA: Diagnosis not present

## 2019-05-19 DIAGNOSIS — D0512 Intraductal carcinoma in situ of left breast: Secondary | ICD-10-CM | POA: Diagnosis not present

## 2019-05-19 DIAGNOSIS — C50412 Malignant neoplasm of upper-outer quadrant of left female breast: Secondary | ICD-10-CM | POA: Diagnosis not present

## 2019-05-20 DIAGNOSIS — Z79899 Other long term (current) drug therapy: Secondary | ICD-10-CM | POA: Diagnosis not present

## 2019-05-20 DIAGNOSIS — R197 Diarrhea, unspecified: Secondary | ICD-10-CM | POA: Diagnosis not present

## 2019-05-20 DIAGNOSIS — Z171 Estrogen receptor negative status [ER-]: Secondary | ICD-10-CM | POA: Diagnosis not present

## 2019-05-20 DIAGNOSIS — C50412 Malignant neoplasm of upper-outer quadrant of left female breast: Secondary | ICD-10-CM | POA: Diagnosis not present

## 2019-05-21 DIAGNOSIS — K521 Toxic gastroenteritis and colitis: Secondary | ICD-10-CM | POA: Diagnosis not present

## 2019-05-21 DIAGNOSIS — T50905A Adverse effect of unspecified drugs, medicaments and biological substances, initial encounter: Secondary | ICD-10-CM | POA: Diagnosis not present

## 2019-05-21 DIAGNOSIS — Z171 Estrogen receptor negative status [ER-]: Secondary | ICD-10-CM | POA: Diagnosis not present

## 2019-05-21 DIAGNOSIS — Z95828 Presence of other vascular implants and grafts: Secondary | ICD-10-CM | POA: Diagnosis not present

## 2019-05-21 DIAGNOSIS — C50412 Malignant neoplasm of upper-outer quadrant of left female breast: Secondary | ICD-10-CM | POA: Diagnosis not present

## 2019-05-25 DIAGNOSIS — Z09 Encounter for follow-up examination after completed treatment for conditions other than malignant neoplasm: Secondary | ICD-10-CM | POA: Diagnosis not present

## 2019-05-25 DIAGNOSIS — C7989 Secondary malignant neoplasm of other specified sites: Secondary | ICD-10-CM | POA: Diagnosis not present

## 2019-05-25 DIAGNOSIS — C50412 Malignant neoplasm of upper-outer quadrant of left female breast: Secondary | ICD-10-CM | POA: Diagnosis not present

## 2019-05-25 DIAGNOSIS — K521 Toxic gastroenteritis and colitis: Secondary | ICD-10-CM | POA: Diagnosis not present

## 2019-05-25 DIAGNOSIS — Z171 Estrogen receptor negative status [ER-]: Secondary | ICD-10-CM | POA: Diagnosis not present

## 2019-05-25 DIAGNOSIS — C50912 Malignant neoplasm of unspecified site of left female breast: Secondary | ICD-10-CM | POA: Diagnosis not present

## 2019-05-25 DIAGNOSIS — Z95828 Presence of other vascular implants and grafts: Secondary | ICD-10-CM | POA: Diagnosis not present

## 2019-05-26 DIAGNOSIS — Z5112 Encounter for antineoplastic immunotherapy: Secondary | ICD-10-CM | POA: Diagnosis not present

## 2019-05-26 DIAGNOSIS — C50412 Malignant neoplasm of upper-outer quadrant of left female breast: Secondary | ICD-10-CM | POA: Diagnosis not present

## 2019-05-26 DIAGNOSIS — Z171 Estrogen receptor negative status [ER-]: Secondary | ICD-10-CM | POA: Diagnosis not present

## 2019-05-27 DIAGNOSIS — K521 Toxic gastroenteritis and colitis: Secondary | ICD-10-CM | POA: Diagnosis not present

## 2019-05-31 DIAGNOSIS — I471 Supraventricular tachycardia: Secondary | ICD-10-CM | POA: Diagnosis not present

## 2019-05-31 DIAGNOSIS — C7989 Secondary malignant neoplasm of other specified sites: Secondary | ICD-10-CM | POA: Diagnosis not present

## 2019-05-31 DIAGNOSIS — C50912 Malignant neoplasm of unspecified site of left female breast: Secondary | ICD-10-CM | POA: Diagnosis not present

## 2019-06-01 DIAGNOSIS — T50995D Adverse effect of other drugs, medicaments and biological substances, subsequent encounter: Secondary | ICD-10-CM | POA: Diagnosis not present

## 2019-06-01 DIAGNOSIS — C50912 Malignant neoplasm of unspecified site of left female breast: Secondary | ICD-10-CM | POA: Diagnosis not present

## 2019-06-01 DIAGNOSIS — C50412 Malignant neoplasm of upper-outer quadrant of left female breast: Secondary | ICD-10-CM | POA: Diagnosis not present

## 2019-06-01 DIAGNOSIS — K521 Toxic gastroenteritis and colitis: Secondary | ICD-10-CM | POA: Diagnosis not present

## 2019-06-01 DIAGNOSIS — C7989 Secondary malignant neoplasm of other specified sites: Secondary | ICD-10-CM | POA: Diagnosis not present

## 2019-06-01 DIAGNOSIS — Z09 Encounter for follow-up examination after completed treatment for conditions other than malignant neoplasm: Secondary | ICD-10-CM | POA: Diagnosis not present

## 2019-06-08 DIAGNOSIS — I82401 Acute embolism and thrombosis of unspecified deep veins of right lower extremity: Secondary | ICD-10-CM | POA: Diagnosis not present

## 2019-06-08 DIAGNOSIS — E139 Other specified diabetes mellitus without complications: Secondary | ICD-10-CM | POA: Diagnosis not present

## 2019-06-08 DIAGNOSIS — R1013 Epigastric pain: Secondary | ICD-10-CM | POA: Diagnosis not present

## 2019-06-08 DIAGNOSIS — C7989 Secondary malignant neoplasm of other specified sites: Secondary | ICD-10-CM | POA: Diagnosis not present

## 2019-06-08 DIAGNOSIS — T50995D Adverse effect of other drugs, medicaments and biological substances, subsequent encounter: Secondary | ICD-10-CM | POA: Diagnosis not present

## 2019-06-08 DIAGNOSIS — C50912 Malignant neoplasm of unspecified site of left female breast: Secondary | ICD-10-CM | POA: Diagnosis not present

## 2019-06-08 DIAGNOSIS — R112 Nausea with vomiting, unspecified: Secondary | ICD-10-CM | POA: Diagnosis not present

## 2019-06-08 DIAGNOSIS — Z09 Encounter for follow-up examination after completed treatment for conditions other than malignant neoplasm: Secondary | ICD-10-CM | POA: Diagnosis not present

## 2019-06-08 DIAGNOSIS — C50412 Malignant neoplasm of upper-outer quadrant of left female breast: Secondary | ICD-10-CM | POA: Diagnosis not present

## 2019-06-08 DIAGNOSIS — R197 Diarrhea, unspecified: Secondary | ICD-10-CM | POA: Diagnosis not present

## 2019-06-09 DIAGNOSIS — K521 Toxic gastroenteritis and colitis: Secondary | ICD-10-CM | POA: Diagnosis not present

## 2019-06-10 DIAGNOSIS — C50912 Malignant neoplasm of unspecified site of left female breast: Secondary | ICD-10-CM | POA: Diagnosis not present

## 2019-06-10 DIAGNOSIS — C7989 Secondary malignant neoplasm of other specified sites: Secondary | ICD-10-CM | POA: Diagnosis not present

## 2019-06-10 DIAGNOSIS — T50995D Adverse effect of other drugs, medicaments and biological substances, subsequent encounter: Secondary | ICD-10-CM | POA: Diagnosis not present

## 2019-06-10 DIAGNOSIS — C50412 Malignant neoplasm of upper-outer quadrant of left female breast: Secondary | ICD-10-CM | POA: Diagnosis not present

## 2019-06-11 DIAGNOSIS — Z171 Estrogen receptor negative status [ER-]: Secondary | ICD-10-CM | POA: Diagnosis not present

## 2019-06-11 DIAGNOSIS — C50912 Malignant neoplasm of unspecified site of left female breast: Secondary | ICD-10-CM | POA: Diagnosis not present

## 2019-06-11 DIAGNOSIS — K521 Toxic gastroenteritis and colitis: Secondary | ICD-10-CM | POA: Diagnosis not present

## 2019-06-11 DIAGNOSIS — C50412 Malignant neoplasm of upper-outer quadrant of left female breast: Secondary | ICD-10-CM | POA: Diagnosis not present

## 2019-06-11 DIAGNOSIS — Z95828 Presence of other vascular implants and grafts: Secondary | ICD-10-CM | POA: Diagnosis not present

## 2019-06-14 DIAGNOSIS — I129 Hypertensive chronic kidney disease with stage 1 through stage 4 chronic kidney disease, or unspecified chronic kidney disease: Secondary | ICD-10-CM | POA: Diagnosis not present

## 2019-06-14 DIAGNOSIS — E782 Mixed hyperlipidemia: Secondary | ICD-10-CM | POA: Diagnosis not present

## 2019-06-14 DIAGNOSIS — E1122 Type 2 diabetes mellitus with diabetic chronic kidney disease: Secondary | ICD-10-CM | POA: Diagnosis not present

## 2019-06-14 DIAGNOSIS — N183 Chronic kidney disease, stage 3 (moderate): Secondary | ICD-10-CM | POA: Diagnosis not present

## 2019-06-14 DIAGNOSIS — I10 Essential (primary) hypertension: Secondary | ICD-10-CM | POA: Diagnosis not present

## 2019-06-15 DIAGNOSIS — Z09 Encounter for follow-up examination after completed treatment for conditions other than malignant neoplasm: Secondary | ICD-10-CM | POA: Diagnosis not present

## 2019-06-15 DIAGNOSIS — T50995D Adverse effect of other drugs, medicaments and biological substances, subsequent encounter: Secondary | ICD-10-CM | POA: Diagnosis not present

## 2019-06-15 DIAGNOSIS — C7989 Secondary malignant neoplasm of other specified sites: Secondary | ICD-10-CM | POA: Diagnosis not present

## 2019-06-15 DIAGNOSIS — I10 Essential (primary) hypertension: Secondary | ICD-10-CM | POA: Diagnosis not present

## 2019-06-15 DIAGNOSIS — E7800 Pure hypercholesterolemia, unspecified: Secondary | ICD-10-CM | POA: Diagnosis not present

## 2019-06-15 DIAGNOSIS — C50912 Malignant neoplasm of unspecified site of left female breast: Secondary | ICD-10-CM | POA: Diagnosis not present

## 2019-06-15 DIAGNOSIS — R109 Unspecified abdominal pain: Secondary | ICD-10-CM | POA: Diagnosis not present

## 2019-06-15 DIAGNOSIS — R1013 Epigastric pain: Secondary | ICD-10-CM | POA: Diagnosis not present

## 2019-06-15 DIAGNOSIS — T50905S Adverse effect of unspecified drugs, medicaments and biological substances, sequela: Secondary | ICD-10-CM | POA: Diagnosis not present

## 2019-06-15 DIAGNOSIS — R1084 Generalized abdominal pain: Secondary | ICD-10-CM | POA: Diagnosis not present

## 2019-06-16 DIAGNOSIS — Z171 Estrogen receptor negative status [ER-]: Secondary | ICD-10-CM | POA: Diagnosis not present

## 2019-06-16 DIAGNOSIS — Z95828 Presence of other vascular implants and grafts: Secondary | ICD-10-CM | POA: Diagnosis not present

## 2019-06-16 DIAGNOSIS — Z5111 Encounter for antineoplastic chemotherapy: Secondary | ICD-10-CM | POA: Diagnosis not present

## 2019-06-16 DIAGNOSIS — C50412 Malignant neoplasm of upper-outer quadrant of left female breast: Secondary | ICD-10-CM | POA: Diagnosis not present

## 2019-06-22 DIAGNOSIS — C7989 Secondary malignant neoplasm of other specified sites: Secondary | ICD-10-CM | POA: Diagnosis not present

## 2019-06-22 DIAGNOSIS — C50412 Malignant neoplasm of upper-outer quadrant of left female breast: Secondary | ICD-10-CM | POA: Diagnosis not present

## 2019-06-22 DIAGNOSIS — C50912 Malignant neoplasm of unspecified site of left female breast: Secondary | ICD-10-CM | POA: Diagnosis not present

## 2019-06-29 DIAGNOSIS — I471 Supraventricular tachycardia: Secondary | ICD-10-CM | POA: Diagnosis not present

## 2019-06-29 DIAGNOSIS — E1165 Type 2 diabetes mellitus with hyperglycemia: Secondary | ICD-10-CM | POA: Diagnosis not present

## 2019-06-29 DIAGNOSIS — E782 Mixed hyperlipidemia: Secondary | ICD-10-CM | POA: Diagnosis not present

## 2019-06-29 DIAGNOSIS — C7989 Secondary malignant neoplasm of other specified sites: Secondary | ICD-10-CM | POA: Diagnosis not present

## 2019-06-29 DIAGNOSIS — C50912 Malignant neoplasm of unspecified site of left female breast: Secondary | ICD-10-CM | POA: Diagnosis not present

## 2019-06-29 DIAGNOSIS — I10 Essential (primary) hypertension: Secondary | ICD-10-CM | POA: Diagnosis not present

## 2019-06-29 DIAGNOSIS — K219 Gastro-esophageal reflux disease without esophagitis: Secondary | ICD-10-CM | POA: Diagnosis not present

## 2019-07-02 DIAGNOSIS — K521 Toxic gastroenteritis and colitis: Secondary | ICD-10-CM | POA: Diagnosis not present

## 2019-07-05 DIAGNOSIS — K219 Gastro-esophageal reflux disease without esophagitis: Secondary | ICD-10-CM | POA: Diagnosis not present

## 2019-07-05 DIAGNOSIS — T451X5A Adverse effect of antineoplastic and immunosuppressive drugs, initial encounter: Secondary | ICD-10-CM | POA: Diagnosis not present

## 2019-07-05 DIAGNOSIS — C7989 Secondary malignant neoplasm of other specified sites: Secondary | ICD-10-CM | POA: Diagnosis not present

## 2019-07-05 DIAGNOSIS — Z9012 Acquired absence of left breast and nipple: Secondary | ICD-10-CM | POA: Diagnosis not present

## 2019-07-05 DIAGNOSIS — Z9049 Acquired absence of other specified parts of digestive tract: Secondary | ICD-10-CM | POA: Diagnosis not present

## 2019-07-05 DIAGNOSIS — C50912 Malignant neoplasm of unspecified site of left female breast: Secondary | ICD-10-CM | POA: Diagnosis not present

## 2019-07-05 DIAGNOSIS — R140 Abdominal distension (gaseous): Secondary | ICD-10-CM | POA: Diagnosis not present

## 2019-07-05 DIAGNOSIS — M797 Fibromyalgia: Secondary | ICD-10-CM | POA: Diagnosis not present

## 2019-07-05 DIAGNOSIS — N179 Acute kidney failure, unspecified: Secondary | ICD-10-CM | POA: Diagnosis not present

## 2019-07-05 DIAGNOSIS — I471 Supraventricular tachycardia: Secondary | ICD-10-CM | POA: Diagnosis not present

## 2019-07-05 DIAGNOSIS — J449 Chronic obstructive pulmonary disease, unspecified: Secondary | ICD-10-CM | POA: Diagnosis not present

## 2019-07-05 DIAGNOSIS — M47819 Spondylosis without myelopathy or radiculopathy, site unspecified: Secondary | ICD-10-CM | POA: Diagnosis not present

## 2019-07-05 DIAGNOSIS — Z882 Allergy status to sulfonamides status: Secondary | ICD-10-CM | POA: Diagnosis not present

## 2019-07-05 DIAGNOSIS — M810 Age-related osteoporosis without current pathological fracture: Secondary | ICD-10-CM | POA: Diagnosis not present

## 2019-07-05 DIAGNOSIS — E1140 Type 2 diabetes mellitus with diabetic neuropathy, unspecified: Secondary | ICD-10-CM | POA: Diagnosis not present

## 2019-07-05 DIAGNOSIS — Z95828 Presence of other vascular implants and grafts: Secondary | ICD-10-CM | POA: Diagnosis not present

## 2019-07-05 DIAGNOSIS — K521 Toxic gastroenteritis and colitis: Secondary | ICD-10-CM | POA: Diagnosis not present

## 2019-07-05 DIAGNOSIS — Z09 Encounter for follow-up examination after completed treatment for conditions other than malignant neoplasm: Secondary | ICD-10-CM | POA: Diagnosis not present

## 2019-07-05 DIAGNOSIS — R1013 Epigastric pain: Secondary | ICD-10-CM | POA: Diagnosis not present

## 2019-07-05 DIAGNOSIS — Z86718 Personal history of other venous thrombosis and embolism: Secondary | ICD-10-CM | POA: Diagnosis not present

## 2019-07-05 DIAGNOSIS — T50995D Adverse effect of other drugs, medicaments and biological substances, subsequent encounter: Secondary | ICD-10-CM | POA: Diagnosis not present

## 2019-07-05 DIAGNOSIS — I10 Essential (primary) hypertension: Secondary | ICD-10-CM | POA: Diagnosis not present

## 2019-07-05 DIAGNOSIS — E785 Hyperlipidemia, unspecified: Secondary | ICD-10-CM | POA: Diagnosis not present

## 2019-07-05 DIAGNOSIS — C50919 Malignant neoplasm of unspecified site of unspecified female breast: Secondary | ICD-10-CM | POA: Diagnosis not present

## 2019-07-05 DIAGNOSIS — Z7984 Long term (current) use of oral hypoglycemic drugs: Secondary | ICD-10-CM | POA: Diagnosis not present

## 2019-07-07 DIAGNOSIS — Z171 Estrogen receptor negative status [ER-]: Secondary | ICD-10-CM | POA: Diagnosis not present

## 2019-07-07 DIAGNOSIS — Z5112 Encounter for antineoplastic immunotherapy: Secondary | ICD-10-CM | POA: Diagnosis not present

## 2019-07-07 DIAGNOSIS — C50412 Malignant neoplasm of upper-outer quadrant of left female breast: Secondary | ICD-10-CM | POA: Diagnosis not present

## 2019-07-07 DIAGNOSIS — Z5111 Encounter for antineoplastic chemotherapy: Secondary | ICD-10-CM | POA: Diagnosis not present

## 2019-07-08 DIAGNOSIS — K521 Toxic gastroenteritis and colitis: Secondary | ICD-10-CM | POA: Diagnosis not present

## 2019-07-08 DIAGNOSIS — Z95828 Presence of other vascular implants and grafts: Secondary | ICD-10-CM | POA: Diagnosis not present

## 2019-07-13 DIAGNOSIS — Z171 Estrogen receptor negative status [ER-]: Secondary | ICD-10-CM | POA: Diagnosis not present

## 2019-07-13 DIAGNOSIS — C50412 Malignant neoplasm of upper-outer quadrant of left female breast: Secondary | ICD-10-CM | POA: Diagnosis not present

## 2019-07-15 DIAGNOSIS — Z171 Estrogen receptor negative status [ER-]: Secondary | ICD-10-CM | POA: Diagnosis not present

## 2019-07-15 DIAGNOSIS — Z95828 Presence of other vascular implants and grafts: Secondary | ICD-10-CM | POA: Diagnosis not present

## 2019-07-15 DIAGNOSIS — C50412 Malignant neoplasm of upper-outer quadrant of left female breast: Secondary | ICD-10-CM | POA: Diagnosis not present

## 2019-07-23 DIAGNOSIS — Z09 Encounter for follow-up examination after completed treatment for conditions other than malignant neoplasm: Secondary | ICD-10-CM | POA: Diagnosis not present

## 2019-07-23 DIAGNOSIS — R109 Unspecified abdominal pain: Secondary | ICD-10-CM | POA: Diagnosis not present

## 2019-07-23 DIAGNOSIS — K521 Toxic gastroenteritis and colitis: Secondary | ICD-10-CM | POA: Diagnosis not present

## 2019-07-23 DIAGNOSIS — T50905S Adverse effect of unspecified drugs, medicaments and biological substances, sequela: Secondary | ICD-10-CM | POA: Diagnosis not present

## 2019-07-23 DIAGNOSIS — C50912 Malignant neoplasm of unspecified site of left female breast: Secondary | ICD-10-CM | POA: Diagnosis not present

## 2019-07-23 DIAGNOSIS — T50995D Adverse effect of other drugs, medicaments and biological substances, subsequent encounter: Secondary | ICD-10-CM | POA: Diagnosis not present

## 2019-07-23 DIAGNOSIS — C7989 Secondary malignant neoplasm of other specified sites: Secondary | ICD-10-CM | POA: Diagnosis not present

## 2019-07-28 DIAGNOSIS — C50412 Malignant neoplasm of upper-outer quadrant of left female breast: Secondary | ICD-10-CM | POA: Diagnosis not present

## 2019-07-28 DIAGNOSIS — Z5111 Encounter for antineoplastic chemotherapy: Secondary | ICD-10-CM | POA: Diagnosis not present

## 2019-07-28 DIAGNOSIS — Z171 Estrogen receptor negative status [ER-]: Secondary | ICD-10-CM | POA: Diagnosis not present

## 2019-07-29 DIAGNOSIS — K521 Toxic gastroenteritis and colitis: Secondary | ICD-10-CM | POA: Diagnosis not present

## 2019-07-29 DIAGNOSIS — Z95828 Presence of other vascular implants and grafts: Secondary | ICD-10-CM | POA: Diagnosis not present

## 2019-07-29 DIAGNOSIS — C7989 Secondary malignant neoplasm of other specified sites: Secondary | ICD-10-CM | POA: Diagnosis not present

## 2019-07-29 DIAGNOSIS — C50912 Malignant neoplasm of unspecified site of left female breast: Secondary | ICD-10-CM | POA: Diagnosis not present

## 2019-07-29 DIAGNOSIS — Z01810 Encounter for preprocedural cardiovascular examination: Secondary | ICD-10-CM | POA: Diagnosis not present

## 2019-07-29 DIAGNOSIS — C50412 Malignant neoplasm of upper-outer quadrant of left female breast: Secondary | ICD-10-CM | POA: Diagnosis not present

## 2019-07-29 DIAGNOSIS — T50995D Adverse effect of other drugs, medicaments and biological substances, subsequent encounter: Secondary | ICD-10-CM | POA: Diagnosis not present

## 2019-07-29 DIAGNOSIS — Z171 Estrogen receptor negative status [ER-]: Secondary | ICD-10-CM | POA: Diagnosis not present

## 2019-07-30 DIAGNOSIS — C7989 Secondary malignant neoplasm of other specified sites: Secondary | ICD-10-CM | POA: Diagnosis not present

## 2019-07-30 DIAGNOSIS — C50412 Malignant neoplasm of upper-outer quadrant of left female breast: Secondary | ICD-10-CM | POA: Diagnosis not present

## 2019-07-30 DIAGNOSIS — T50995D Adverse effect of other drugs, medicaments and biological substances, subsequent encounter: Secondary | ICD-10-CM | POA: Diagnosis not present

## 2019-07-30 DIAGNOSIS — C50912 Malignant neoplasm of unspecified site of left female breast: Secondary | ICD-10-CM | POA: Diagnosis not present

## 2019-07-30 DIAGNOSIS — Z171 Estrogen receptor negative status [ER-]: Secondary | ICD-10-CM | POA: Diagnosis not present

## 2019-08-04 DIAGNOSIS — Z171 Estrogen receptor negative status [ER-]: Secondary | ICD-10-CM | POA: Diagnosis not present

## 2019-08-04 DIAGNOSIS — C50412 Malignant neoplasm of upper-outer quadrant of left female breast: Secondary | ICD-10-CM | POA: Diagnosis not present

## 2019-08-05 DIAGNOSIS — K219 Gastro-esophageal reflux disease without esophagitis: Secondary | ICD-10-CM | POA: Diagnosis not present

## 2019-08-05 DIAGNOSIS — I471 Supraventricular tachycardia: Secondary | ICD-10-CM | POA: Diagnosis not present

## 2019-08-05 DIAGNOSIS — E782 Mixed hyperlipidemia: Secondary | ICD-10-CM | POA: Diagnosis not present

## 2019-08-05 DIAGNOSIS — R42 Dizziness and giddiness: Secondary | ICD-10-CM | POA: Diagnosis not present

## 2019-08-05 DIAGNOSIS — I10 Essential (primary) hypertension: Secondary | ICD-10-CM | POA: Diagnosis not present

## 2019-08-05 DIAGNOSIS — E1165 Type 2 diabetes mellitus with hyperglycemia: Secondary | ICD-10-CM | POA: Diagnosis not present

## 2019-08-05 DIAGNOSIS — C50912 Malignant neoplasm of unspecified site of left female breast: Secondary | ICD-10-CM | POA: Diagnosis not present

## 2019-08-05 DIAGNOSIS — C7989 Secondary malignant neoplasm of other specified sites: Secondary | ICD-10-CM | POA: Diagnosis not present

## 2019-08-10 DIAGNOSIS — C7989 Secondary malignant neoplasm of other specified sites: Secondary | ICD-10-CM | POA: Diagnosis not present

## 2019-08-10 DIAGNOSIS — C7981 Secondary malignant neoplasm of breast: Secondary | ICD-10-CM | POA: Diagnosis not present

## 2019-08-10 DIAGNOSIS — Z01812 Encounter for preprocedural laboratory examination: Secondary | ICD-10-CM | POA: Diagnosis not present

## 2019-08-10 DIAGNOSIS — Z79899 Other long term (current) drug therapy: Secondary | ICD-10-CM | POA: Diagnosis not present

## 2019-08-10 DIAGNOSIS — C50912 Malignant neoplasm of unspecified site of left female breast: Secondary | ICD-10-CM | POA: Diagnosis not present

## 2019-08-10 DIAGNOSIS — Z9012 Acquired absence of left breast and nipple: Secondary | ICD-10-CM | POA: Diagnosis not present

## 2019-08-10 DIAGNOSIS — Z8711 Personal history of peptic ulcer disease: Secondary | ICD-10-CM | POA: Diagnosis not present

## 2019-08-10 DIAGNOSIS — Z7901 Long term (current) use of anticoagulants: Secondary | ICD-10-CM | POA: Diagnosis not present

## 2019-08-10 DIAGNOSIS — M810 Age-related osteoporosis without current pathological fracture: Secondary | ICD-10-CM | POA: Diagnosis not present

## 2019-08-10 DIAGNOSIS — R42 Dizziness and giddiness: Secondary | ICD-10-CM | POA: Diagnosis not present

## 2019-08-10 DIAGNOSIS — C50412 Malignant neoplasm of upper-outer quadrant of left female breast: Secondary | ICD-10-CM | POA: Diagnosis not present

## 2019-08-10 DIAGNOSIS — E119 Type 2 diabetes mellitus without complications: Secondary | ICD-10-CM | POA: Diagnosis not present

## 2019-08-10 DIAGNOSIS — R531 Weakness: Secondary | ICD-10-CM | POA: Diagnosis not present

## 2019-08-10 DIAGNOSIS — Z7984 Long term (current) use of oral hypoglycemic drugs: Secondary | ICD-10-CM | POA: Diagnosis not present

## 2019-08-10 DIAGNOSIS — J449 Chronic obstructive pulmonary disease, unspecified: Secondary | ICD-10-CM | POA: Diagnosis not present

## 2019-08-10 DIAGNOSIS — E785 Hyperlipidemia, unspecified: Secondary | ICD-10-CM | POA: Diagnosis not present

## 2019-08-10 DIAGNOSIS — Z9221 Personal history of antineoplastic chemotherapy: Secondary | ICD-10-CM | POA: Diagnosis not present

## 2019-08-11 DIAGNOSIS — C50912 Malignant neoplasm of unspecified site of left female breast: Secondary | ICD-10-CM | POA: Diagnosis not present

## 2019-08-11 DIAGNOSIS — Z1231 Encounter for screening mammogram for malignant neoplasm of breast: Secondary | ICD-10-CM | POA: Diagnosis not present

## 2019-08-11 DIAGNOSIS — Z9012 Acquired absence of left breast and nipple: Secondary | ICD-10-CM | POA: Diagnosis not present

## 2019-08-11 DIAGNOSIS — C7989 Secondary malignant neoplasm of other specified sites: Secondary | ICD-10-CM | POA: Diagnosis not present

## 2019-08-11 DIAGNOSIS — R222 Localized swelling, mass and lump, trunk: Secondary | ICD-10-CM | POA: Diagnosis not present

## 2019-08-12 DIAGNOSIS — M810 Age-related osteoporosis without current pathological fracture: Secondary | ICD-10-CM | POA: Diagnosis not present

## 2019-08-12 DIAGNOSIS — Z9012 Acquired absence of left breast and nipple: Secondary | ICD-10-CM | POA: Diagnosis not present

## 2019-08-12 DIAGNOSIS — I10 Essential (primary) hypertension: Secondary | ICD-10-CM | POA: Diagnosis not present

## 2019-08-12 DIAGNOSIS — Z7901 Long term (current) use of anticoagulants: Secondary | ICD-10-CM | POA: Diagnosis not present

## 2019-08-12 DIAGNOSIS — E785 Hyperlipidemia, unspecified: Secondary | ICD-10-CM | POA: Diagnosis not present

## 2019-08-12 DIAGNOSIS — Z7984 Long term (current) use of oral hypoglycemic drugs: Secondary | ICD-10-CM | POA: Diagnosis not present

## 2019-08-12 DIAGNOSIS — J449 Chronic obstructive pulmonary disease, unspecified: Secondary | ICD-10-CM | POA: Diagnosis not present

## 2019-08-12 DIAGNOSIS — C44501 Unspecified malignant neoplasm of skin of breast: Secondary | ICD-10-CM | POA: Diagnosis not present

## 2019-08-12 DIAGNOSIS — C50912 Malignant neoplasm of unspecified site of left female breast: Secondary | ICD-10-CM | POA: Diagnosis not present

## 2019-08-12 DIAGNOSIS — Z79899 Other long term (current) drug therapy: Secondary | ICD-10-CM | POA: Diagnosis not present

## 2019-08-12 DIAGNOSIS — Z9221 Personal history of antineoplastic chemotherapy: Secondary | ICD-10-CM | POA: Diagnosis not present

## 2019-08-12 DIAGNOSIS — E119 Type 2 diabetes mellitus without complications: Secondary | ICD-10-CM | POA: Diagnosis not present

## 2019-08-12 DIAGNOSIS — C50412 Malignant neoplasm of upper-outer quadrant of left female breast: Secondary | ICD-10-CM | POA: Diagnosis not present

## 2019-08-12 DIAGNOSIS — C7981 Secondary malignant neoplasm of breast: Secondary | ICD-10-CM | POA: Diagnosis not present

## 2019-08-12 DIAGNOSIS — Z8711 Personal history of peptic ulcer disease: Secondary | ICD-10-CM | POA: Diagnosis not present

## 2019-08-16 DIAGNOSIS — T50995D Adverse effect of other drugs, medicaments and biological substances, subsequent encounter: Secondary | ICD-10-CM | POA: Diagnosis not present

## 2019-08-16 DIAGNOSIS — Z171 Estrogen receptor negative status [ER-]: Secondary | ICD-10-CM | POA: Diagnosis not present

## 2019-08-16 DIAGNOSIS — Z09 Encounter for follow-up examination after completed treatment for conditions other than malignant neoplasm: Secondary | ICD-10-CM | POA: Diagnosis not present

## 2019-08-16 DIAGNOSIS — G8918 Other acute postprocedural pain: Secondary | ICD-10-CM | POA: Diagnosis not present

## 2019-08-16 DIAGNOSIS — C50412 Malignant neoplasm of upper-outer quadrant of left female breast: Secondary | ICD-10-CM | POA: Diagnosis not present

## 2019-08-16 DIAGNOSIS — T50905S Adverse effect of unspecified drugs, medicaments and biological substances, sequela: Secondary | ICD-10-CM | POA: Diagnosis not present

## 2019-08-16 DIAGNOSIS — C7989 Secondary malignant neoplasm of other specified sites: Secondary | ICD-10-CM | POA: Diagnosis not present

## 2019-08-16 DIAGNOSIS — C50912 Malignant neoplasm of unspecified site of left female breast: Secondary | ICD-10-CM | POA: Diagnosis not present

## 2019-08-18 DIAGNOSIS — Z5112 Encounter for antineoplastic immunotherapy: Secondary | ICD-10-CM | POA: Diagnosis not present

## 2019-08-18 DIAGNOSIS — Z171 Estrogen receptor negative status [ER-]: Secondary | ICD-10-CM | POA: Diagnosis not present

## 2019-08-18 DIAGNOSIS — C50412 Malignant neoplasm of upper-outer quadrant of left female breast: Secondary | ICD-10-CM | POA: Diagnosis not present

## 2019-08-26 DIAGNOSIS — C50412 Malignant neoplasm of upper-outer quadrant of left female breast: Secondary | ICD-10-CM | POA: Diagnosis not present

## 2019-08-26 DIAGNOSIS — Z171 Estrogen receptor negative status [ER-]: Secondary | ICD-10-CM | POA: Diagnosis not present

## 2019-08-26 DIAGNOSIS — C50912 Malignant neoplasm of unspecified site of left female breast: Secondary | ICD-10-CM | POA: Diagnosis not present

## 2019-08-26 DIAGNOSIS — Z09 Encounter for follow-up examination after completed treatment for conditions other than malignant neoplasm: Secondary | ICD-10-CM | POA: Diagnosis not present

## 2019-08-26 DIAGNOSIS — T50905S Adverse effect of unspecified drugs, medicaments and biological substances, sequela: Secondary | ICD-10-CM | POA: Diagnosis not present

## 2019-08-26 DIAGNOSIS — C7989 Secondary malignant neoplasm of other specified sites: Secondary | ICD-10-CM | POA: Diagnosis not present

## 2019-08-30 DIAGNOSIS — Z95828 Presence of other vascular implants and grafts: Secondary | ICD-10-CM | POA: Diagnosis not present

## 2019-08-30 DIAGNOSIS — K521 Toxic gastroenteritis and colitis: Secondary | ICD-10-CM | POA: Diagnosis not present

## 2019-09-06 DIAGNOSIS — I10 Essential (primary) hypertension: Secondary | ICD-10-CM | POA: Diagnosis not present

## 2019-09-06 DIAGNOSIS — C7989 Secondary malignant neoplasm of other specified sites: Secondary | ICD-10-CM | POA: Diagnosis not present

## 2019-09-06 DIAGNOSIS — K529 Noninfective gastroenteritis and colitis, unspecified: Secondary | ICD-10-CM | POA: Diagnosis not present

## 2019-09-06 DIAGNOSIS — C50912 Malignant neoplasm of unspecified site of left female breast: Secondary | ICD-10-CM | POA: Diagnosis not present

## 2019-09-06 DIAGNOSIS — M797 Fibromyalgia: Secondary | ICD-10-CM | POA: Diagnosis not present

## 2019-09-06 DIAGNOSIS — C50412 Malignant neoplasm of upper-outer quadrant of left female breast: Secondary | ICD-10-CM | POA: Diagnosis not present

## 2019-09-06 DIAGNOSIS — Z7984 Long term (current) use of oral hypoglycemic drugs: Secondary | ICD-10-CM | POA: Diagnosis not present

## 2019-09-06 DIAGNOSIS — E785 Hyperlipidemia, unspecified: Secondary | ICD-10-CM | POA: Diagnosis not present

## 2019-09-06 DIAGNOSIS — J449 Chronic obstructive pulmonary disease, unspecified: Secondary | ICD-10-CM | POA: Diagnosis not present

## 2019-09-06 DIAGNOSIS — E7800 Pure hypercholesterolemia, unspecified: Secondary | ICD-10-CM | POA: Diagnosis not present

## 2019-09-06 DIAGNOSIS — Z09 Encounter for follow-up examination after completed treatment for conditions other than malignant neoplasm: Secondary | ICD-10-CM | POA: Diagnosis not present

## 2019-09-06 DIAGNOSIS — Z171 Estrogen receptor negative status [ER-]: Secondary | ICD-10-CM | POA: Diagnosis not present

## 2019-09-06 DIAGNOSIS — I471 Supraventricular tachycardia: Secondary | ICD-10-CM | POA: Diagnosis not present

## 2019-09-06 DIAGNOSIS — E1140 Type 2 diabetes mellitus with diabetic neuropathy, unspecified: Secondary | ICD-10-CM | POA: Diagnosis not present

## 2019-09-08 DIAGNOSIS — Z171 Estrogen receptor negative status [ER-]: Secondary | ICD-10-CM | POA: Diagnosis not present

## 2019-09-08 DIAGNOSIS — C50412 Malignant neoplasm of upper-outer quadrant of left female breast: Secondary | ICD-10-CM | POA: Diagnosis not present

## 2019-09-08 DIAGNOSIS — C50912 Malignant neoplasm of unspecified site of left female breast: Secondary | ICD-10-CM | POA: Diagnosis not present

## 2019-09-08 DIAGNOSIS — I700 Atherosclerosis of aorta: Secondary | ICD-10-CM | POA: Diagnosis not present

## 2019-09-08 DIAGNOSIS — Z9012 Acquired absence of left breast and nipple: Secondary | ICD-10-CM | POA: Diagnosis not present

## 2019-09-08 DIAGNOSIS — Z5112 Encounter for antineoplastic immunotherapy: Secondary | ICD-10-CM | POA: Diagnosis not present

## 2019-09-08 DIAGNOSIS — C7989 Secondary malignant neoplasm of other specified sites: Secondary | ICD-10-CM | POA: Diagnosis not present

## 2019-09-10 ENCOUNTER — Ambulatory Visit (INDEPENDENT_AMBULATORY_CARE_PROVIDER_SITE_OTHER): Payer: Medicare Other | Admitting: Cardiovascular Disease

## 2019-09-10 ENCOUNTER — Encounter: Payer: Self-pay | Admitting: Cardiovascular Disease

## 2019-09-10 ENCOUNTER — Other Ambulatory Visit: Payer: Self-pay

## 2019-09-10 VITALS — BP 138/72 | HR 76 | Temp 96.9°F | Ht 67.5 in | Wt 262.0 lb

## 2019-09-10 DIAGNOSIS — I10 Essential (primary) hypertension: Secondary | ICD-10-CM | POA: Diagnosis not present

## 2019-09-10 DIAGNOSIS — I471 Supraventricular tachycardia: Secondary | ICD-10-CM | POA: Diagnosis not present

## 2019-09-10 DIAGNOSIS — I824Z1 Acute embolism and thrombosis of unspecified deep veins of right distal lower extremity: Secondary | ICD-10-CM

## 2019-09-10 DIAGNOSIS — R55 Syncope and collapse: Secondary | ICD-10-CM

## 2019-09-10 DIAGNOSIS — E785 Hyperlipidemia, unspecified: Secondary | ICD-10-CM | POA: Diagnosis not present

## 2019-09-10 NOTE — Patient Instructions (Signed)

## 2019-09-10 NOTE — Progress Notes (Addendum)
SUBJECTIVE:  The patient presents for routine follow-up.  She was diagnosed with SVT in May 2019.  It appeared to be a long RP tachycardia and likely an atrial tachycardia.  Past medical history includes triple negative high-grade invasive ductal carcinoma of the left breast treated with Cytoxan and Adriamycin.  She also has type 2 diabetes mellitus, diffuse arthritis and fibromyalgia.  She underwent a normal coronary angiogram on 04/26/2004. She subsequently underwent a normal nuclear stress test on 03/03/2010.  The patient wore an event monitor for a brief period of time which showed sinus rhythm.  Echocardiogram on 04/24/2018 showed normal left ventricular systolic function and regional wall motion, LVEF 55 to 60%, mild LVH, and grade 1 diastolic dysfunction.  She also complained of episodes of near syncope when I last saw her on 11/13/2018.  She has been having problems with orthostatic hypotension over the last several months.  She has some exertional dyspnea as well.  I reviewed an echocardiogram dated 07/30/2019 which showed LVEF greater than 55%.  She follows with oncology at [facility] in [place].  I reviewed labs dated 09/06/2019: Hemoglobin 10.5, phosphorus 2.3, BUN 18, creatinine 1.47.  When she stands up her blood pressure will drop to 90/60.  Her oncologist prescribed a walker.  She has had issues with chronic diarrhea.  The most water she can drink in a day is two 16 ounce bottles but then she develops diarrhea.  She has been referred to GI by oncology.  I reviewed oncology notes.  She developed right leg DVT in April 2020 and takes Xarelto.   Review of Systems: As per "subjective", otherwise negative.  Allergies  Allergen Reactions  . Magnesium Sulfate Shortness Of Breath    Experienced shortness of breath and chest pain during mag sulfate infusion. -11/21/17 -gwd  . Other Anaphylaxis and Other (See Comments)    Lysol and other cleaning products  . Cortisone    Patient passed out   . Iohexol Other (See Comments)     Code: HIVES, Desc: PT STATES SHE WAS GIVEN IV DYE AT [facility] AND BROKE OUT IN HIVES, RASH, AND HAD TO BE HOSPITALIZED DUE TO REACTION   . Ivp Dye [Iodinated Diagnostic Agents] Other (See Comments)    Unknown  . Januvia [Sitagliptin]     Stomach pains   . Latex Itching  . Motrin [Ibuprofen] Other (See Comments)    Unknown  . Nsaids Other (See Comments)    Unknown  . Statins Other (See Comments)    Unknown  . Sulfa Antibiotics Other (See Comments)    Unknown  . Adhesive [Tape] Rash    Current Outpatient Medications  Medication Sig Dispense Refill  . ALPRAZolam (XANAX) 0.5 MG tablet Take 0.5 mg by mouth 4 (four) times daily as needed for anxiety.     . DPH-Lido-AlHydr-MgHydr-Simeth (FIRST-MOUTHWASH BLM) SUSP 10 mL by Mouth route 4 (four) times a day as needed. Swish and swallow    . HYDROcodone-acetaminophen (NORCO) 7.5-325 MG tablet TAKE ONE TABLET BY MOUTH FOUR TIMES DAILY AS NEEDED FOR PAIN.    . hydrOXYzine (ATARAX/VISTARIL) 50 MG tablet Take 50 mg by mouth every other day. Every other day at bedtime and as needed    . magnesium oxide (MAG-OX) 400 (241.3 Mg) MG tablet Take 1 tablet (400 mg total) by mouth 3 (three) times daily between meals. (Patient taking differently: Take 400 mg by mouth daily. ) 90 tablet 6  . metFORMIN (GLUCOPHAGE) 500 MG tablet Take 500  mg by mouth 2 (two) times daily with a meal.     . metoprolol succinate (TOPROL-XL) 100 MG 24 hr tablet Take 1 tablet (100 mg total) by mouth daily. Take with or immediately following a meal. 30 tablet 6  . nitroGLYCERIN (NITROSTAT) 0.4 MG SL tablet Place 0.4 mg under the tongue every 5 (five) minutes as needed for chest pain.    Marland Kitchen omeprazole (PRILOSEC) 40 MG capsule Take 40 mg every other day by mouth. At night    . pravastatin (PRAVACHOL) 40 MG tablet Take 40 mg by mouth daily.    . promethazine (PHENERGAN) 25 MG tablet Take 1 tablet (25 mg total) by mouth every 6 (six)  hours as needed for nausea or vomiting. 30 tablet 3  . XARELTO 20 MG TABS tablet TAKE ONE TABLET BY MOUTH DAILY WITH EVENING MEAL.     No current facility-administered medications for this visit.     Past Medical History:  Diagnosis Date  . Anxiety   . Arthritis   . Back pain   . Byssinosis ([place])    secondary to working in a [employer]  . Chest pain   . Chronic pain   . Depression   . Diabetes mellitus without complication ([place])   . Fibromyalgia   . GERD (gastroesophageal reflux disease)   . Hypercholesteremia   . Hypertension   . Hypokalemia   . Hypomagnesemia   . Knee pain   . Obesity   . Peptic ulcer    history of - normal GI studies Sept 2010  . PSVT (paroxysmal supraventricular tachycardia) ([place])    a. dx 03/2018 - long RP likely atrial tachycardia.    Past Surgical History:  Procedure Laterality Date  . APPENDECTOMY    . bladder rectal repair    . BREAST SURGERY Left    breast biopsy- benign  . CHOLECYSTECTOMY    . DILATION AND CURETTAGE OF UTERUS    . GANGLION CYST EXCISION Left   . MASTECTOMY MODIFIED RADICAL Left 09/15/2017   Procedure: LEFT MODIFIED RADICAL MASTECTOMY;  Surgeon: Aviva Signs, MD;  Location: [facility];  Service: General;  Laterality: Left;  . PORTACATH PLACEMENT Right 10/13/2017   Procedure: INSERTION PORT-A-CATH RIGHT SUBCLAVIAN;  Surgeon: Aviva Signs, MD;  Location: [facility];  Service: General;  Laterality: Right;  . TOTAL ABDOMINAL HYSTERECTOMY      Social History   Socioeconomic History  . Marital status: Married    Spouse name: Not on file  . Number of children: Not on file  . Years of education: Not on file  . Highest education level: Not on file  Occupational History  . 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 Financial resource strain: Not on file  . Food insecurity    Worry: Not on file    Inability: Not on file  . Transportation needs    Medical: Not on file     Non-medical: Not on file  Tobacco Use  . Smoking status: Never Smoker  . Smokeless tobacco: Current User    Types: Snuff  Substance and Sexual Activity  . Alcohol use: No  . Drug use: No  . Sexual activity: Never    Birth control/protection: Surgical  Lifestyle  . Physical activity    Days per week: Not on file    Minutes per session: Not on file  . Stress: Not on file  Relationships  . Social connections  Talks on phone: Not on file    Gets together: Not on file    Attends religious service: Not on file    Active member of club or organization: Not on file    Attends meetings of clubs or organizations: Not on file    Relationship status: Not on file  . Intimate partner violence    Fear of current or ex partner: Not on file    Emotionally abused: Not on file    Physically abused: Not on file    Forced sexual activity: Not on file  Other Topics Concern  . Not on file  Social History Narrative  . Not on file     Vitals:   09/10/19 0933  BP: 138/72  Pulse: 76  Temp: (!) 96.9 F (36.1 C)  TempSrc: Temporal  SpO2: 98%  Height: 5' 7.5" (1.715 m)    Wt Readings from Last 3 Encounters:  12/09/18 277 lb (125.6 kg)  11/13/18 277 lb (125.6 kg)  09/10/18 271 lb (122.9 kg)     PHYSICAL EXAM General: NAD HEENT: Normal. Neck: No JVD, no thyromegaly. Lungs: Clear to auscultation bilaterally with normal respiratory effort. CV: Regular rate and rhythm, normal S1/S2, no S3/S4, no murmur. No pretibial or periankle edema.   Abdomen: Soft, nontender, no distention.  Neurologic: Alert and oriented.  Psych: Normal affect. Skin: Normal. Musculoskeletal: No gross deformities.      Labs: Lab Results  Component Value Date/Time   K 4.0 07/30/2018 11:19 AM   BUN 12 07/30/2018 11:19 AM   CREATININE 0.93 07/30/2018 11:19 AM   ALT 21 07/30/2018 11:19 AM   TSH 1.613 03/31/2018 02:20 PM   HGB 12.9 07/30/2018 11:19 AM     Lipids: Lab Results  Component Value Date/Time    LDLCALC 77 02/16/2018 11:22 AM   CHOL 166 02/16/2018 11:22 AM   TRIG 196 (H) 02/16/2018 11:22 AM   HDL 50 02/16/2018 11:22 AM       ASSESSMENT AND PLAN: 1.  PSVT/atrial tachycardia: Symptomatically stable on Toprol-XL 100 mg daily.  No changes.  2.  Hyperlipidemia: Continue pravastatin.  3.  Near syncope/orthostatic hypotension: This is likely due to GI losses from chronic diarrhea.  She has been referred to GI by oncology.  She uses a walker to ambulate.  Cardiac function is normal by echocardiogram on 07/30/2019.  I also recommended the use of compression stockings, either knee-high or thigh-high.  4.  Hypertension: Controlled on present therapy.  No changes.  5.  Right leg DVT: On Xarelto.  Diagnosed in April 2020.    Disposition: Follow up 1 year   Kate Sable, M.D., F.A.C.C.

## 2019-09-14 DIAGNOSIS — I471 Supraventricular tachycardia: Secondary | ICD-10-CM | POA: Diagnosis not present

## 2019-09-14 DIAGNOSIS — Z7984 Long term (current) use of oral hypoglycemic drugs: Secondary | ICD-10-CM | POA: Diagnosis not present

## 2019-09-14 DIAGNOSIS — E7800 Pure hypercholesterolemia, unspecified: Secondary | ICD-10-CM | POA: Diagnosis not present

## 2019-09-14 DIAGNOSIS — E1140 Type 2 diabetes mellitus with diabetic neuropathy, unspecified: Secondary | ICD-10-CM | POA: Diagnosis not present

## 2019-09-14 DIAGNOSIS — I10 Essential (primary) hypertension: Secondary | ICD-10-CM | POA: Diagnosis not present

## 2019-09-14 DIAGNOSIS — C7989 Secondary malignant neoplasm of other specified sites: Secondary | ICD-10-CM | POA: Diagnosis not present

## 2019-09-14 DIAGNOSIS — E785 Hyperlipidemia, unspecified: Secondary | ICD-10-CM | POA: Diagnosis not present

## 2019-09-14 DIAGNOSIS — C50412 Malignant neoplasm of upper-outer quadrant of left female breast: Secondary | ICD-10-CM | POA: Diagnosis not present

## 2019-09-14 DIAGNOSIS — J449 Chronic obstructive pulmonary disease, unspecified: Secondary | ICD-10-CM | POA: Diagnosis not present

## 2019-09-16 DIAGNOSIS — E1140 Type 2 diabetes mellitus with diabetic neuropathy, unspecified: Secondary | ICD-10-CM | POA: Diagnosis not present

## 2019-09-16 DIAGNOSIS — E7800 Pure hypercholesterolemia, unspecified: Secondary | ICD-10-CM | POA: Diagnosis not present

## 2019-09-16 DIAGNOSIS — J449 Chronic obstructive pulmonary disease, unspecified: Secondary | ICD-10-CM | POA: Diagnosis not present

## 2019-09-16 DIAGNOSIS — C7989 Secondary malignant neoplasm of other specified sites: Secondary | ICD-10-CM | POA: Diagnosis not present

## 2019-09-16 DIAGNOSIS — E785 Hyperlipidemia, unspecified: Secondary | ICD-10-CM | POA: Diagnosis not present

## 2019-09-16 DIAGNOSIS — I471 Supraventricular tachycardia: Secondary | ICD-10-CM | POA: Diagnosis not present

## 2019-09-16 DIAGNOSIS — C50412 Malignant neoplasm of upper-outer quadrant of left female breast: Secondary | ICD-10-CM | POA: Diagnosis not present

## 2019-09-16 DIAGNOSIS — I10 Essential (primary) hypertension: Secondary | ICD-10-CM | POA: Diagnosis not present

## 2019-09-16 DIAGNOSIS — Z7984 Long term (current) use of oral hypoglycemic drugs: Secondary | ICD-10-CM | POA: Diagnosis not present

## 2019-09-27 DIAGNOSIS — Z510 Encounter for antineoplastic radiation therapy: Secondary | ICD-10-CM | POA: Diagnosis not present

## 2019-09-27 DIAGNOSIS — C50912 Malignant neoplasm of unspecified site of left female breast: Secondary | ICD-10-CM | POA: Diagnosis not present

## 2019-09-27 DIAGNOSIS — Z9012 Acquired absence of left breast and nipple: Secondary | ICD-10-CM | POA: Diagnosis not present

## 2019-09-27 DIAGNOSIS — Z171 Estrogen receptor negative status [ER-]: Secondary | ICD-10-CM | POA: Diagnosis not present

## 2019-09-27 DIAGNOSIS — E559 Vitamin D deficiency, unspecified: Secondary | ICD-10-CM | POA: Diagnosis not present

## 2019-09-27 DIAGNOSIS — C50412 Malignant neoplasm of upper-outer quadrant of left female breast: Secondary | ICD-10-CM | POA: Diagnosis not present

## 2019-09-27 DIAGNOSIS — Z9221 Personal history of antineoplastic chemotherapy: Secondary | ICD-10-CM | POA: Diagnosis not present

## 2019-09-27 DIAGNOSIS — Z09 Encounter for follow-up examination after completed treatment for conditions other than malignant neoplasm: Secondary | ICD-10-CM | POA: Diagnosis not present

## 2019-09-27 DIAGNOSIS — K047 Periapical abscess without sinus: Secondary | ICD-10-CM | POA: Diagnosis not present

## 2019-09-27 DIAGNOSIS — C7989 Secondary malignant neoplasm of other specified sites: Secondary | ICD-10-CM | POA: Diagnosis not present

## 2019-09-27 DIAGNOSIS — Z853 Personal history of malignant neoplasm of breast: Secondary | ICD-10-CM | POA: Diagnosis not present

## 2019-09-28 DIAGNOSIS — Z510 Encounter for antineoplastic radiation therapy: Secondary | ICD-10-CM | POA: Diagnosis not present

## 2019-09-28 DIAGNOSIS — Z9012 Acquired absence of left breast and nipple: Secondary | ICD-10-CM | POA: Diagnosis not present

## 2019-09-28 DIAGNOSIS — C7989 Secondary malignant neoplasm of other specified sites: Secondary | ICD-10-CM | POA: Diagnosis not present

## 2019-09-28 DIAGNOSIS — Z9221 Personal history of antineoplastic chemotherapy: Secondary | ICD-10-CM | POA: Diagnosis not present

## 2019-09-28 DIAGNOSIS — C50412 Malignant neoplasm of upper-outer quadrant of left female breast: Secondary | ICD-10-CM | POA: Diagnosis not present

## 2019-09-28 DIAGNOSIS — Z853 Personal history of malignant neoplasm of breast: Secondary | ICD-10-CM | POA: Diagnosis not present

## 2019-09-29 DIAGNOSIS — C50412 Malignant neoplasm of upper-outer quadrant of left female breast: Secondary | ICD-10-CM | POA: Diagnosis not present

## 2019-09-29 DIAGNOSIS — Z510 Encounter for antineoplastic radiation therapy: Secondary | ICD-10-CM | POA: Diagnosis not present

## 2019-09-29 DIAGNOSIS — Z5112 Encounter for antineoplastic immunotherapy: Secondary | ICD-10-CM | POA: Diagnosis not present

## 2019-09-29 DIAGNOSIS — Z9012 Acquired absence of left breast and nipple: Secondary | ICD-10-CM | POA: Diagnosis not present

## 2019-09-29 DIAGNOSIS — Z171 Estrogen receptor negative status [ER-]: Secondary | ICD-10-CM | POA: Diagnosis not present

## 2019-09-29 DIAGNOSIS — Z9221 Personal history of antineoplastic chemotherapy: Secondary | ICD-10-CM | POA: Diagnosis not present

## 2019-09-29 DIAGNOSIS — C7989 Secondary malignant neoplasm of other specified sites: Secondary | ICD-10-CM | POA: Diagnosis not present

## 2019-09-29 DIAGNOSIS — Z853 Personal history of malignant neoplasm of breast: Secondary | ICD-10-CM | POA: Diagnosis not present

## 2019-09-30 DIAGNOSIS — Z853 Personal history of malignant neoplasm of breast: Secondary | ICD-10-CM | POA: Diagnosis not present

## 2019-09-30 DIAGNOSIS — Z9012 Acquired absence of left breast and nipple: Secondary | ICD-10-CM | POA: Diagnosis not present

## 2019-09-30 DIAGNOSIS — Z510 Encounter for antineoplastic radiation therapy: Secondary | ICD-10-CM | POA: Diagnosis not present

## 2019-09-30 DIAGNOSIS — C7989 Secondary malignant neoplasm of other specified sites: Secondary | ICD-10-CM | POA: Diagnosis not present

## 2019-09-30 DIAGNOSIS — Z9221 Personal history of antineoplastic chemotherapy: Secondary | ICD-10-CM | POA: Diagnosis not present

## 2019-10-01 DIAGNOSIS — Z510 Encounter for antineoplastic radiation therapy: Secondary | ICD-10-CM | POA: Diagnosis not present

## 2019-10-01 DIAGNOSIS — C7989 Secondary malignant neoplasm of other specified sites: Secondary | ICD-10-CM | POA: Diagnosis not present

## 2019-10-01 DIAGNOSIS — Z853 Personal history of malignant neoplasm of breast: Secondary | ICD-10-CM | POA: Diagnosis not present

## 2019-10-01 DIAGNOSIS — Z9012 Acquired absence of left breast and nipple: Secondary | ICD-10-CM | POA: Diagnosis not present

## 2019-10-01 DIAGNOSIS — Z9221 Personal history of antineoplastic chemotherapy: Secondary | ICD-10-CM | POA: Diagnosis not present

## 2019-10-04 DIAGNOSIS — Z9012 Acquired absence of left breast and nipple: Secondary | ICD-10-CM | POA: Diagnosis not present

## 2019-10-04 DIAGNOSIS — Z853 Personal history of malignant neoplasm of breast: Secondary | ICD-10-CM | POA: Diagnosis not present

## 2019-10-04 DIAGNOSIS — Z9221 Personal history of antineoplastic chemotherapy: Secondary | ICD-10-CM | POA: Diagnosis not present

## 2019-10-04 DIAGNOSIS — C7989 Secondary malignant neoplasm of other specified sites: Secondary | ICD-10-CM | POA: Diagnosis not present

## 2019-10-04 DIAGNOSIS — Z510 Encounter for antineoplastic radiation therapy: Secondary | ICD-10-CM | POA: Diagnosis not present

## 2019-10-05 DIAGNOSIS — Z9221 Personal history of antineoplastic chemotherapy: Secondary | ICD-10-CM | POA: Diagnosis not present

## 2019-10-05 DIAGNOSIS — Z510 Encounter for antineoplastic radiation therapy: Secondary | ICD-10-CM | POA: Diagnosis not present

## 2019-10-05 DIAGNOSIS — C50412 Malignant neoplasm of upper-outer quadrant of left female breast: Secondary | ICD-10-CM | POA: Diagnosis not present

## 2019-10-05 DIAGNOSIS — Z9012 Acquired absence of left breast and nipple: Secondary | ICD-10-CM | POA: Diagnosis not present

## 2019-10-05 DIAGNOSIS — Z853 Personal history of malignant neoplasm of breast: Secondary | ICD-10-CM | POA: Diagnosis not present

## 2019-10-05 DIAGNOSIS — C7989 Secondary malignant neoplasm of other specified sites: Secondary | ICD-10-CM | POA: Diagnosis not present

## 2019-10-06 DIAGNOSIS — C7989 Secondary malignant neoplasm of other specified sites: Secondary | ICD-10-CM | POA: Diagnosis not present

## 2019-10-06 DIAGNOSIS — Z171 Estrogen receptor negative status [ER-]: Secondary | ICD-10-CM | POA: Diagnosis not present

## 2019-10-06 DIAGNOSIS — Z9012 Acquired absence of left breast and nipple: Secondary | ICD-10-CM | POA: Diagnosis not present

## 2019-10-06 DIAGNOSIS — K047 Periapical abscess without sinus: Secondary | ICD-10-CM | POA: Diagnosis not present

## 2019-10-06 DIAGNOSIS — Z09 Encounter for follow-up examination after completed treatment for conditions other than malignant neoplasm: Secondary | ICD-10-CM | POA: Diagnosis not present

## 2019-10-06 DIAGNOSIS — Z853 Personal history of malignant neoplasm of breast: Secondary | ICD-10-CM | POA: Diagnosis not present

## 2019-10-06 DIAGNOSIS — Z9221 Personal history of antineoplastic chemotherapy: Secondary | ICD-10-CM | POA: Diagnosis not present

## 2019-10-06 DIAGNOSIS — C50412 Malignant neoplasm of upper-outer quadrant of left female breast: Secondary | ICD-10-CM | POA: Diagnosis not present

## 2019-10-06 DIAGNOSIS — Z510 Encounter for antineoplastic radiation therapy: Secondary | ICD-10-CM | POA: Diagnosis not present

## 2019-10-07 DIAGNOSIS — Z853 Personal history of malignant neoplasm of breast: Secondary | ICD-10-CM | POA: Diagnosis not present

## 2019-10-07 DIAGNOSIS — Z510 Encounter for antineoplastic radiation therapy: Secondary | ICD-10-CM | POA: Diagnosis not present

## 2019-10-07 DIAGNOSIS — Z9221 Personal history of antineoplastic chemotherapy: Secondary | ICD-10-CM | POA: Diagnosis not present

## 2019-10-07 DIAGNOSIS — M79604 Pain in right leg: Secondary | ICD-10-CM | POA: Diagnosis not present

## 2019-10-07 DIAGNOSIS — Z9012 Acquired absence of left breast and nipple: Secondary | ICD-10-CM | POA: Diagnosis not present

## 2019-10-07 DIAGNOSIS — C7989 Secondary malignant neoplasm of other specified sites: Secondary | ICD-10-CM | POA: Diagnosis not present

## 2019-10-07 DIAGNOSIS — S8991XA Unspecified injury of right lower leg, initial encounter: Secondary | ICD-10-CM | POA: Diagnosis not present

## 2019-10-08 DIAGNOSIS — E782 Mixed hyperlipidemia: Secondary | ICD-10-CM | POA: Diagnosis not present

## 2019-10-08 DIAGNOSIS — Z9221 Personal history of antineoplastic chemotherapy: Secondary | ICD-10-CM | POA: Diagnosis not present

## 2019-10-08 DIAGNOSIS — C7989 Secondary malignant neoplasm of other specified sites: Secondary | ICD-10-CM | POA: Diagnosis not present

## 2019-10-08 DIAGNOSIS — Z510 Encounter for antineoplastic radiation therapy: Secondary | ICD-10-CM | POA: Diagnosis not present

## 2019-10-08 DIAGNOSIS — I471 Supraventricular tachycardia: Secondary | ICD-10-CM | POA: Diagnosis not present

## 2019-10-08 DIAGNOSIS — Z9012 Acquired absence of left breast and nipple: Secondary | ICD-10-CM | POA: Diagnosis not present

## 2019-10-08 DIAGNOSIS — I10 Essential (primary) hypertension: Secondary | ICD-10-CM | POA: Diagnosis not present

## 2019-10-08 DIAGNOSIS — Z853 Personal history of malignant neoplasm of breast: Secondary | ICD-10-CM | POA: Diagnosis not present

## 2019-10-08 DIAGNOSIS — K219 Gastro-esophageal reflux disease without esophagitis: Secondary | ICD-10-CM | POA: Diagnosis not present

## 2019-10-08 DIAGNOSIS — E1165 Type 2 diabetes mellitus with hyperglycemia: Secondary | ICD-10-CM | POA: Diagnosis not present

## 2019-10-11 DIAGNOSIS — C7989 Secondary malignant neoplasm of other specified sites: Secondary | ICD-10-CM | POA: Diagnosis not present

## 2019-10-11 DIAGNOSIS — Z9012 Acquired absence of left breast and nipple: Secondary | ICD-10-CM | POA: Diagnosis not present

## 2019-10-11 DIAGNOSIS — Z853 Personal history of malignant neoplasm of breast: Secondary | ICD-10-CM | POA: Diagnosis not present

## 2019-10-11 DIAGNOSIS — Z9221 Personal history of antineoplastic chemotherapy: Secondary | ICD-10-CM | POA: Diagnosis not present

## 2019-10-11 DIAGNOSIS — Z510 Encounter for antineoplastic radiation therapy: Secondary | ICD-10-CM | POA: Diagnosis not present

## 2019-10-12 DIAGNOSIS — Z9221 Personal history of antineoplastic chemotherapy: Secondary | ICD-10-CM | POA: Diagnosis not present

## 2019-10-12 DIAGNOSIS — C50412 Malignant neoplasm of upper-outer quadrant of left female breast: Secondary | ICD-10-CM | POA: Diagnosis not present

## 2019-10-12 DIAGNOSIS — C7989 Secondary malignant neoplasm of other specified sites: Secondary | ICD-10-CM | POA: Diagnosis not present

## 2019-10-12 DIAGNOSIS — Z853 Personal history of malignant neoplasm of breast: Secondary | ICD-10-CM | POA: Diagnosis not present

## 2019-10-12 DIAGNOSIS — Z510 Encounter for antineoplastic radiation therapy: Secondary | ICD-10-CM | POA: Diagnosis not present

## 2019-10-12 DIAGNOSIS — Z9012 Acquired absence of left breast and nipple: Secondary | ICD-10-CM | POA: Diagnosis not present

## 2019-10-13 DIAGNOSIS — Z9012 Acquired absence of left breast and nipple: Secondary | ICD-10-CM | POA: Diagnosis not present

## 2019-10-13 DIAGNOSIS — Z9221 Personal history of antineoplastic chemotherapy: Secondary | ICD-10-CM | POA: Diagnosis not present

## 2019-10-13 DIAGNOSIS — C50412 Malignant neoplasm of upper-outer quadrant of left female breast: Secondary | ICD-10-CM | POA: Diagnosis not present

## 2019-10-13 DIAGNOSIS — Z510 Encounter for antineoplastic radiation therapy: Secondary | ICD-10-CM | POA: Diagnosis not present

## 2019-10-13 DIAGNOSIS — Z853 Personal history of malignant neoplasm of breast: Secondary | ICD-10-CM | POA: Diagnosis not present

## 2019-10-13 DIAGNOSIS — C7989 Secondary malignant neoplasm of other specified sites: Secondary | ICD-10-CM | POA: Diagnosis not present

## 2019-10-14 DIAGNOSIS — Z9012 Acquired absence of left breast and nipple: Secondary | ICD-10-CM | POA: Diagnosis not present

## 2019-10-14 DIAGNOSIS — C7989 Secondary malignant neoplasm of other specified sites: Secondary | ICD-10-CM | POA: Diagnosis not present

## 2019-10-14 DIAGNOSIS — Z510 Encounter for antineoplastic radiation therapy: Secondary | ICD-10-CM | POA: Diagnosis not present

## 2019-10-14 DIAGNOSIS — Z9221 Personal history of antineoplastic chemotherapy: Secondary | ICD-10-CM | POA: Diagnosis not present

## 2019-10-14 DIAGNOSIS — Z853 Personal history of malignant neoplasm of breast: Secondary | ICD-10-CM | POA: Diagnosis not present

## 2019-10-15 DIAGNOSIS — Z853 Personal history of malignant neoplasm of breast: Secondary | ICD-10-CM | POA: Diagnosis not present

## 2019-10-15 DIAGNOSIS — Z510 Encounter for antineoplastic radiation therapy: Secondary | ICD-10-CM | POA: Diagnosis not present

## 2019-10-15 DIAGNOSIS — C7989 Secondary malignant neoplasm of other specified sites: Secondary | ICD-10-CM | POA: Diagnosis not present

## 2019-10-15 DIAGNOSIS — Z9012 Acquired absence of left breast and nipple: Secondary | ICD-10-CM | POA: Diagnosis not present

## 2019-10-15 DIAGNOSIS — Z9221 Personal history of antineoplastic chemotherapy: Secondary | ICD-10-CM | POA: Diagnosis not present

## 2019-10-18 DIAGNOSIS — C50412 Malignant neoplasm of upper-outer quadrant of left female breast: Secondary | ICD-10-CM | POA: Diagnosis not present

## 2019-10-18 DIAGNOSIS — C7989 Secondary malignant neoplasm of other specified sites: Secondary | ICD-10-CM | POA: Diagnosis not present

## 2019-10-18 DIAGNOSIS — Z9012 Acquired absence of left breast and nipple: Secondary | ICD-10-CM | POA: Diagnosis not present

## 2019-10-18 DIAGNOSIS — Z510 Encounter for antineoplastic radiation therapy: Secondary | ICD-10-CM | POA: Diagnosis not present

## 2019-10-18 DIAGNOSIS — Z853 Personal history of malignant neoplasm of breast: Secondary | ICD-10-CM | POA: Diagnosis not present

## 2019-10-18 DIAGNOSIS — K047 Periapical abscess without sinus: Secondary | ICD-10-CM | POA: Diagnosis not present

## 2019-10-18 DIAGNOSIS — Z171 Estrogen receptor negative status [ER-]: Secondary | ICD-10-CM | POA: Diagnosis not present

## 2019-10-18 DIAGNOSIS — C50912 Malignant neoplasm of unspecified site of left female breast: Secondary | ICD-10-CM | POA: Diagnosis not present

## 2019-10-18 DIAGNOSIS — K529 Noninfective gastroenteritis and colitis, unspecified: Secondary | ICD-10-CM | POA: Diagnosis not present

## 2019-10-18 DIAGNOSIS — Z09 Encounter for follow-up examination after completed treatment for conditions other than malignant neoplasm: Secondary | ICD-10-CM | POA: Diagnosis not present

## 2019-10-18 DIAGNOSIS — Z9221 Personal history of antineoplastic chemotherapy: Secondary | ICD-10-CM | POA: Diagnosis not present

## 2019-10-19 DIAGNOSIS — Z9221 Personal history of antineoplastic chemotherapy: Secondary | ICD-10-CM | POA: Diagnosis not present

## 2019-10-19 DIAGNOSIS — C50412 Malignant neoplasm of upper-outer quadrant of left female breast: Secondary | ICD-10-CM | POA: Diagnosis not present

## 2019-10-19 DIAGNOSIS — Z9012 Acquired absence of left breast and nipple: Secondary | ICD-10-CM | POA: Diagnosis not present

## 2019-10-19 DIAGNOSIS — Z510 Encounter for antineoplastic radiation therapy: Secondary | ICD-10-CM | POA: Diagnosis not present

## 2019-10-19 DIAGNOSIS — Z853 Personal history of malignant neoplasm of breast: Secondary | ICD-10-CM | POA: Diagnosis not present

## 2019-10-19 DIAGNOSIS — C7989 Secondary malignant neoplasm of other specified sites: Secondary | ICD-10-CM | POA: Diagnosis not present

## 2019-10-20 DIAGNOSIS — Z510 Encounter for antineoplastic radiation therapy: Secondary | ICD-10-CM | POA: Diagnosis not present

## 2019-10-20 DIAGNOSIS — Z171 Estrogen receptor negative status [ER-]: Secondary | ICD-10-CM | POA: Diagnosis not present

## 2019-10-20 DIAGNOSIS — Z9012 Acquired absence of left breast and nipple: Secondary | ICD-10-CM | POA: Diagnosis not present

## 2019-10-20 DIAGNOSIS — Z5111 Encounter for antineoplastic chemotherapy: Secondary | ICD-10-CM | POA: Diagnosis not present

## 2019-10-20 DIAGNOSIS — Z853 Personal history of malignant neoplasm of breast: Secondary | ICD-10-CM | POA: Diagnosis not present

## 2019-10-20 DIAGNOSIS — Z9221 Personal history of antineoplastic chemotherapy: Secondary | ICD-10-CM | POA: Diagnosis not present

## 2019-10-20 DIAGNOSIS — C7989 Secondary malignant neoplasm of other specified sites: Secondary | ICD-10-CM | POA: Diagnosis not present

## 2019-10-20 DIAGNOSIS — Z95828 Presence of other vascular implants and grafts: Secondary | ICD-10-CM | POA: Diagnosis not present

## 2019-10-20 DIAGNOSIS — C50412 Malignant neoplasm of upper-outer quadrant of left female breast: Secondary | ICD-10-CM | POA: Diagnosis not present

## 2019-10-25 DIAGNOSIS — Z510 Encounter for antineoplastic radiation therapy: Secondary | ICD-10-CM | POA: Diagnosis not present

## 2019-10-25 DIAGNOSIS — Z853 Personal history of malignant neoplasm of breast: Secondary | ICD-10-CM | POA: Diagnosis not present

## 2019-10-25 DIAGNOSIS — Z9012 Acquired absence of left breast and nipple: Secondary | ICD-10-CM | POA: Diagnosis not present

## 2019-10-25 DIAGNOSIS — C7989 Secondary malignant neoplasm of other specified sites: Secondary | ICD-10-CM | POA: Diagnosis not present

## 2019-10-25 DIAGNOSIS — Z9221 Personal history of antineoplastic chemotherapy: Secondary | ICD-10-CM | POA: Diagnosis not present

## 2019-10-26 DIAGNOSIS — I471 Supraventricular tachycardia: Secondary | ICD-10-CM | POA: Diagnosis not present

## 2019-10-26 DIAGNOSIS — E785 Hyperlipidemia, unspecified: Secondary | ICD-10-CM | POA: Diagnosis not present

## 2019-10-26 DIAGNOSIS — E7800 Pure hypercholesterolemia, unspecified: Secondary | ICD-10-CM | POA: Diagnosis not present

## 2019-10-26 DIAGNOSIS — E1140 Type 2 diabetes mellitus with diabetic neuropathy, unspecified: Secondary | ICD-10-CM | POA: Diagnosis not present

## 2019-10-26 DIAGNOSIS — J449 Chronic obstructive pulmonary disease, unspecified: Secondary | ICD-10-CM | POA: Diagnosis not present

## 2019-10-26 DIAGNOSIS — C50412 Malignant neoplasm of upper-outer quadrant of left female breast: Secondary | ICD-10-CM | POA: Diagnosis not present

## 2019-10-26 DIAGNOSIS — I10 Essential (primary) hypertension: Secondary | ICD-10-CM | POA: Diagnosis not present

## 2019-10-26 DIAGNOSIS — C7989 Secondary malignant neoplasm of other specified sites: Secondary | ICD-10-CM | POA: Diagnosis not present

## 2019-10-26 DIAGNOSIS — Z7984 Long term (current) use of oral hypoglycemic drugs: Secondary | ICD-10-CM | POA: Diagnosis not present

## 2019-10-27 DIAGNOSIS — E785 Hyperlipidemia, unspecified: Secondary | ICD-10-CM | POA: Diagnosis not present

## 2019-10-27 DIAGNOSIS — I10 Essential (primary) hypertension: Secondary | ICD-10-CM | POA: Diagnosis not present

## 2019-10-27 DIAGNOSIS — C7989 Secondary malignant neoplasm of other specified sites: Secondary | ICD-10-CM | POA: Diagnosis not present

## 2019-10-27 DIAGNOSIS — E1140 Type 2 diabetes mellitus with diabetic neuropathy, unspecified: Secondary | ICD-10-CM | POA: Diagnosis not present

## 2019-10-27 DIAGNOSIS — Z7984 Long term (current) use of oral hypoglycemic drugs: Secondary | ICD-10-CM | POA: Diagnosis not present

## 2019-10-27 DIAGNOSIS — I471 Supraventricular tachycardia: Secondary | ICD-10-CM | POA: Diagnosis not present

## 2019-10-27 DIAGNOSIS — C50412 Malignant neoplasm of upper-outer quadrant of left female breast: Secondary | ICD-10-CM | POA: Diagnosis not present

## 2019-10-27 DIAGNOSIS — J449 Chronic obstructive pulmonary disease, unspecified: Secondary | ICD-10-CM | POA: Diagnosis not present

## 2019-10-27 DIAGNOSIS — E7800 Pure hypercholesterolemia, unspecified: Secondary | ICD-10-CM | POA: Diagnosis not present

## 2019-10-28 DIAGNOSIS — E785 Hyperlipidemia, unspecified: Secondary | ICD-10-CM | POA: Diagnosis not present

## 2019-10-28 DIAGNOSIS — J449 Chronic obstructive pulmonary disease, unspecified: Secondary | ICD-10-CM | POA: Diagnosis not present

## 2019-10-28 DIAGNOSIS — E1140 Type 2 diabetes mellitus with diabetic neuropathy, unspecified: Secondary | ICD-10-CM | POA: Diagnosis not present

## 2019-10-28 DIAGNOSIS — C50412 Malignant neoplasm of upper-outer quadrant of left female breast: Secondary | ICD-10-CM | POA: Diagnosis not present

## 2019-10-28 DIAGNOSIS — Z7984 Long term (current) use of oral hypoglycemic drugs: Secondary | ICD-10-CM | POA: Diagnosis not present

## 2019-10-28 DIAGNOSIS — E7800 Pure hypercholesterolemia, unspecified: Secondary | ICD-10-CM | POA: Diagnosis not present

## 2019-10-28 DIAGNOSIS — I471 Supraventricular tachycardia: Secondary | ICD-10-CM | POA: Diagnosis not present

## 2019-10-28 DIAGNOSIS — I10 Essential (primary) hypertension: Secondary | ICD-10-CM | POA: Diagnosis not present

## 2019-10-28 DIAGNOSIS — C7989 Secondary malignant neoplasm of other specified sites: Secondary | ICD-10-CM | POA: Diagnosis not present

## 2019-10-29 DIAGNOSIS — I10 Essential (primary) hypertension: Secondary | ICD-10-CM | POA: Diagnosis not present

## 2019-10-29 DIAGNOSIS — E7800 Pure hypercholesterolemia, unspecified: Secondary | ICD-10-CM | POA: Diagnosis not present

## 2019-10-29 DIAGNOSIS — E1140 Type 2 diabetes mellitus with diabetic neuropathy, unspecified: Secondary | ICD-10-CM | POA: Diagnosis not present

## 2019-10-29 DIAGNOSIS — E785 Hyperlipidemia, unspecified: Secondary | ICD-10-CM | POA: Diagnosis not present

## 2019-10-29 DIAGNOSIS — C7989 Secondary malignant neoplasm of other specified sites: Secondary | ICD-10-CM | POA: Diagnosis not present

## 2019-10-29 DIAGNOSIS — I471 Supraventricular tachycardia: Secondary | ICD-10-CM | POA: Diagnosis not present

## 2019-10-29 DIAGNOSIS — J449 Chronic obstructive pulmonary disease, unspecified: Secondary | ICD-10-CM | POA: Diagnosis not present

## 2019-10-29 DIAGNOSIS — C50412 Malignant neoplasm of upper-outer quadrant of left female breast: Secondary | ICD-10-CM | POA: Diagnosis not present

## 2019-10-29 DIAGNOSIS — Z7984 Long term (current) use of oral hypoglycemic drugs: Secondary | ICD-10-CM | POA: Diagnosis not present

## 2019-11-01 DIAGNOSIS — I471 Supraventricular tachycardia: Secondary | ICD-10-CM | POA: Diagnosis not present

## 2019-11-01 DIAGNOSIS — C50412 Malignant neoplasm of upper-outer quadrant of left female breast: Secondary | ICD-10-CM | POA: Diagnosis not present

## 2019-11-01 DIAGNOSIS — E1140 Type 2 diabetes mellitus with diabetic neuropathy, unspecified: Secondary | ICD-10-CM | POA: Diagnosis not present

## 2019-11-01 DIAGNOSIS — E785 Hyperlipidemia, unspecified: Secondary | ICD-10-CM | POA: Diagnosis not present

## 2019-11-01 DIAGNOSIS — I10 Essential (primary) hypertension: Secondary | ICD-10-CM | POA: Diagnosis not present

## 2019-11-01 DIAGNOSIS — C7989 Secondary malignant neoplasm of other specified sites: Secondary | ICD-10-CM | POA: Diagnosis not present

## 2019-11-01 DIAGNOSIS — J449 Chronic obstructive pulmonary disease, unspecified: Secondary | ICD-10-CM | POA: Diagnosis not present

## 2019-11-01 DIAGNOSIS — E7800 Pure hypercholesterolemia, unspecified: Secondary | ICD-10-CM | POA: Diagnosis not present

## 2019-11-01 DIAGNOSIS — Z7984 Long term (current) use of oral hypoglycemic drugs: Secondary | ICD-10-CM | POA: Diagnosis not present

## 2019-11-02 DIAGNOSIS — C7989 Secondary malignant neoplasm of other specified sites: Secondary | ICD-10-CM | POA: Diagnosis not present

## 2019-11-02 DIAGNOSIS — C50412 Malignant neoplasm of upper-outer quadrant of left female breast: Secondary | ICD-10-CM | POA: Diagnosis not present

## 2019-11-02 DIAGNOSIS — E7800 Pure hypercholesterolemia, unspecified: Secondary | ICD-10-CM | POA: Diagnosis not present

## 2019-11-02 DIAGNOSIS — J449 Chronic obstructive pulmonary disease, unspecified: Secondary | ICD-10-CM | POA: Diagnosis not present

## 2019-11-02 DIAGNOSIS — E785 Hyperlipidemia, unspecified: Secondary | ICD-10-CM | POA: Diagnosis not present

## 2019-11-02 DIAGNOSIS — E1140 Type 2 diabetes mellitus with diabetic neuropathy, unspecified: Secondary | ICD-10-CM | POA: Diagnosis not present

## 2019-11-02 DIAGNOSIS — Z7984 Long term (current) use of oral hypoglycemic drugs: Secondary | ICD-10-CM | POA: Diagnosis not present

## 2019-11-02 DIAGNOSIS — I10 Essential (primary) hypertension: Secondary | ICD-10-CM | POA: Diagnosis not present

## 2019-11-02 DIAGNOSIS — I471 Supraventricular tachycardia: Secondary | ICD-10-CM | POA: Diagnosis not present

## 2019-11-03 DIAGNOSIS — C7989 Secondary malignant neoplasm of other specified sites: Secondary | ICD-10-CM | POA: Diagnosis not present

## 2019-11-03 DIAGNOSIS — C50412 Malignant neoplasm of upper-outer quadrant of left female breast: Secondary | ICD-10-CM | POA: Diagnosis not present

## 2019-11-03 DIAGNOSIS — E7800 Pure hypercholesterolemia, unspecified: Secondary | ICD-10-CM | POA: Diagnosis not present

## 2019-11-03 DIAGNOSIS — J449 Chronic obstructive pulmonary disease, unspecified: Secondary | ICD-10-CM | POA: Diagnosis not present

## 2019-11-03 DIAGNOSIS — I10 Essential (primary) hypertension: Secondary | ICD-10-CM | POA: Diagnosis not present

## 2019-11-03 DIAGNOSIS — I471 Supraventricular tachycardia: Secondary | ICD-10-CM | POA: Diagnosis not present

## 2019-11-03 DIAGNOSIS — E785 Hyperlipidemia, unspecified: Secondary | ICD-10-CM | POA: Diagnosis not present

## 2019-11-03 DIAGNOSIS — E1140 Type 2 diabetes mellitus with diabetic neuropathy, unspecified: Secondary | ICD-10-CM | POA: Diagnosis not present

## 2019-11-03 DIAGNOSIS — Z7984 Long term (current) use of oral hypoglycemic drugs: Secondary | ICD-10-CM | POA: Diagnosis not present

## 2019-11-04 DIAGNOSIS — Z7984 Long term (current) use of oral hypoglycemic drugs: Secondary | ICD-10-CM | POA: Diagnosis not present

## 2019-11-04 DIAGNOSIS — C7989 Secondary malignant neoplasm of other specified sites: Secondary | ICD-10-CM | POA: Diagnosis not present

## 2019-11-04 DIAGNOSIS — I471 Supraventricular tachycardia: Secondary | ICD-10-CM | POA: Diagnosis not present

## 2019-11-04 DIAGNOSIS — E7800 Pure hypercholesterolemia, unspecified: Secondary | ICD-10-CM | POA: Diagnosis not present

## 2019-11-04 DIAGNOSIS — E785 Hyperlipidemia, unspecified: Secondary | ICD-10-CM | POA: Diagnosis not present

## 2019-11-04 DIAGNOSIS — J449 Chronic obstructive pulmonary disease, unspecified: Secondary | ICD-10-CM | POA: Diagnosis not present

## 2019-11-04 DIAGNOSIS — C50412 Malignant neoplasm of upper-outer quadrant of left female breast: Secondary | ICD-10-CM | POA: Diagnosis not present

## 2019-11-04 DIAGNOSIS — E1140 Type 2 diabetes mellitus with diabetic neuropathy, unspecified: Secondary | ICD-10-CM | POA: Diagnosis not present

## 2019-11-04 DIAGNOSIS — I10 Essential (primary) hypertension: Secondary | ICD-10-CM | POA: Diagnosis not present

## 2019-11-05 DIAGNOSIS — I10 Essential (primary) hypertension: Secondary | ICD-10-CM | POA: Diagnosis not present

## 2019-11-05 DIAGNOSIS — C7989 Secondary malignant neoplasm of other specified sites: Secondary | ICD-10-CM | POA: Diagnosis not present

## 2019-11-05 DIAGNOSIS — I471 Supraventricular tachycardia: Secondary | ICD-10-CM | POA: Diagnosis not present

## 2019-11-05 DIAGNOSIS — J449 Chronic obstructive pulmonary disease, unspecified: Secondary | ICD-10-CM | POA: Diagnosis not present

## 2019-11-05 DIAGNOSIS — C50412 Malignant neoplasm of upper-outer quadrant of left female breast: Secondary | ICD-10-CM | POA: Diagnosis not present

## 2019-11-05 DIAGNOSIS — Z7984 Long term (current) use of oral hypoglycemic drugs: Secondary | ICD-10-CM | POA: Diagnosis not present

## 2019-11-05 DIAGNOSIS — E7800 Pure hypercholesterolemia, unspecified: Secondary | ICD-10-CM | POA: Diagnosis not present

## 2019-11-05 DIAGNOSIS — E785 Hyperlipidemia, unspecified: Secondary | ICD-10-CM | POA: Diagnosis not present

## 2019-11-05 DIAGNOSIS — E1140 Type 2 diabetes mellitus with diabetic neuropathy, unspecified: Secondary | ICD-10-CM | POA: Diagnosis not present

## 2019-11-08 DIAGNOSIS — C7989 Secondary malignant neoplasm of other specified sites: Secondary | ICD-10-CM | POA: Diagnosis not present

## 2019-11-08 DIAGNOSIS — E1140 Type 2 diabetes mellitus with diabetic neuropathy, unspecified: Secondary | ICD-10-CM | POA: Diagnosis not present

## 2019-11-08 DIAGNOSIS — I10 Essential (primary) hypertension: Secondary | ICD-10-CM | POA: Diagnosis not present

## 2019-11-08 DIAGNOSIS — Z09 Encounter for follow-up examination after completed treatment for conditions other than malignant neoplasm: Secondary | ICD-10-CM | POA: Diagnosis not present

## 2019-11-08 DIAGNOSIS — K529 Noninfective gastroenteritis and colitis, unspecified: Secondary | ICD-10-CM | POA: Diagnosis not present

## 2019-11-08 DIAGNOSIS — E785 Hyperlipidemia, unspecified: Secondary | ICD-10-CM | POA: Diagnosis not present

## 2019-11-08 DIAGNOSIS — J449 Chronic obstructive pulmonary disease, unspecified: Secondary | ICD-10-CM | POA: Diagnosis not present

## 2019-11-08 DIAGNOSIS — I471 Supraventricular tachycardia: Secondary | ICD-10-CM | POA: Diagnosis not present

## 2019-11-08 DIAGNOSIS — C50912 Malignant neoplasm of unspecified site of left female breast: Secondary | ICD-10-CM | POA: Diagnosis not present

## 2019-11-08 DIAGNOSIS — C50412 Malignant neoplasm of upper-outer quadrant of left female breast: Secondary | ICD-10-CM | POA: Diagnosis not present

## 2019-11-08 DIAGNOSIS — Z7984 Long term (current) use of oral hypoglycemic drugs: Secondary | ICD-10-CM | POA: Diagnosis not present

## 2019-11-08 DIAGNOSIS — E7800 Pure hypercholesterolemia, unspecified: Secondary | ICD-10-CM | POA: Diagnosis not present

## 2019-11-09 DIAGNOSIS — E7800 Pure hypercholesterolemia, unspecified: Secondary | ICD-10-CM | POA: Diagnosis not present

## 2019-11-09 DIAGNOSIS — Z7984 Long term (current) use of oral hypoglycemic drugs: Secondary | ICD-10-CM | POA: Diagnosis not present

## 2019-11-09 DIAGNOSIS — C50412 Malignant neoplasm of upper-outer quadrant of left female breast: Secondary | ICD-10-CM | POA: Diagnosis not present

## 2019-11-09 DIAGNOSIS — E785 Hyperlipidemia, unspecified: Secondary | ICD-10-CM | POA: Diagnosis not present

## 2019-11-09 DIAGNOSIS — I10 Essential (primary) hypertension: Secondary | ICD-10-CM | POA: Diagnosis not present

## 2019-11-09 DIAGNOSIS — C7989 Secondary malignant neoplasm of other specified sites: Secondary | ICD-10-CM | POA: Diagnosis not present

## 2019-11-09 DIAGNOSIS — E1140 Type 2 diabetes mellitus with diabetic neuropathy, unspecified: Secondary | ICD-10-CM | POA: Diagnosis not present

## 2019-11-09 DIAGNOSIS — I471 Supraventricular tachycardia: Secondary | ICD-10-CM | POA: Diagnosis not present

## 2019-11-09 DIAGNOSIS — J449 Chronic obstructive pulmonary disease, unspecified: Secondary | ICD-10-CM | POA: Diagnosis not present

## 2019-11-10 DIAGNOSIS — C7989 Secondary malignant neoplasm of other specified sites: Secondary | ICD-10-CM | POA: Diagnosis not present

## 2019-11-10 DIAGNOSIS — E785 Hyperlipidemia, unspecified: Secondary | ICD-10-CM | POA: Diagnosis not present

## 2019-11-10 DIAGNOSIS — Z5111 Encounter for antineoplastic chemotherapy: Secondary | ICD-10-CM | POA: Diagnosis not present

## 2019-11-10 DIAGNOSIS — Z171 Estrogen receptor negative status [ER-]: Secondary | ICD-10-CM | POA: Diagnosis not present

## 2019-11-10 DIAGNOSIS — Z7984 Long term (current) use of oral hypoglycemic drugs: Secondary | ICD-10-CM | POA: Diagnosis not present

## 2019-11-10 DIAGNOSIS — E7800 Pure hypercholesterolemia, unspecified: Secondary | ICD-10-CM | POA: Diagnosis not present

## 2019-11-10 DIAGNOSIS — J449 Chronic obstructive pulmonary disease, unspecified: Secondary | ICD-10-CM | POA: Diagnosis not present

## 2019-11-10 DIAGNOSIS — E1140 Type 2 diabetes mellitus with diabetic neuropathy, unspecified: Secondary | ICD-10-CM | POA: Diagnosis not present

## 2019-11-10 DIAGNOSIS — Z95828 Presence of other vascular implants and grafts: Secondary | ICD-10-CM | POA: Diagnosis not present

## 2019-11-10 DIAGNOSIS — I471 Supraventricular tachycardia: Secondary | ICD-10-CM | POA: Diagnosis not present

## 2019-11-10 DIAGNOSIS — I10 Essential (primary) hypertension: Secondary | ICD-10-CM | POA: Diagnosis not present

## 2019-11-10 DIAGNOSIS — C50412 Malignant neoplasm of upper-outer quadrant of left female breast: Secondary | ICD-10-CM | POA: Diagnosis not present

## 2019-11-11 DIAGNOSIS — E785 Hyperlipidemia, unspecified: Secondary | ICD-10-CM | POA: Diagnosis not present

## 2019-11-11 DIAGNOSIS — E7800 Pure hypercholesterolemia, unspecified: Secondary | ICD-10-CM | POA: Diagnosis not present

## 2019-11-11 DIAGNOSIS — I10 Essential (primary) hypertension: Secondary | ICD-10-CM | POA: Diagnosis not present

## 2019-11-11 DIAGNOSIS — C50412 Malignant neoplasm of upper-outer quadrant of left female breast: Secondary | ICD-10-CM | POA: Diagnosis not present

## 2019-11-11 DIAGNOSIS — E1140 Type 2 diabetes mellitus with diabetic neuropathy, unspecified: Secondary | ICD-10-CM | POA: Diagnosis not present

## 2019-11-11 DIAGNOSIS — Z7984 Long term (current) use of oral hypoglycemic drugs: Secondary | ICD-10-CM | POA: Diagnosis not present

## 2019-11-11 DIAGNOSIS — J449 Chronic obstructive pulmonary disease, unspecified: Secondary | ICD-10-CM | POA: Diagnosis not present

## 2019-11-11 DIAGNOSIS — I471 Supraventricular tachycardia: Secondary | ICD-10-CM | POA: Diagnosis not present

## 2019-11-11 DIAGNOSIS — C7989 Secondary malignant neoplasm of other specified sites: Secondary | ICD-10-CM | POA: Diagnosis not present

## 2019-11-12 DIAGNOSIS — K521 Toxic gastroenteritis and colitis: Secondary | ICD-10-CM | POA: Diagnosis not present

## 2019-11-12 DIAGNOSIS — I10 Essential (primary) hypertension: Secondary | ICD-10-CM | POA: Diagnosis not present

## 2019-11-12 DIAGNOSIS — K219 Gastro-esophageal reflux disease without esophagitis: Secondary | ICD-10-CM | POA: Diagnosis not present

## 2019-11-12 DIAGNOSIS — E1165 Type 2 diabetes mellitus with hyperglycemia: Secondary | ICD-10-CM | POA: Diagnosis not present

## 2019-11-12 DIAGNOSIS — I471 Supraventricular tachycardia: Secondary | ICD-10-CM | POA: Diagnosis not present

## 2019-11-12 DIAGNOSIS — E782 Mixed hyperlipidemia: Secondary | ICD-10-CM | POA: Diagnosis not present

## 2019-11-16 DIAGNOSIS — E1122 Type 2 diabetes mellitus with diabetic chronic kidney disease: Secondary | ICD-10-CM | POA: Diagnosis not present

## 2019-11-16 DIAGNOSIS — I129 Hypertensive chronic kidney disease with stage 1 through stage 4 chronic kidney disease, or unspecified chronic kidney disease: Secondary | ICD-10-CM | POA: Diagnosis not present

## 2019-11-16 DIAGNOSIS — E782 Mixed hyperlipidemia: Secondary | ICD-10-CM | POA: Diagnosis not present

## 2019-11-16 DIAGNOSIS — K219 Gastro-esophageal reflux disease without esophagitis: Secondary | ICD-10-CM | POA: Diagnosis not present

## 2019-11-16 DIAGNOSIS — I10 Essential (primary) hypertension: Secondary | ICD-10-CM | POA: Diagnosis not present

## 2019-11-29 ENCOUNTER — Ambulatory Visit (INDEPENDENT_AMBULATORY_CARE_PROVIDER_SITE_OTHER): Payer: Medicare Other | Admitting: Gastroenterology

## 2019-11-29 DIAGNOSIS — Z171 Estrogen receptor negative status [ER-]: Secondary | ICD-10-CM | POA: Diagnosis not present

## 2019-11-29 DIAGNOSIS — C50912 Malignant neoplasm of unspecified site of left female breast: Secondary | ICD-10-CM | POA: Diagnosis not present

## 2019-11-29 DIAGNOSIS — B027 Disseminated zoster: Secondary | ICD-10-CM | POA: Diagnosis not present

## 2019-11-29 DIAGNOSIS — C50412 Malignant neoplasm of upper-outer quadrant of left female breast: Secondary | ICD-10-CM | POA: Diagnosis not present

## 2019-11-29 DIAGNOSIS — Z09 Encounter for follow-up examination after completed treatment for conditions other than malignant neoplasm: Secondary | ICD-10-CM | POA: Diagnosis not present

## 2019-11-29 DIAGNOSIS — C7989 Secondary malignant neoplasm of other specified sites: Secondary | ICD-10-CM | POA: Diagnosis not present

## 2019-12-01 DIAGNOSIS — Z171 Estrogen receptor negative status [ER-]: Secondary | ICD-10-CM | POA: Diagnosis not present

## 2019-12-01 DIAGNOSIS — C50412 Malignant neoplasm of upper-outer quadrant of left female breast: Secondary | ICD-10-CM | POA: Diagnosis not present

## 2019-12-01 DIAGNOSIS — Z5112 Encounter for antineoplastic immunotherapy: Secondary | ICD-10-CM | POA: Diagnosis not present

## 2019-12-06 DIAGNOSIS — R21 Rash and other nonspecific skin eruption: Secondary | ICD-10-CM | POA: Diagnosis not present

## 2019-12-10 DIAGNOSIS — E782 Mixed hyperlipidemia: Secondary | ICD-10-CM | POA: Diagnosis not present

## 2019-12-10 DIAGNOSIS — E1122 Type 2 diabetes mellitus with diabetic chronic kidney disease: Secondary | ICD-10-CM | POA: Diagnosis not present

## 2019-12-10 DIAGNOSIS — N189 Chronic kidney disease, unspecified: Secondary | ICD-10-CM | POA: Diagnosis not present

## 2019-12-10 DIAGNOSIS — I129 Hypertensive chronic kidney disease with stage 1 through stage 4 chronic kidney disease, or unspecified chronic kidney disease: Secondary | ICD-10-CM | POA: Diagnosis not present

## 2019-12-10 DIAGNOSIS — I471 Supraventricular tachycardia: Secondary | ICD-10-CM | POA: Diagnosis not present

## 2019-12-15 DIAGNOSIS — Z7984 Long term (current) use of oral hypoglycemic drugs: Secondary | ICD-10-CM | POA: Diagnosis not present

## 2019-12-15 DIAGNOSIS — E7800 Pure hypercholesterolemia, unspecified: Secondary | ICD-10-CM | POA: Diagnosis not present

## 2019-12-15 DIAGNOSIS — E1140 Type 2 diabetes mellitus with diabetic neuropathy, unspecified: Secondary | ICD-10-CM | POA: Diagnosis not present

## 2019-12-15 DIAGNOSIS — I471 Supraventricular tachycardia: Secondary | ICD-10-CM | POA: Diagnosis not present

## 2019-12-15 DIAGNOSIS — C50912 Malignant neoplasm of unspecified site of left female breast: Secondary | ICD-10-CM | POA: Diagnosis not present

## 2019-12-15 DIAGNOSIS — I10 Essential (primary) hypertension: Secondary | ICD-10-CM | POA: Diagnosis not present

## 2019-12-15 DIAGNOSIS — J449 Chronic obstructive pulmonary disease, unspecified: Secondary | ICD-10-CM | POA: Diagnosis not present

## 2019-12-15 DIAGNOSIS — C7989 Secondary malignant neoplasm of other specified sites: Secondary | ICD-10-CM | POA: Diagnosis not present

## 2019-12-15 DIAGNOSIS — E785 Hyperlipidemia, unspecified: Secondary | ICD-10-CM | POA: Diagnosis not present

## 2019-12-15 DIAGNOSIS — C50412 Malignant neoplasm of upper-outer quadrant of left female breast: Secondary | ICD-10-CM | POA: Diagnosis not present

## 2019-12-21 DIAGNOSIS — C7989 Secondary malignant neoplasm of other specified sites: Secondary | ICD-10-CM | POA: Diagnosis not present

## 2019-12-21 DIAGNOSIS — Z0189 Encounter for other specified special examinations: Secondary | ICD-10-CM | POA: Diagnosis not present

## 2019-12-21 DIAGNOSIS — R112 Nausea with vomiting, unspecified: Secondary | ICD-10-CM | POA: Diagnosis not present

## 2019-12-21 DIAGNOSIS — R222 Localized swelling, mass and lump, trunk: Secondary | ICD-10-CM | POA: Diagnosis not present

## 2019-12-21 DIAGNOSIS — T451X5A Adverse effect of antineoplastic and immunosuppressive drugs, initial encounter: Secondary | ICD-10-CM | POA: Diagnosis not present

## 2019-12-21 DIAGNOSIS — C50212 Malignant neoplasm of upper-inner quadrant of left female breast: Secondary | ICD-10-CM | POA: Diagnosis not present

## 2019-12-21 DIAGNOSIS — C50912 Malignant neoplasm of unspecified site of left female breast: Secondary | ICD-10-CM | POA: Diagnosis not present

## 2019-12-21 DIAGNOSIS — Z09 Encounter for follow-up examination after completed treatment for conditions other than malignant neoplasm: Secondary | ICD-10-CM | POA: Diagnosis not present

## 2019-12-21 DIAGNOSIS — K529 Noninfective gastroenteritis and colitis, unspecified: Secondary | ICD-10-CM | POA: Diagnosis not present

## 2019-12-22 DIAGNOSIS — Z5112 Encounter for antineoplastic immunotherapy: Secondary | ICD-10-CM | POA: Diagnosis not present

## 2019-12-22 DIAGNOSIS — Z171 Estrogen receptor negative status [ER-]: Secondary | ICD-10-CM | POA: Diagnosis not present

## 2019-12-22 DIAGNOSIS — Z95828 Presence of other vascular implants and grafts: Secondary | ICD-10-CM | POA: Diagnosis not present

## 2019-12-22 DIAGNOSIS — C50412 Malignant neoplasm of upper-outer quadrant of left female breast: Secondary | ICD-10-CM | POA: Diagnosis not present

## 2019-12-30 ENCOUNTER — Other Ambulatory Visit (HOSPITAL_COMMUNITY): Payer: Self-pay | Admitting: Oncology

## 2019-12-30 DIAGNOSIS — R222 Localized swelling, mass and lump, trunk: Secondary | ICD-10-CM

## 2019-12-30 DIAGNOSIS — C7981 Secondary malignant neoplasm of breast: Secondary | ICD-10-CM

## 2019-12-30 DIAGNOSIS — C50012 Malignant neoplasm of nipple and areola, left female breast: Secondary | ICD-10-CM

## 2020-01-03 DIAGNOSIS — I129 Hypertensive chronic kidney disease with stage 1 through stage 4 chronic kidney disease, or unspecified chronic kidney disease: Secondary | ICD-10-CM | POA: Diagnosis not present

## 2020-01-03 DIAGNOSIS — N189 Chronic kidney disease, unspecified: Secondary | ICD-10-CM | POA: Diagnosis not present

## 2020-01-03 DIAGNOSIS — I471 Supraventricular tachycardia: Secondary | ICD-10-CM | POA: Diagnosis not present

## 2020-01-03 DIAGNOSIS — E782 Mixed hyperlipidemia: Secondary | ICD-10-CM | POA: Diagnosis not present

## 2020-01-03 DIAGNOSIS — E1122 Type 2 diabetes mellitus with diabetic chronic kidney disease: Secondary | ICD-10-CM | POA: Diagnosis not present

## 2020-01-17 DIAGNOSIS — Z0189 Encounter for other specified special examinations: Secondary | ICD-10-CM | POA: Diagnosis not present

## 2020-01-17 DIAGNOSIS — C50912 Malignant neoplasm of unspecified site of left female breast: Secondary | ICD-10-CM | POA: Diagnosis not present

## 2020-01-17 DIAGNOSIS — C7989 Secondary malignant neoplasm of other specified sites: Secondary | ICD-10-CM | POA: Diagnosis not present

## 2020-01-17 DIAGNOSIS — R222 Localized swelling, mass and lump, trunk: Secondary | ICD-10-CM | POA: Diagnosis not present

## 2020-01-18 DIAGNOSIS — C801 Malignant (primary) neoplasm, unspecified: Secondary | ICD-10-CM | POA: Diagnosis not present

## 2020-01-18 DIAGNOSIS — I361 Nonrheumatic tricuspid (valve) insufficiency: Secondary | ICD-10-CM | POA: Diagnosis not present

## 2020-01-24 ENCOUNTER — Encounter (HOSPITAL_COMMUNITY)
Admission: RE | Admit: 2020-01-24 | Discharge: 2020-01-24 | Disposition: A | Discharge status: Home / Self Care | Payer: Medicare Other | Source: Ambulatory Visit | Attending: Oncology | Admitting: Oncology

## 2020-01-24 ENCOUNTER — Other Ambulatory Visit: Payer: Self-pay

## 2020-01-24 DIAGNOSIS — Z923 Personal history of irradiation: Secondary | ICD-10-CM

## 2020-01-24 DIAGNOSIS — R222 Localized swelling, mass and lump, trunk: Secondary | ICD-10-CM | POA: Insufficient documentation

## 2020-01-24 DIAGNOSIS — C50012 Malignant neoplasm of nipple and areola, left female breast: Secondary | ICD-10-CM | POA: Insufficient documentation

## 2020-01-24 DIAGNOSIS — C7981 Secondary malignant neoplasm of breast: Secondary | ICD-10-CM | POA: Diagnosis not present

## 2020-01-24 DIAGNOSIS — Z9012 Acquired absence of left breast and nipple: Secondary | ICD-10-CM

## 2020-01-24 DIAGNOSIS — Z9221 Personal history of antineoplastic chemotherapy: Secondary | ICD-10-CM | POA: Diagnosis not present

## 2020-01-24 DIAGNOSIS — C50912 Malignant neoplasm of unspecified site of left female breast: Secondary | ICD-10-CM | POA: Diagnosis not present

## 2020-01-24 MED ORDER — FLUDEOXYGLUCOSE F - 18 (FDG) INJECTION
13.0000 | Freq: Once | INTRAVENOUS | Status: AC | PRN
  Administered 2020-01-24: 13 via INTRAVENOUS

## 2020-01-31 DIAGNOSIS — Z780 Asymptomatic menopausal state: Secondary | ICD-10-CM | POA: Diagnosis not present

## 2020-01-31 DIAGNOSIS — E785 Hyperlipidemia, unspecified: Secondary | ICD-10-CM | POA: Diagnosis not present

## 2020-01-31 DIAGNOSIS — K219 Gastro-esophageal reflux disease without esophagitis: Secondary | ICD-10-CM | POA: Diagnosis not present

## 2020-01-31 DIAGNOSIS — M797 Fibromyalgia: Secondary | ICD-10-CM | POA: Diagnosis not present

## 2020-01-31 DIAGNOSIS — Z9049 Acquired absence of other specified parts of digestive tract: Secondary | ICD-10-CM | POA: Diagnosis not present

## 2020-01-31 DIAGNOSIS — Z9012 Acquired absence of left breast and nipple: Secondary | ICD-10-CM | POA: Diagnosis not present

## 2020-01-31 DIAGNOSIS — C50912 Malignant neoplasm of unspecified site of left female breast: Secondary | ICD-10-CM | POA: Diagnosis not present

## 2020-01-31 DIAGNOSIS — I10 Essential (primary) hypertension: Secondary | ICD-10-CM | POA: Diagnosis not present

## 2020-01-31 DIAGNOSIS — Z86718 Personal history of other venous thrombosis and embolism: Secondary | ICD-10-CM | POA: Diagnosis not present

## 2020-01-31 DIAGNOSIS — C7989 Secondary malignant neoplasm of other specified sites: Secondary | ICD-10-CM | POA: Diagnosis not present

## 2020-01-31 DIAGNOSIS — E7800 Pure hypercholesterolemia, unspecified: Secondary | ICD-10-CM | POA: Diagnosis not present

## 2020-01-31 DIAGNOSIS — I471 Supraventricular tachycardia: Secondary | ICD-10-CM | POA: Diagnosis not present

## 2020-01-31 DIAGNOSIS — M810 Age-related osteoporosis without current pathological fracture: Secondary | ICD-10-CM | POA: Diagnosis not present

## 2020-01-31 DIAGNOSIS — C50412 Malignant neoplasm of upper-outer quadrant of left female breast: Secondary | ICD-10-CM | POA: Diagnosis not present

## 2020-01-31 DIAGNOSIS — E1140 Type 2 diabetes mellitus with diabetic neuropathy, unspecified: Secondary | ICD-10-CM | POA: Diagnosis not present

## 2020-01-31 DIAGNOSIS — J449 Chronic obstructive pulmonary disease, unspecified: Secondary | ICD-10-CM | POA: Diagnosis not present

## 2020-01-31 DIAGNOSIS — Z923 Personal history of irradiation: Secondary | ICD-10-CM | POA: Diagnosis not present

## 2020-01-31 DIAGNOSIS — Z09 Encounter for follow-up examination after completed treatment for conditions other than malignant neoplasm: Secondary | ICD-10-CM | POA: Diagnosis not present

## 2020-01-31 DIAGNOSIS — Z171 Estrogen receptor negative status [ER-]: Secondary | ICD-10-CM | POA: Diagnosis not present

## 2020-01-31 DIAGNOSIS — K529 Noninfective gastroenteritis and colitis, unspecified: Secondary | ICD-10-CM | POA: Diagnosis not present

## 2020-01-31 DIAGNOSIS — M47819 Spondylosis without myelopathy or radiculopathy, site unspecified: Secondary | ICD-10-CM | POA: Diagnosis not present

## 2020-01-31 DIAGNOSIS — T50905S Adverse effect of unspecified drugs, medicaments and biological substances, sequela: Secondary | ICD-10-CM | POA: Diagnosis not present

## 2020-03-03 DIAGNOSIS — Z95828 Presence of other vascular implants and grafts: Secondary | ICD-10-CM | POA: Diagnosis not present

## 2020-03-03 DIAGNOSIS — C7989 Secondary malignant neoplasm of other specified sites: Secondary | ICD-10-CM | POA: Diagnosis not present

## 2020-03-03 DIAGNOSIS — C50912 Malignant neoplasm of unspecified site of left female breast: Secondary | ICD-10-CM | POA: Diagnosis not present

## 2020-03-08 IMAGING — DX DG LUMBAR SPINE COMPLETE 4+V
5 series · 5 of 5 positions shown · non-contrast
Comparison: Lumbar spine x-rays dated October 01, 2016.

CLINICAL DATA: Chronic low back pain radiating into the right hip
and leg.

EXAM:
LUMBAR SPINE - COMPLETE 4+ VIEW

[l-spine ap]
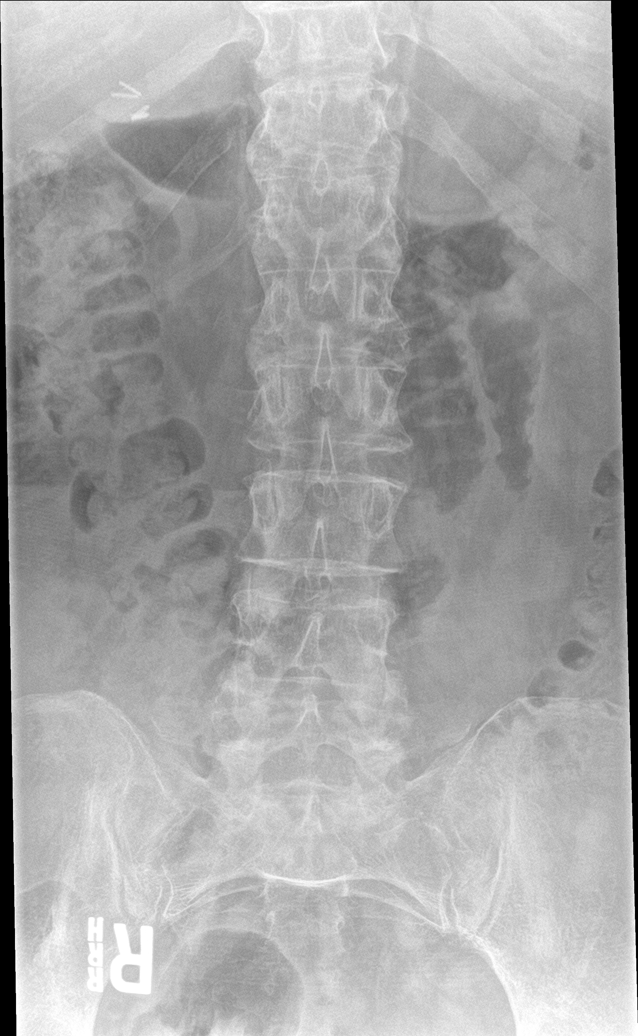

[l-spine obl (1 of 2)]
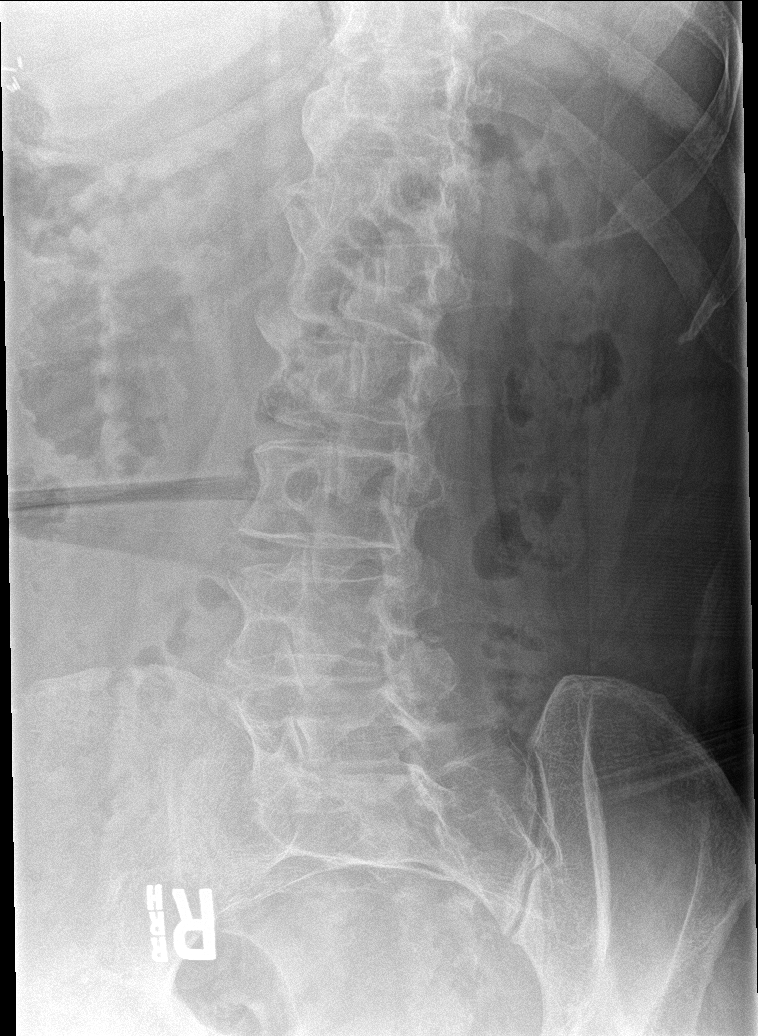

[l-spine obl (2 of 2)]
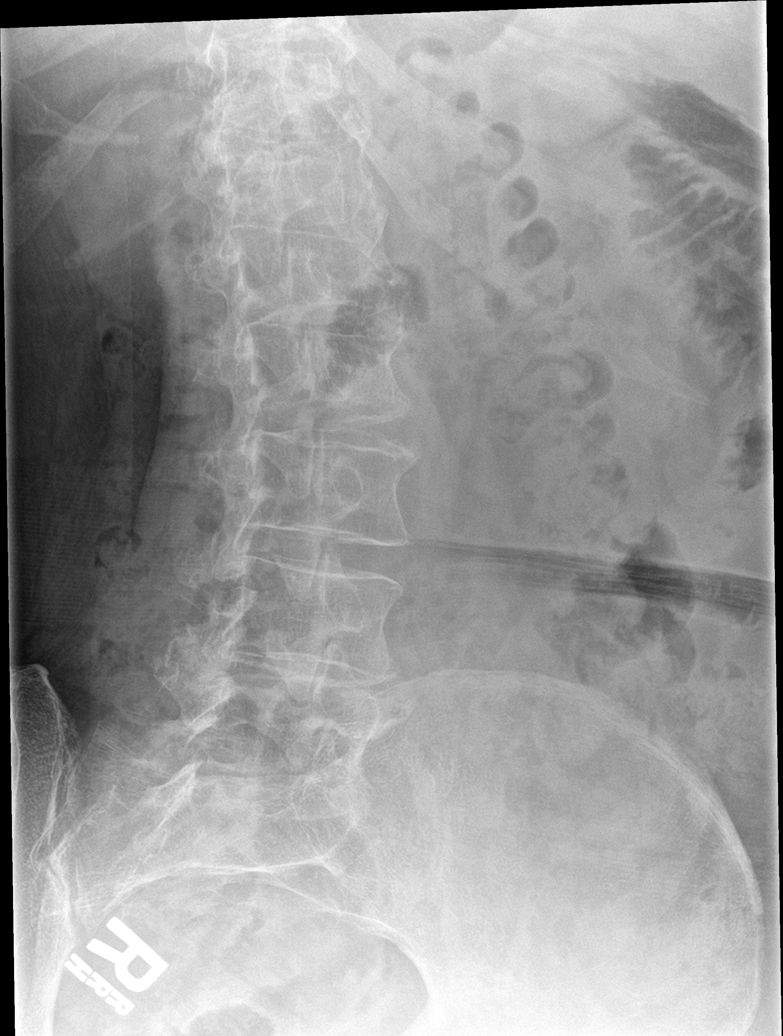

[l-spine lat]
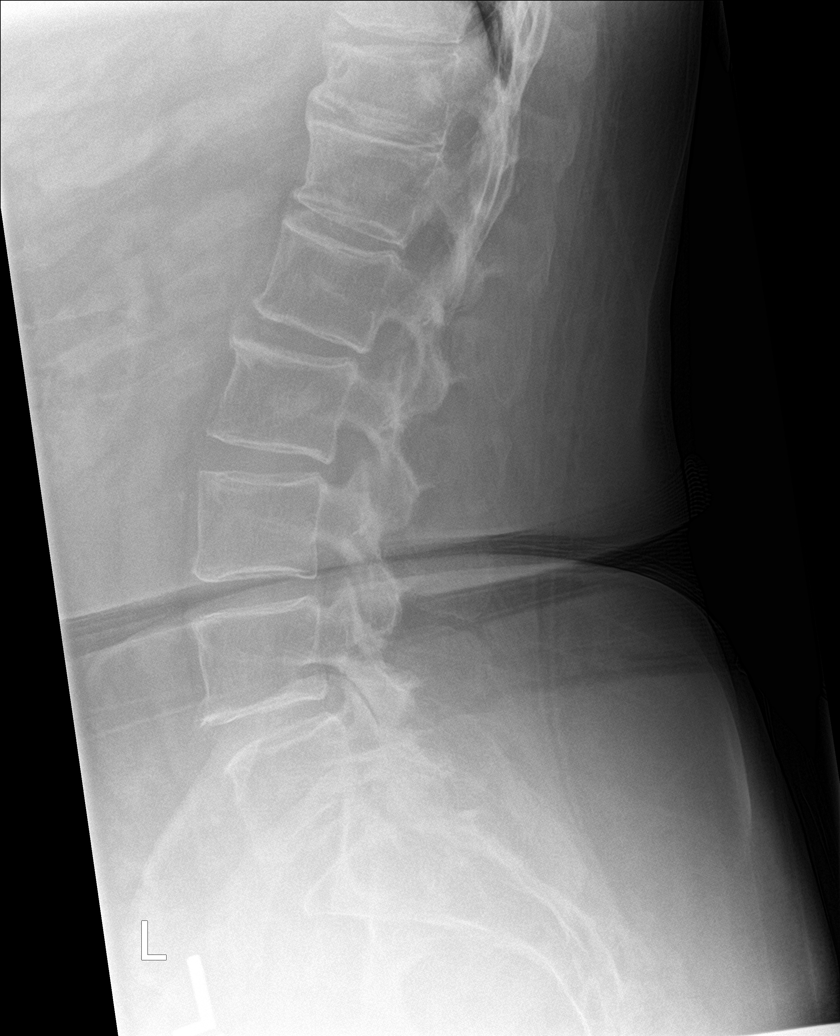

[l-spine spot]
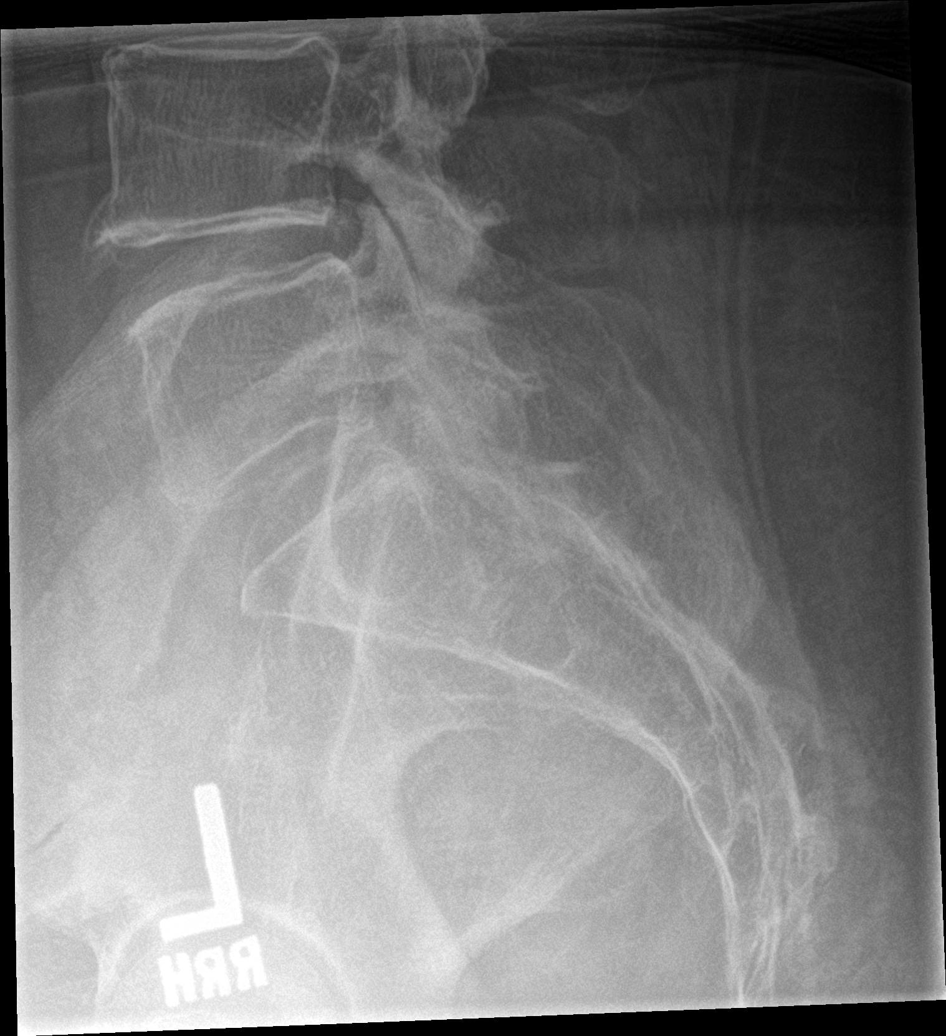

[5 of 5 positions shown; findings below may reference images not displayed]

FINDINGS: Five lumbar type vertebral bodies. No acute fracture or subluxation.
Unchanged chronic mild compression deformity of T11. Remaining
vertebral body heights are preserved. Alignment is normal. Mild disc
height loss at T11-T12 and T12-L1. Lumbar intervertebral disc spaces
are maintained. Mild-to-moderate facet arthropathy on the left at
L4-L5.
IMPRESSION: 1. No acute osseous abnormality or significant degenerative disc
disease in the lumbar spine. Mild to moderate lower lumbar facet
arthropathy.

## 2020-03-09 DIAGNOSIS — E1122 Type 2 diabetes mellitus with diabetic chronic kidney disease: Secondary | ICD-10-CM | POA: Diagnosis not present

## 2020-03-09 DIAGNOSIS — E782 Mixed hyperlipidemia: Secondary | ICD-10-CM | POA: Diagnosis not present

## 2020-03-09 DIAGNOSIS — E1165 Type 2 diabetes mellitus with hyperglycemia: Secondary | ICD-10-CM | POA: Diagnosis not present

## 2020-03-09 DIAGNOSIS — N1832 Chronic kidney disease, stage 3b: Secondary | ICD-10-CM | POA: Diagnosis not present

## 2020-03-09 DIAGNOSIS — C50912 Malignant neoplasm of unspecified site of left female breast: Secondary | ICD-10-CM | POA: Diagnosis not present

## 2020-03-14 DIAGNOSIS — E1165 Type 2 diabetes mellitus with hyperglycemia: Secondary | ICD-10-CM | POA: Diagnosis not present

## 2020-03-14 DIAGNOSIS — Z0001 Encounter for general adult medical examination with abnormal findings: Secondary | ICD-10-CM | POA: Diagnosis not present

## 2020-03-14 DIAGNOSIS — E1122 Type 2 diabetes mellitus with diabetic chronic kidney disease: Secondary | ICD-10-CM | POA: Diagnosis not present

## 2020-03-14 DIAGNOSIS — I129 Hypertensive chronic kidney disease with stage 1 through stage 4 chronic kidney disease, or unspecified chronic kidney disease: Secondary | ICD-10-CM | POA: Diagnosis not present

## 2020-03-14 DIAGNOSIS — G894 Chronic pain syndrome: Secondary | ICD-10-CM | POA: Diagnosis not present

## 2020-03-14 DIAGNOSIS — E782 Mixed hyperlipidemia: Secondary | ICD-10-CM | POA: Diagnosis not present

## 2020-03-14 DIAGNOSIS — K219 Gastro-esophageal reflux disease without esophagitis: Secondary | ICD-10-CM | POA: Diagnosis not present

## 2020-03-22 DIAGNOSIS — C50912 Malignant neoplasm of unspecified site of left female breast: Secondary | ICD-10-CM | POA: Diagnosis not present

## 2020-03-22 DIAGNOSIS — C7989 Secondary malignant neoplasm of other specified sites: Secondary | ICD-10-CM | POA: Diagnosis not present

## 2020-03-22 DIAGNOSIS — Z452 Encounter for adjustment and management of vascular access device: Secondary | ICD-10-CM | POA: Diagnosis not present

## 2020-04-25 DIAGNOSIS — C50412 Malignant neoplasm of upper-outer quadrant of left female breast: Secondary | ICD-10-CM | POA: Diagnosis not present

## 2020-04-25 DIAGNOSIS — Z95828 Presence of other vascular implants and grafts: Secondary | ICD-10-CM | POA: Diagnosis not present

## 2020-04-25 DIAGNOSIS — Z171 Estrogen receptor negative status [ER-]: Secondary | ICD-10-CM | POA: Diagnosis not present

## 2020-04-25 DIAGNOSIS — E559 Vitamin D deficiency, unspecified: Secondary | ICD-10-CM | POA: Diagnosis not present

## 2020-04-25 DIAGNOSIS — Z09 Encounter for follow-up examination after completed treatment for conditions other than malignant neoplasm: Secondary | ICD-10-CM | POA: Diagnosis not present

## 2020-05-02 DIAGNOSIS — I825Z1 Chronic embolism and thrombosis of unspecified deep veins of right distal lower extremity: Secondary | ICD-10-CM | POA: Diagnosis not present

## 2020-05-02 DIAGNOSIS — C50412 Malignant neoplasm of upper-outer quadrant of left female breast: Secondary | ICD-10-CM | POA: Diagnosis not present

## 2020-05-02 DIAGNOSIS — Z7901 Long term (current) use of anticoagulants: Secondary | ICD-10-CM | POA: Diagnosis not present

## 2020-05-02 DIAGNOSIS — E559 Vitamin D deficiency, unspecified: Secondary | ICD-10-CM | POA: Diagnosis not present

## 2020-05-02 DIAGNOSIS — C50912 Malignant neoplasm of unspecified site of left female breast: Secondary | ICD-10-CM | POA: Diagnosis not present

## 2020-05-02 DIAGNOSIS — T50905S Adverse effect of unspecified drugs, medicaments and biological substances, sequela: Secondary | ICD-10-CM | POA: Diagnosis not present

## 2020-07-13 DIAGNOSIS — E1165 Type 2 diabetes mellitus with hyperglycemia: Secondary | ICD-10-CM | POA: Diagnosis not present

## 2020-07-13 DIAGNOSIS — N189 Chronic kidney disease, unspecified: Secondary | ICD-10-CM | POA: Diagnosis not present

## 2020-07-13 DIAGNOSIS — I471 Supraventricular tachycardia: Secondary | ICD-10-CM | POA: Diagnosis not present

## 2020-07-13 DIAGNOSIS — I10 Essential (primary) hypertension: Secondary | ICD-10-CM | POA: Diagnosis not present

## 2020-07-13 DIAGNOSIS — J302 Other seasonal allergic rhinitis: Secondary | ICD-10-CM | POA: Diagnosis not present

## 2020-07-13 DIAGNOSIS — Z0001 Encounter for general adult medical examination with abnormal findings: Secondary | ICD-10-CM | POA: Diagnosis not present

## 2020-07-13 DIAGNOSIS — N1832 Chronic kidney disease, stage 3b: Secondary | ICD-10-CM | POA: Diagnosis not present

## 2020-07-18 DIAGNOSIS — E1122 Type 2 diabetes mellitus with diabetic chronic kidney disease: Secondary | ICD-10-CM | POA: Diagnosis not present

## 2020-07-18 DIAGNOSIS — K219 Gastro-esophageal reflux disease without esophagitis: Secondary | ICD-10-CM | POA: Diagnosis not present

## 2020-07-18 DIAGNOSIS — E782 Mixed hyperlipidemia: Secondary | ICD-10-CM | POA: Diagnosis not present

## 2020-07-18 DIAGNOSIS — I129 Hypertensive chronic kidney disease with stage 1 through stage 4 chronic kidney disease, or unspecified chronic kidney disease: Secondary | ICD-10-CM | POA: Diagnosis not present

## 2020-07-18 DIAGNOSIS — G894 Chronic pain syndrome: Secondary | ICD-10-CM | POA: Diagnosis not present

## 2020-07-20 DIAGNOSIS — Z171 Estrogen receptor negative status [ER-]: Secondary | ICD-10-CM | POA: Diagnosis not present

## 2020-07-20 DIAGNOSIS — E559 Vitamin D deficiency, unspecified: Secondary | ICD-10-CM | POA: Diagnosis not present

## 2020-07-20 DIAGNOSIS — Z09 Encounter for follow-up examination after completed treatment for conditions other than malignant neoplasm: Secondary | ICD-10-CM | POA: Diagnosis not present

## 2020-07-20 DIAGNOSIS — C50412 Malignant neoplasm of upper-outer quadrant of left female breast: Secondary | ICD-10-CM | POA: Diagnosis not present

## 2020-07-20 DIAGNOSIS — Z95828 Presence of other vascular implants and grafts: Secondary | ICD-10-CM | POA: Diagnosis not present

## 2020-07-20 DIAGNOSIS — I825Z1 Chronic embolism and thrombosis of unspecified deep veins of right distal lower extremity: Secondary | ICD-10-CM | POA: Diagnosis not present

## 2020-07-21 DIAGNOSIS — K521 Toxic gastroenteritis and colitis: Secondary | ICD-10-CM | POA: Diagnosis not present

## 2020-07-24 DIAGNOSIS — Z171 Estrogen receptor negative status [ER-]: Secondary | ICD-10-CM | POA: Diagnosis not present

## 2020-07-24 DIAGNOSIS — C50412 Malignant neoplasm of upper-outer quadrant of left female breast: Secondary | ICD-10-CM | POA: Diagnosis not present

## 2020-07-26 DIAGNOSIS — K521 Toxic gastroenteritis and colitis: Secondary | ICD-10-CM | POA: Diagnosis not present

## 2020-07-26 DIAGNOSIS — Z9012 Acquired absence of left breast and nipple: Secondary | ICD-10-CM | POA: Diagnosis not present

## 2020-07-26 DIAGNOSIS — J449 Chronic obstructive pulmonary disease, unspecified: Secondary | ICD-10-CM | POA: Diagnosis not present

## 2020-07-26 DIAGNOSIS — Z171 Estrogen receptor negative status [ER-]: Secondary | ICD-10-CM | POA: Diagnosis not present

## 2020-07-26 DIAGNOSIS — Z7901 Long term (current) use of anticoagulants: Secondary | ICD-10-CM | POA: Diagnosis not present

## 2020-07-26 DIAGNOSIS — K529 Noninfective gastroenteritis and colitis, unspecified: Secondary | ICD-10-CM | POA: Diagnosis not present

## 2020-07-26 DIAGNOSIS — E559 Vitamin D deficiency, unspecified: Secondary | ICD-10-CM | POA: Diagnosis not present

## 2020-07-26 DIAGNOSIS — M810 Age-related osteoporosis without current pathological fracture: Secondary | ICD-10-CM | POA: Diagnosis not present

## 2020-07-26 DIAGNOSIS — F17220 Nicotine dependence, chewing tobacco, uncomplicated: Secondary | ICD-10-CM | POA: Diagnosis not present

## 2020-07-26 DIAGNOSIS — C50412 Malignant neoplasm of upper-outer quadrant of left female breast: Secondary | ICD-10-CM | POA: Diagnosis not present

## 2020-07-26 DIAGNOSIS — C7989 Secondary malignant neoplasm of other specified sites: Secondary | ICD-10-CM | POA: Diagnosis not present

## 2020-07-26 DIAGNOSIS — C50912 Malignant neoplasm of unspecified site of left female breast: Secondary | ICD-10-CM | POA: Diagnosis not present

## 2020-07-26 DIAGNOSIS — E878 Other disorders of electrolyte and fluid balance, not elsewhere classified: Secondary | ICD-10-CM | POA: Diagnosis not present

## 2020-07-26 DIAGNOSIS — I10 Essential (primary) hypertension: Secondary | ICD-10-CM | POA: Diagnosis not present

## 2020-07-26 DIAGNOSIS — I471 Supraventricular tachycardia: Secondary | ICD-10-CM | POA: Diagnosis not present

## 2020-07-26 DIAGNOSIS — Z853 Personal history of malignant neoplasm of breast: Secondary | ICD-10-CM | POA: Diagnosis not present

## 2020-07-26 DIAGNOSIS — M47819 Spondylosis without myelopathy or radiculopathy, site unspecified: Secondary | ICD-10-CM | POA: Diagnosis not present

## 2020-07-26 DIAGNOSIS — E1140 Type 2 diabetes mellitus with diabetic neuropathy, unspecified: Secondary | ICD-10-CM | POA: Diagnosis not present

## 2020-07-26 DIAGNOSIS — M797 Fibromyalgia: Secondary | ICD-10-CM | POA: Diagnosis not present

## 2020-07-26 DIAGNOSIS — E7800 Pure hypercholesterolemia, unspecified: Secondary | ICD-10-CM | POA: Diagnosis not present

## 2020-07-26 DIAGNOSIS — Z923 Personal history of irradiation: Secondary | ICD-10-CM | POA: Diagnosis not present

## 2020-07-26 DIAGNOSIS — Z79891 Long term (current) use of opiate analgesic: Secondary | ICD-10-CM | POA: Diagnosis not present

## 2020-07-26 DIAGNOSIS — Z9221 Personal history of antineoplastic chemotherapy: Secondary | ICD-10-CM | POA: Diagnosis not present

## 2020-07-26 DIAGNOSIS — Z86718 Personal history of other venous thrombosis and embolism: Secondary | ICD-10-CM | POA: Diagnosis not present

## 2020-07-26 DIAGNOSIS — Z882 Allergy status to sulfonamides status: Secondary | ICD-10-CM | POA: Diagnosis not present

## 2020-07-26 DIAGNOSIS — K219 Gastro-esophageal reflux disease without esophagitis: Secondary | ICD-10-CM | POA: Diagnosis not present

## 2020-08-02 DIAGNOSIS — C50912 Malignant neoplasm of unspecified site of left female breast: Secondary | ICD-10-CM | POA: Diagnosis not present

## 2020-08-02 DIAGNOSIS — C7989 Secondary malignant neoplasm of other specified sites: Secondary | ICD-10-CM | POA: Diagnosis not present

## 2020-08-02 DIAGNOSIS — Z09 Encounter for follow-up examination after completed treatment for conditions other than malignant neoplasm: Secondary | ICD-10-CM | POA: Diagnosis not present

## 2020-08-23 DIAGNOSIS — Z1231 Encounter for screening mammogram for malignant neoplasm of breast: Secondary | ICD-10-CM | POA: Diagnosis not present

## 2020-08-29 DIAGNOSIS — C7989 Secondary malignant neoplasm of other specified sites: Secondary | ICD-10-CM | POA: Diagnosis not present

## 2020-08-29 DIAGNOSIS — Z95828 Presence of other vascular implants and grafts: Secondary | ICD-10-CM | POA: Diagnosis not present

## 2020-08-29 DIAGNOSIS — C50912 Malignant neoplasm of unspecified site of left female breast: Secondary | ICD-10-CM | POA: Diagnosis not present

## 2020-09-07 DIAGNOSIS — E11319 Type 2 diabetes mellitus with unspecified diabetic retinopathy without macular edema: Secondary | ICD-10-CM | POA: Diagnosis not present

## 2020-10-05 ENCOUNTER — Encounter: Payer: Self-pay | Admitting: Cardiology

## 2020-10-05 ENCOUNTER — Ambulatory Visit: Payer: Medicare Other | Admitting: Cardiology

## 2020-10-05 ENCOUNTER — Other Ambulatory Visit: Payer: Self-pay

## 2020-10-05 VITALS — BP 128/82 | HR 95 | Ht 67.5 in | Wt 275.8 lb

## 2020-10-05 DIAGNOSIS — I10 Essential (primary) hypertension: Secondary | ICD-10-CM

## 2020-10-05 DIAGNOSIS — E785 Hyperlipidemia, unspecified: Secondary | ICD-10-CM

## 2020-10-05 DIAGNOSIS — I824Z1 Acute embolism and thrombosis of unspecified deep veins of right distal lower extremity: Secondary | ICD-10-CM

## 2020-10-05 DIAGNOSIS — I471 Supraventricular tachycardia: Secondary | ICD-10-CM | POA: Diagnosis not present

## 2020-10-05 DIAGNOSIS — E119 Type 2 diabetes mellitus without complications: Secondary | ICD-10-CM | POA: Diagnosis not present

## 2020-10-05 NOTE — Progress Notes (Signed)
Cardiology Office Note   Date:  10/05/2020   ID:  Kelli Wilson, DOB [birth year], MRN [id number]  PCP:  Celene Squibb, MD  Cardiologist:  Was Dr. Bronson Ing    Chief Complaint  Patient presents with  . Tachycardia      History of Present Illness: Kelli Wilson is a 69 y.o. female who presents for SVT -hx of    Last OV 09/10/19  diagnosed with SVT in May 2019. It appeared to be a long RP tachycardia and likely an atrial tachycardia.  Past medical history includes triple negative high-grade invasive ductal carcinoma of the left breasttreated with Cytoxan and Adriamycin. She also has type 2 diabetes mellitus, diffuse arthritis and fibromyalgia.  She underwent a normal coronary angiogram on 04/26/2004. She subsequently underwent a normal nuclear stress test on 03/03/2010.  The patient wore an event monitor for a brief period of time which showed sinus rhythm.  Echocardiogram on 04/24/2018 showed normal left ventricular systolic function and regional wall motion, LVEF 55 to 60%, mild LVH, and grade 1 diastolic dysfunction.  She also complained of episodes of near syncope on 11/13/2018.  No further episodes.   She had been having problems with orthostatic hypotension over the last several months.  She has some exertional dyspnea as well.  Echocardiogram dated 07/30/2019 which showed LVEF greater than 55%.  She follows with oncology at [facility] in [place].  I reviewed labs dated 09/06/2019: Hemoglobin 10.5, phosphorus 2.3, BUN 18, creatinine 1.47.  When she stands up her blood pressure will drop to 90/60.  Her oncologist prescribed a walker.  She has had issues with chronic diarrhea.  The most water she can drink in a day is two 16 ounce bottles but then she develops diarrhea.  She has been referred to GI by oncology.  I reviewed oncology notes.  She developed right leg DVT in April 2020 and takes Xarelto. Now down to 10 mg daily.    Her last chemo and radiation  11/2019 was very weak still with walker until 3 months ago and now stronger, though episodes of weakness at times.  Her BB is up to 100 mg daily.  HR still in the 90s.  SR.  No chest pain, though she does have chest wall pain from scar tissue.  No SOB.  Echo 12/2019 was stable.      Past Medical History:  Diagnosis Date  . Anxiety   . Arthritis   . Back pain   . Byssinosis ([place])    secondary to working in a [employer]  . Chest pain   . Chronic pain   . Depression   . Diabetes mellitus without complication ([place])   . Fibromyalgia   . GERD (gastroesophageal reflux disease)   . Hypercholesteremia   . Hypertension   . Hypokalemia   . Hypomagnesemia   . Knee pain   . Obesity   . Peptic ulcer    history of - normal GI studies Sept 2010  . PSVT (paroxysmal supraventricular tachycardia) ([place])    a. dx 03/2018 - long RP likely atrial tachycardia.    Past Surgical History:  Procedure Laterality Date  . APPENDECTOMY    . bladder rectal repair    . BREAST SURGERY Left    breast biopsy- benign  . CHOLECYSTECTOMY    . DILATION AND CURETTAGE OF UTERUS    . GANGLION CYST EXCISION Left   . MASTECTOMY MODIFIED RADICAL Left 09/15/2017   Procedure: LEFT MODIFIED RADICAL MASTECTOMY;  Surgeon: Aviva Signs, MD;  Location: [facility];  Service: General;  Laterality: Left;  . PORTACATH PLACEMENT Right 10/13/2017   Procedure: INSERTION PORT-A-CATH RIGHT SUBCLAVIAN;  Surgeon: Aviva Signs, MD;  Location: [facility];  Service: General;  Laterality: Right;  . TOTAL ABDOMINAL HYSTERECTOMY       Current Outpatient Medications  Medication Sig Dispense Refill  . ALPRAZolam (XANAX) 0.5 MG tablet Take 0.5 mg by mouth 4 (four) times daily as needed for anxiety.     Marland Kitchen FARXIGA 5 MG TABS tablet Take 5 mg by mouth daily.    Marland Kitchen HYDROcodone-acetaminophen (NORCO) 7.5-325 MG tablet TAKE ONE TABLET BY MOUTH FOUR TIMES DAILY AS NEEDED FOR PAIN.    . hydrOXYzine (ATARAX/VISTARIL) 50 MG tablet Take 50 mg by mouth every  other day. Every other day at bedtime and as needed    . magnesium oxide (MAG-OX) 400 (241.3 Mg) MG tablet Take 1 tablet (400 mg total) by mouth 3 (three) times daily between meals. (Patient taking differently: Take 400 mg by mouth daily. ) 90 tablet 6  . metFORMIN (GLUCOPHAGE) 500 MG tablet Take 500 mg by mouth 2 (two) times daily with a meal.     . metoprolol succinate (TOPROL-XL) 100 MG 24 hr tablet Take 1 tablet (100 mg total) by mouth daily. Take with or immediately following a meal. 30 tablet 6  . nitroGLYCERIN (NITROSTAT) 0.4 MG SL tablet Place 0.4 mg under the tongue every 5 (five) minutes as needed for chest pain.    Marland Kitchen omeprazole (PRILOSEC) 40 MG capsule Take 40 mg every other day by mouth. At night    . pravastatin (PRAVACHOL) 40 MG tablet Take 40 mg by mouth daily.    . rivaroxaban (XARELTO) 10 MG TABS tablet Take 1 tablet by mouth daily.     No current facility-administered medications for this visit.    Allergies:   Magnesium sulfate, Other, Cortisone, Iohexol, Ivp dye [iodinated diagnostic agents], Januvia [sitagliptin], Latex, Motrin [ibuprofen], Nsaids, Statins, Sulfa antibiotics, and Adhesive [tape]    Social History:  The patient  reports that she has never smoked. Her smokeless tobacco use includes snuff. She reports that she does not drink alcohol and does not use drugs.   Family History:  The patient's family history includes Aneurysm in her father; COPD in her mother; Diabetes in her mother; Heart disease in her father.    ROS:  General:no colds or fevers, no weight changes Skin:no rashes or ulcers HEENT:no blurred vision, no congestion CV:see HPI PUL:see HPI GI:no diarrhea constipation or melena, no indigestion GU:no hematuria, no dysuria MS:no joint pain, no claudication Neuro:no syncope, no lightheadedness Endo:+ diabetes stable, no thyroid disease  Wt Readings from Last 3 Encounters:  10/05/20 275 lb 12.8 oz (125.1 kg)  09/10/19 262 lb (118.8 kg)  12/09/18  277 lb (125.6 kg)     PHYSICAL EXAM: VS:  BP 128/82   Pulse 95   Ht 5' 7.5" (1.715 m)   Wt 275 lb 12.8 oz (125.1 kg)   SpO2 95%   BMI 42.56 kg/m  , BMI Body mass index is 42.56 kg/m. General:Pleasant affect, NAD Skin:Warm and dry, brisk capillary refill HEENT:normocephalic, sclera clear, mucus membranes moist Neck:supple, no JVD, no bruits  Heart:S1S2 RRR without murmur, gallup, rub or click Lungs:clear without rales, rhonchi, or wheezes WUJ:WJXB, non tender, + BS, do not palpate liver spleen or masses Ext:tr lower ext edema, 2+ pedal pulses, 2+ radial pulses Neuro:alert and oriented X 3, MAE, follows commands, +  facial symmetry    EKG:  EKG is ordered today. The ekg ordered today demonstrates SR at 94, normal EKG    Recent Labs: No results found for requested labs within last 8760 hours.    Lipid Panel    Component Value Date/Time   CHOL 166 02/16/2018 1122   TRIG 196 (H) 02/16/2018 1122   HDL 50 02/16/2018 1122   CHOLHDL 3.3 02/16/2018 1122   VLDL 39 02/16/2018 1122   LDLCALC 77 02/16/2018 1122       Other studies Reviewed: Additional studies/ records that were reviewed today include: . TTE 12/2019 Summary 1. Technically difficult study due to chest wall/lung interference and body habitus. 2. Echo contrast utilized to enhance endocardial border definition. 3. The left ventricle is normal in size with mildly increased wall thickness. 4. The left ventricular systolic function is normal, LVEF is visually estimated at > 55%. 5. The right ventricle is normal in size, with normal systolic function.   Left Ventricle The left ventricle is normal in size with mildly increased wall thickness. The left ventricular systolic function is normal, LVEF is visually estimated at > 55%. There is normal left ventricular diastolic function. Images suboptimal for strain.  Right Ventricle The right ventricle is normal in size, with normal systolic  function.   Left Atrium The left atrium is normal in size.  Right Atrium The right atrium is normal in size.   Aortic Valve The aortic valve is trileaflet with normal appearing leaflets with normal excursion. There is no significant aortic regurgitation. There is no evidence of a significant transvalvular gradient.  Pulmonic Valve The pulmonic valve is normal. There is no significant pulmonic regurgitation. There is no evidence of a significant transvalvular gradient.  Mitral Valve The mitral valve leaflets are normal with normal leaflet mobility. There is no significant mitral valve regurgitation.  Tricuspid Valve The tricuspid valve leaflets are normal, with normal leaflet mobility. There is mild tricuspid regurgitation. There is no pulmonary hypertension, estimated pulmonary artery systolic pressure is 29 mmHg.   Other Findings Rhythm: Sinus Rhythm.  Pericardium/Pleural There is no pericardial effusion.  Inferior Vena Cava IVC appears normal size.  Aorta The aorta is normal in size in the visualized segments.   Left Ventricular Outflow Tract ---------------------------------------------------------------------- Name                 Value    Normal ----------------------------------------------------------------------  LVOT 2D ---------------------------------------------------------------------- LVOT Diameter            2.1 cm         LVOT Area             3.5 cm2          LVOT Doppler ---------------------------------------------------------------------- LVOT Peak Velocity         0.8 m/s         LVOT VTI               17 cm         LVOT Stroke Volume          60 ml  Pulmonic Valve ---------------------------------------------------------------------- Name                 Value     Normal ----------------------------------------------------------------------  PV Doppler ---------------------------------------------------------------------- PV Peak Velocity          0.9 m/s  Mitral Valve ---------------------------------------------------------------------- Name                 Value    Normal ----------------------------------------------------------------------  MV Diastolic Function ---------------------------------------------------------------------- MV E Peak Velocity         75 cm/s         MV A Peak Velocity        117 cm/s         MV E/A                 0.6          MV Annular TDI ---------------------------------------------------------------------- MV Septal e' Velocity       8.8 cm/s     >=8.0  MV Lateral e' Velocity      9.4 cm/s    >=10.0  MV e' Average             9.1         MV E/e' (Average)           8.3  Tricuspid Valve ---------------------------------------------------------------------- Name                 Value    Normal ----------------------------------------------------------------------  TV Regurgitation Doppler ---------------------------------------------------------------------- TR Peak Velocity           2 m/s          Estimated PAP/RSVP ---------------------------------------------------------------------- RA Pressure             5 mmHg      <=5  RV Systolic Pressure        29 mmHg      <36  Aorta ---------------------------------------------------------------------- Name                 Value    Normal ----------------------------------------------------------------------  Ascending Aorta ---------------------------------------------------------------------- Sinus of Valsalva Diameter     3.1 cm     2.7-3.3  Sinus of Valsalva Index     1.3 cm/m2    1.6-2.0  Asc Ao Diameter           3.1 cm          Thoracic Aorta ---------------------------------------------------------------------- Ao Arch Diameter          3.6 cm  Venous ---------------------------------------------------------------------- Name                 Value    Normal ----------------------------------------------------------------------   ASSESSMENT AND PLAN:  1.  SVT no further episodes on BB. Now on 100 mg daily.  Feels better  2.  HLD on pravastatin followed by PCP continue  3.  HTN controlled continue meds  4.  DM-2 per PCP and doing well  5. Rt leg DVT last year >12 months ago now on 10 mg xarelto and no bleeding issues.    6.  No further orthostatic hypotension now chemo and radiation are over.    Follow up in 1 year and as needed.    Current medicines are reviewed with the patient today.  The patient Has no concerns regarding medicines.  The following changes have been made:  See above Labs/ tests ordered today include:see above  Disposition:   FU:  see above  Signed, Cecilie Kicks, NP  10/05/2020 1:45 PM    [facility] [place], [place], [place], [place] Phone: [phone]; Fax: [phone]

## 2020-10-05 NOTE — Patient Instructions (Addendum)
Medication Instructions:  Office will contact with results via phone or letter.    Labwork: none  Testing/Procedures: none  Follow-Up: Your physician wants you to follow up in:  1 year.  You will receive a reminder letter in the mail one-two months in advance.  If you don't receive a letter, please call our office to schedule the follow up appointment.    Any Other Special Instructions Will Be Listed Below (If Applicable).  If you need a refill on your cardiac medications before your next appointment, please call your pharmacy.

## 2020-10-23 DIAGNOSIS — C50412 Malignant neoplasm of upper-outer quadrant of left female breast: Secondary | ICD-10-CM | POA: Diagnosis not present

## 2020-10-23 DIAGNOSIS — C7989 Secondary malignant neoplasm of other specified sites: Secondary | ICD-10-CM | POA: Diagnosis not present

## 2020-10-23 DIAGNOSIS — E559 Vitamin D deficiency, unspecified: Secondary | ICD-10-CM | POA: Diagnosis not present

## 2020-10-23 DIAGNOSIS — C50912 Malignant neoplasm of unspecified site of left female breast: Secondary | ICD-10-CM | POA: Diagnosis not present

## 2020-10-23 DIAGNOSIS — Z09 Encounter for follow-up examination after completed treatment for conditions other than malignant neoplasm: Secondary | ICD-10-CM | POA: Diagnosis not present

## 2020-10-25 DIAGNOSIS — Z2821 Immunization not carried out because of patient refusal: Secondary | ICD-10-CM | POA: Diagnosis not present

## 2020-10-25 DIAGNOSIS — C7989 Secondary malignant neoplasm of other specified sites: Secondary | ICD-10-CM | POA: Diagnosis not present

## 2020-10-25 DIAGNOSIS — C50412 Malignant neoplasm of upper-outer quadrant of left female breast: Secondary | ICD-10-CM | POA: Diagnosis not present

## 2020-10-25 DIAGNOSIS — Z7901 Long term (current) use of anticoagulants: Secondary | ICD-10-CM | POA: Diagnosis not present

## 2020-10-25 DIAGNOSIS — I825Z1 Chronic embolism and thrombosis of unspecified deep veins of right distal lower extremity: Secondary | ICD-10-CM | POA: Diagnosis not present

## 2020-10-25 DIAGNOSIS — C50912 Malignant neoplasm of unspecified site of left female breast: Secondary | ICD-10-CM | POA: Diagnosis not present

## 2020-10-25 DIAGNOSIS — E559 Vitamin D deficiency, unspecified: Secondary | ICD-10-CM | POA: Diagnosis not present

## 2020-10-25 DIAGNOSIS — Z171 Estrogen receptor negative status [ER-]: Secondary | ICD-10-CM | POA: Diagnosis not present

## 2020-11-09 DIAGNOSIS — Z0001 Encounter for general adult medical examination with abnormal findings: Secondary | ICD-10-CM | POA: Diagnosis not present

## 2020-11-09 DIAGNOSIS — N189 Chronic kidney disease, unspecified: Secondary | ICD-10-CM | POA: Diagnosis not present

## 2020-11-09 DIAGNOSIS — J302 Other seasonal allergic rhinitis: Secondary | ICD-10-CM | POA: Diagnosis not present

## 2020-11-09 DIAGNOSIS — E1165 Type 2 diabetes mellitus with hyperglycemia: Secondary | ICD-10-CM | POA: Diagnosis not present

## 2020-11-09 DIAGNOSIS — I471 Supraventricular tachycardia: Secondary | ICD-10-CM | POA: Diagnosis not present

## 2020-11-14 DIAGNOSIS — K219 Gastro-esophageal reflux disease without esophagitis: Secondary | ICD-10-CM | POA: Diagnosis not present

## 2020-11-14 DIAGNOSIS — E1122 Type 2 diabetes mellitus with diabetic chronic kidney disease: Secondary | ICD-10-CM | POA: Diagnosis not present

## 2020-11-14 DIAGNOSIS — I129 Hypertensive chronic kidney disease with stage 1 through stage 4 chronic kidney disease, or unspecified chronic kidney disease: Secondary | ICD-10-CM | POA: Diagnosis not present

## 2020-11-14 DIAGNOSIS — E782 Mixed hyperlipidemia: Secondary | ICD-10-CM | POA: Diagnosis not present

## 2020-11-14 DIAGNOSIS — G894 Chronic pain syndrome: Secondary | ICD-10-CM | POA: Diagnosis not present

## 2020-11-29 DIAGNOSIS — Z882 Allergy status to sulfonamides status: Secondary | ICD-10-CM | POA: Diagnosis not present

## 2020-11-29 DIAGNOSIS — K521 Toxic gastroenteritis and colitis: Secondary | ICD-10-CM | POA: Diagnosis not present

## 2020-11-29 DIAGNOSIS — Z9012 Acquired absence of left breast and nipple: Secondary | ICD-10-CM | POA: Diagnosis not present

## 2020-11-29 DIAGNOSIS — Z79891 Long term (current) use of opiate analgesic: Secondary | ICD-10-CM | POA: Diagnosis not present

## 2020-11-29 DIAGNOSIS — Z853 Personal history of malignant neoplasm of breast: Secondary | ICD-10-CM | POA: Diagnosis not present

## 2020-11-29 DIAGNOSIS — M810 Age-related osteoporosis without current pathological fracture: Secondary | ICD-10-CM | POA: Diagnosis not present

## 2020-11-29 DIAGNOSIS — E559 Vitamin D deficiency, unspecified: Secondary | ICD-10-CM | POA: Diagnosis not present

## 2020-11-29 DIAGNOSIS — C7989 Secondary malignant neoplasm of other specified sites: Secondary | ICD-10-CM | POA: Diagnosis not present

## 2020-11-29 DIAGNOSIS — F17220 Nicotine dependence, chewing tobacco, uncomplicated: Secondary | ICD-10-CM | POA: Diagnosis not present

## 2020-11-29 DIAGNOSIS — J449 Chronic obstructive pulmonary disease, unspecified: Secondary | ICD-10-CM | POA: Diagnosis not present

## 2020-11-29 DIAGNOSIS — Z7901 Long term (current) use of anticoagulants: Secondary | ICD-10-CM | POA: Diagnosis not present

## 2020-11-29 DIAGNOSIS — M797 Fibromyalgia: Secondary | ICD-10-CM | POA: Diagnosis not present

## 2020-11-29 DIAGNOSIS — E878 Other disorders of electrolyte and fluid balance, not elsewhere classified: Secondary | ICD-10-CM | POA: Diagnosis not present

## 2020-11-29 DIAGNOSIS — Z9221 Personal history of antineoplastic chemotherapy: Secondary | ICD-10-CM | POA: Diagnosis not present

## 2020-11-29 DIAGNOSIS — M47819 Spondylosis without myelopathy or radiculopathy, site unspecified: Secondary | ICD-10-CM | POA: Diagnosis not present

## 2020-11-29 DIAGNOSIS — E1140 Type 2 diabetes mellitus with diabetic neuropathy, unspecified: Secondary | ICD-10-CM | POA: Diagnosis not present

## 2020-11-29 DIAGNOSIS — I471 Supraventricular tachycardia: Secondary | ICD-10-CM | POA: Diagnosis not present

## 2020-11-29 DIAGNOSIS — Z923 Personal history of irradiation: Secondary | ICD-10-CM | POA: Diagnosis not present

## 2020-11-29 DIAGNOSIS — C50912 Malignant neoplasm of unspecified site of left female breast: Secondary | ICD-10-CM | POA: Diagnosis not present

## 2020-11-29 DIAGNOSIS — K219 Gastro-esophageal reflux disease without esophagitis: Secondary | ICD-10-CM | POA: Diagnosis not present

## 2020-11-29 DIAGNOSIS — E7800 Pure hypercholesterolemia, unspecified: Secondary | ICD-10-CM | POA: Diagnosis not present

## 2020-11-29 DIAGNOSIS — I10 Essential (primary) hypertension: Secondary | ICD-10-CM | POA: Diagnosis not present

## 2020-11-29 DIAGNOSIS — Z86718 Personal history of other venous thrombosis and embolism: Secondary | ICD-10-CM | POA: Diagnosis not present

## 2020-12-22 DIAGNOSIS — Z452 Encounter for adjustment and management of vascular access device: Secondary | ICD-10-CM | POA: Diagnosis not present

## 2021-01-25 DIAGNOSIS — M7052 Other bursitis of knee, left knee: Secondary | ICD-10-CM | POA: Diagnosis not present

## 2021-01-25 DIAGNOSIS — G8929 Other chronic pain: Secondary | ICD-10-CM | POA: Diagnosis not present

## 2021-01-25 DIAGNOSIS — E785 Hyperlipidemia, unspecified: Secondary | ICD-10-CM | POA: Diagnosis not present

## 2021-01-25 DIAGNOSIS — E1140 Type 2 diabetes mellitus with diabetic neuropathy, unspecified: Secondary | ICD-10-CM | POA: Diagnosis not present

## 2021-01-25 DIAGNOSIS — J449 Chronic obstructive pulmonary disease, unspecified: Secondary | ICD-10-CM | POA: Diagnosis not present

## 2021-01-25 DIAGNOSIS — M797 Fibromyalgia: Secondary | ICD-10-CM | POA: Diagnosis not present

## 2021-01-29 DIAGNOSIS — M25462 Effusion, left knee: Secondary | ICD-10-CM | POA: Diagnosis not present

## 2021-01-29 DIAGNOSIS — M25562 Pain in left knee: Secondary | ICD-10-CM | POA: Diagnosis not present

## 2021-02-08 DIAGNOSIS — R600 Localized edema: Secondary | ICD-10-CM | POA: Diagnosis not present

## 2021-02-08 DIAGNOSIS — M25562 Pain in left knee: Secondary | ICD-10-CM | POA: Diagnosis not present

## 2021-02-08 DIAGNOSIS — M1712 Unilateral primary osteoarthritis, left knee: Secondary | ICD-10-CM | POA: Diagnosis not present

## 2021-02-08 DIAGNOSIS — M23312 Other meniscus derangements, anterior horn of medial meniscus, left knee: Secondary | ICD-10-CM | POA: Diagnosis not present

## 2021-02-08 DIAGNOSIS — M25462 Effusion, left knee: Secondary | ICD-10-CM | POA: Diagnosis not present

## 2021-02-12 DIAGNOSIS — M1712 Unilateral primary osteoarthritis, left knee: Secondary | ICD-10-CM | POA: Diagnosis not present

## 2021-02-12 DIAGNOSIS — M23204 Derangement of unspecified medial meniscus due to old tear or injury, left knee: Secondary | ICD-10-CM | POA: Diagnosis not present

## 2021-02-12 DIAGNOSIS — M25462 Effusion, left knee: Secondary | ICD-10-CM | POA: Diagnosis not present

## 2021-02-12 DIAGNOSIS — X58XXXD Exposure to other specified factors, subsequent encounter: Secondary | ICD-10-CM | POA: Diagnosis not present

## 2021-02-12 DIAGNOSIS — S8992XD Unspecified injury of left lower leg, subsequent encounter: Secondary | ICD-10-CM | POA: Diagnosis not present

## 2021-02-19 DIAGNOSIS — Z95828 Presence of other vascular implants and grafts: Secondary | ICD-10-CM | POA: Diagnosis not present

## 2021-03-05 DIAGNOSIS — M25462 Effusion, left knee: Secondary | ICD-10-CM | POA: Diagnosis not present

## 2021-03-05 DIAGNOSIS — M23204 Derangement of unspecified medial meniscus due to old tear or injury, left knee: Secondary | ICD-10-CM | POA: Diagnosis not present

## 2021-03-05 DIAGNOSIS — S8992XD Unspecified injury of left lower leg, subsequent encounter: Secondary | ICD-10-CM | POA: Diagnosis not present

## 2021-03-05 DIAGNOSIS — E1165 Type 2 diabetes mellitus with hyperglycemia: Secondary | ICD-10-CM | POA: Diagnosis not present

## 2021-03-05 DIAGNOSIS — I471 Supraventricular tachycardia: Secondary | ICD-10-CM | POA: Diagnosis not present

## 2021-03-05 DIAGNOSIS — N189 Chronic kidney disease, unspecified: Secondary | ICD-10-CM | POA: Diagnosis not present

## 2021-03-05 DIAGNOSIS — E559 Vitamin D deficiency, unspecified: Secondary | ICD-10-CM | POA: Diagnosis not present

## 2021-03-05 DIAGNOSIS — M25562 Pain in left knee: Secondary | ICD-10-CM | POA: Diagnosis not present

## 2021-03-05 DIAGNOSIS — I10 Essential (primary) hypertension: Secondary | ICD-10-CM | POA: Diagnosis not present

## 2021-03-05 DIAGNOSIS — M1712 Unilateral primary osteoarthritis, left knee: Secondary | ICD-10-CM | POA: Diagnosis not present

## 2021-03-12 DIAGNOSIS — N189 Chronic kidney disease, unspecified: Secondary | ICD-10-CM | POA: Diagnosis not present

## 2021-03-12 DIAGNOSIS — I129 Hypertensive chronic kidney disease with stage 1 through stage 4 chronic kidney disease, or unspecified chronic kidney disease: Secondary | ICD-10-CM | POA: Diagnosis not present

## 2021-03-12 DIAGNOSIS — N1832 Chronic kidney disease, stage 3b: Secondary | ICD-10-CM | POA: Diagnosis not present

## 2021-03-12 DIAGNOSIS — G894 Chronic pain syndrome: Secondary | ICD-10-CM | POA: Diagnosis not present

## 2021-03-12 DIAGNOSIS — E782 Mixed hyperlipidemia: Secondary | ICD-10-CM | POA: Diagnosis not present

## 2021-03-12 DIAGNOSIS — C50912 Malignant neoplasm of unspecified site of left female breast: Secondary | ICD-10-CM | POA: Diagnosis not present

## 2021-03-12 DIAGNOSIS — K219 Gastro-esophageal reflux disease without esophagitis: Secondary | ICD-10-CM | POA: Diagnosis not present

## 2021-03-12 DIAGNOSIS — E559 Vitamin D deficiency, unspecified: Secondary | ICD-10-CM | POA: Diagnosis not present

## 2021-03-12 DIAGNOSIS — I471 Supraventricular tachycardia: Secondary | ICD-10-CM | POA: Diagnosis not present

## 2021-03-12 DIAGNOSIS — J449 Chronic obstructive pulmonary disease, unspecified: Secondary | ICD-10-CM | POA: Diagnosis not present

## 2021-03-12 DIAGNOSIS — E1122 Type 2 diabetes mellitus with diabetic chronic kidney disease: Secondary | ICD-10-CM | POA: Diagnosis not present

## 2021-03-12 DIAGNOSIS — J302 Other seasonal allergic rhinitis: Secondary | ICD-10-CM | POA: Diagnosis not present

## 2021-03-12 DIAGNOSIS — Z0001 Encounter for general adult medical examination with abnormal findings: Secondary | ICD-10-CM | POA: Diagnosis not present

## 2021-04-24 DIAGNOSIS — C7989 Secondary malignant neoplasm of other specified sites: Secondary | ICD-10-CM | POA: Diagnosis not present

## 2021-04-24 DIAGNOSIS — C50912 Malignant neoplasm of unspecified site of left female breast: Secondary | ICD-10-CM | POA: Diagnosis not present

## 2021-04-24 DIAGNOSIS — Z09 Encounter for follow-up examination after completed treatment for conditions other than malignant neoplasm: Secondary | ICD-10-CM | POA: Diagnosis not present

## 2021-04-24 DIAGNOSIS — C50412 Malignant neoplasm of upper-outer quadrant of left female breast: Secondary | ICD-10-CM | POA: Diagnosis not present

## 2021-04-24 DIAGNOSIS — I825Z1 Chronic embolism and thrombosis of unspecified deep veins of right distal lower extremity: Secondary | ICD-10-CM | POA: Diagnosis not present

## 2021-04-24 DIAGNOSIS — Z95828 Presence of other vascular implants and grafts: Secondary | ICD-10-CM | POA: Diagnosis not present

## 2021-04-24 DIAGNOSIS — E559 Vitamin D deficiency, unspecified: Secondary | ICD-10-CM | POA: Diagnosis not present

## 2021-04-27 DIAGNOSIS — Z1239 Encounter for other screening for malignant neoplasm of breast: Secondary | ICD-10-CM | POA: Diagnosis not present

## 2021-04-27 DIAGNOSIS — C7989 Secondary malignant neoplasm of other specified sites: Secondary | ICD-10-CM | POA: Diagnosis not present

## 2021-04-27 DIAGNOSIS — Z1231 Encounter for screening mammogram for malignant neoplasm of breast: Secondary | ICD-10-CM | POA: Diagnosis not present

## 2021-04-27 DIAGNOSIS — C50912 Malignant neoplasm of unspecified site of left female breast: Secondary | ICD-10-CM | POA: Diagnosis not present

## 2021-06-14 DIAGNOSIS — N189 Chronic kidney disease, unspecified: Secondary | ICD-10-CM | POA: Diagnosis not present

## 2021-06-14 DIAGNOSIS — E782 Mixed hyperlipidemia: Secondary | ICD-10-CM | POA: Diagnosis not present

## 2021-06-14 DIAGNOSIS — E1165 Type 2 diabetes mellitus with hyperglycemia: Secondary | ICD-10-CM | POA: Diagnosis not present

## 2021-06-18 DIAGNOSIS — E1122 Type 2 diabetes mellitus with diabetic chronic kidney disease: Secondary | ICD-10-CM | POA: Diagnosis not present

## 2021-06-18 DIAGNOSIS — J449 Chronic obstructive pulmonary disease, unspecified: Secondary | ICD-10-CM | POA: Diagnosis not present

## 2021-06-18 DIAGNOSIS — G894 Chronic pain syndrome: Secondary | ICD-10-CM | POA: Diagnosis not present

## 2021-06-18 DIAGNOSIS — K219 Gastro-esophageal reflux disease without esophagitis: Secondary | ICD-10-CM | POA: Diagnosis not present

## 2021-06-18 DIAGNOSIS — N1832 Chronic kidney disease, stage 3b: Secondary | ICD-10-CM | POA: Diagnosis not present

## 2021-06-18 DIAGNOSIS — J302 Other seasonal allergic rhinitis: Secondary | ICD-10-CM | POA: Diagnosis not present

## 2021-06-18 DIAGNOSIS — I471 Supraventricular tachycardia: Secondary | ICD-10-CM | POA: Diagnosis not present

## 2021-06-18 DIAGNOSIS — E782 Mixed hyperlipidemia: Secondary | ICD-10-CM | POA: Diagnosis not present

## 2021-06-18 DIAGNOSIS — Z0001 Encounter for general adult medical examination with abnormal findings: Secondary | ICD-10-CM | POA: Diagnosis not present

## 2021-06-18 DIAGNOSIS — C50912 Malignant neoplasm of unspecified site of left female breast: Secondary | ICD-10-CM | POA: Diagnosis not present

## 2021-06-18 DIAGNOSIS — I129 Hypertensive chronic kidney disease with stage 1 through stage 4 chronic kidney disease, or unspecified chronic kidney disease: Secondary | ICD-10-CM | POA: Diagnosis not present

## 2021-06-20 DIAGNOSIS — Z923 Personal history of irradiation: Secondary | ICD-10-CM | POA: Diagnosis not present

## 2021-06-20 DIAGNOSIS — Z853 Personal history of malignant neoplasm of breast: Secondary | ICD-10-CM | POA: Diagnosis not present

## 2021-06-20 DIAGNOSIS — C50912 Malignant neoplasm of unspecified site of left female breast: Secondary | ICD-10-CM | POA: Diagnosis not present

## 2021-06-20 DIAGNOSIS — C7989 Secondary malignant neoplasm of other specified sites: Secondary | ICD-10-CM | POA: Diagnosis not present

## 2021-06-20 DIAGNOSIS — Z9221 Personal history of antineoplastic chemotherapy: Secondary | ICD-10-CM | POA: Diagnosis not present

## 2021-06-20 DIAGNOSIS — Z08 Encounter for follow-up examination after completed treatment for malignant neoplasm: Secondary | ICD-10-CM | POA: Diagnosis not present

## 2021-06-20 DIAGNOSIS — Z8589 Personal history of malignant neoplasm of other organs and systems: Secondary | ICD-10-CM | POA: Diagnosis not present

## 2021-06-20 DIAGNOSIS — R0789 Other chest pain: Secondary | ICD-10-CM | POA: Diagnosis not present

## 2021-06-20 DIAGNOSIS — M79603 Pain in arm, unspecified: Secondary | ICD-10-CM | POA: Diagnosis not present

## 2021-07-25 DIAGNOSIS — Z7901 Long term (current) use of anticoagulants: Secondary | ICD-10-CM | POA: Diagnosis not present

## 2021-07-25 DIAGNOSIS — E559 Vitamin D deficiency, unspecified: Secondary | ICD-10-CM | POA: Diagnosis not present

## 2021-07-25 DIAGNOSIS — Z95828 Presence of other vascular implants and grafts: Secondary | ICD-10-CM | POA: Diagnosis not present

## 2021-07-25 DIAGNOSIS — C50912 Malignant neoplasm of unspecified site of left female breast: Secondary | ICD-10-CM | POA: Diagnosis not present

## 2021-07-25 DIAGNOSIS — C50412 Malignant neoplasm of upper-outer quadrant of left female breast: Secondary | ICD-10-CM | POA: Diagnosis not present

## 2021-07-25 DIAGNOSIS — C7989 Secondary malignant neoplasm of other specified sites: Secondary | ICD-10-CM | POA: Diagnosis not present

## 2021-07-25 DIAGNOSIS — Z09 Encounter for follow-up examination after completed treatment for conditions other than malignant neoplasm: Secondary | ICD-10-CM | POA: Diagnosis not present

## 2021-08-01 DIAGNOSIS — C50912 Malignant neoplasm of unspecified site of left female breast: Secondary | ICD-10-CM | POA: Diagnosis not present

## 2021-08-01 DIAGNOSIS — D649 Anemia, unspecified: Secondary | ICD-10-CM | POA: Diagnosis not present

## 2021-08-01 DIAGNOSIS — C7989 Secondary malignant neoplasm of other specified sites: Secondary | ICD-10-CM | POA: Diagnosis not present

## 2021-08-01 DIAGNOSIS — Z1239 Encounter for other screening for malignant neoplasm of breast: Secondary | ICD-10-CM | POA: Diagnosis not present

## 2021-08-01 DIAGNOSIS — R5382 Chronic fatigue, unspecified: Secondary | ICD-10-CM | POA: Diagnosis not present

## 2021-08-23 DIAGNOSIS — C50912 Malignant neoplasm of unspecified site of left female breast: Secondary | ICD-10-CM | POA: Diagnosis not present

## 2021-08-23 DIAGNOSIS — C7989 Secondary malignant neoplasm of other specified sites: Secondary | ICD-10-CM | POA: Diagnosis not present

## 2021-09-14 DIAGNOSIS — I129 Hypertensive chronic kidney disease with stage 1 through stage 4 chronic kidney disease, or unspecified chronic kidney disease: Secondary | ICD-10-CM | POA: Diagnosis not present

## 2021-09-14 DIAGNOSIS — E559 Vitamin D deficiency, unspecified: Secondary | ICD-10-CM | POA: Diagnosis not present

## 2021-09-14 DIAGNOSIS — E1169 Type 2 diabetes mellitus with other specified complication: Secondary | ICD-10-CM | POA: Diagnosis not present

## 2021-09-18 DIAGNOSIS — J449 Chronic obstructive pulmonary disease, unspecified: Secondary | ICD-10-CM | POA: Diagnosis not present

## 2021-09-18 DIAGNOSIS — G894 Chronic pain syndrome: Secondary | ICD-10-CM | POA: Diagnosis not present

## 2021-09-18 DIAGNOSIS — E1122 Type 2 diabetes mellitus with diabetic chronic kidney disease: Secondary | ICD-10-CM | POA: Diagnosis not present

## 2021-09-18 DIAGNOSIS — E782 Mixed hyperlipidemia: Secondary | ICD-10-CM | POA: Diagnosis not present

## 2021-09-18 DIAGNOSIS — E559 Vitamin D deficiency, unspecified: Secondary | ICD-10-CM | POA: Diagnosis not present

## 2021-09-18 DIAGNOSIS — C50912 Malignant neoplasm of unspecified site of left female breast: Secondary | ICD-10-CM | POA: Diagnosis not present

## 2021-09-18 DIAGNOSIS — I129 Hypertensive chronic kidney disease with stage 1 through stage 4 chronic kidney disease, or unspecified chronic kidney disease: Secondary | ICD-10-CM | POA: Diagnosis not present

## 2021-09-18 DIAGNOSIS — N1832 Chronic kidney disease, stage 3b: Secondary | ICD-10-CM | POA: Diagnosis not present

## 2021-09-18 DIAGNOSIS — I471 Supraventricular tachycardia: Secondary | ICD-10-CM | POA: Diagnosis not present

## 2021-09-18 DIAGNOSIS — K219 Gastro-esophageal reflux disease without esophagitis: Secondary | ICD-10-CM | POA: Diagnosis not present

## 2021-09-18 DIAGNOSIS — J302 Other seasonal allergic rhinitis: Secondary | ICD-10-CM | POA: Diagnosis not present

## 2021-10-08 NOTE — Progress Notes (Signed)
Cardiology Office Note:    Date:  10/12/2021   ID:  Kelli Wilson, DOB 1951-08-10, MRN [id number]  PCP:  Celene Squibb, MD  Cardiologist:  Kate Sable, MD (Inactive)  Electrophysiologist:  None   Referring MD: Celene Squibb, MD   Chief Complaint  Patient presents with   Shortness of Breath     History of Present Illness:    Kelli Wilson is a 70 y.o. female with a hx of SVT, T2DM, hypertension, hyperlipidemia, breast cancer, fibromyalgia, DVT who presents for follow-up.  Coronary angiogram on 04/2004 was normal.  Nuclear stress test 02/2010 was normal.  Echocardiogram 03/2018 showed EF 55 to 29%, grade 1 diastolic dysfunction.  Most recent echo 01/18/2020 at [facility] showed normal biventricular function, no significant valvular disease.  Cardiac monitor on 05/06/2018 was worn for 1 day, no arrhythmias.  Since last clinic visit, reports had been doing okay.  Reports has chronic chest pain, can last for hours.  Seems to be related to moving her arm in certain ways.  Also having dyspnea with exertion.  Denies any palpitations.  Having lightheadedness but no syncope.  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 dx 03/2018 - long RP likely atrial tachycardia.    Past Surgical History:  Procedure Laterality Date   APPENDECTOMY     bladder rectal repair     BREAST SURGERY Left    breast biopsy- benign   CHOLECYSTECTOMY     DILATION AND CURETTAGE OF UTERUS     GANGLION CYST EXCISION Left    MASTECTOMY MODIFIED RADICAL  Left 09/15/2017   Procedure: LEFT MODIFIED RADICAL MASTECTOMY;  Surgeon: Aviva Signs, MD;  Location: [facility];  Service: General;  Laterality: Left;   PORTACATH PLACEMENT Right 10/13/2017   Procedure: INSERTION PORT-A-CATH RIGHT SUBCLAVIAN;  Surgeon: Aviva Signs, MD;  Location: [facility];  Service: General;  Laterality: Right;   TOTAL ABDOMINAL HYSTERECTOMY      Current Medications: Current Meds  Medication Sig   ALPRAZolam (XANAX) 0.5 MG tablet Take 0.5 mg by mouth 4 (four) times daily as needed for anxiety.    FARXIGA 5 MG TABS tablet Take 5 mg by mouth daily.   HYDROcodone-acetaminophen (NORCO) 7.5-325 MG tablet TAKE ONE TABLET BY MOUTH FOUR TIMES DAILY AS NEEDED FOR PAIN.   hydrOXYzine (ATARAX/VISTARIL) 50 MG tablet Take 50 mg by mouth every other day. Every other day at bedtime and as needed   magnesium oxide (MAG-OX) 400 (241.3 Mg) MG tablet Take 1 tablet (400 mg total) by mouth 3 (three) times daily between meals. (Patient taking differently: Take 400 mg by mouth daily.)   metoprolol succinate (TOPROL-XL) 100 MG 24 hr tablet Take 1 tablet (100 mg total) by mouth daily. Take with or immediately following a meal.   nitroGLYCERIN (NITROSTAT) 0.4 MG SL tablet Place 0.4 mg under the tongue every  5 (five) minutes as needed for chest pain.   omeprazole (PRILOSEC) 40 MG capsule Take 40 mg every other day by mouth. At night   pravastatin (PRAVACHOL) 40 MG tablet Take 40 mg by mouth daily.   rivaroxaban (XARELTO) 10 MG TABS tablet Take 1 tablet by mouth daily.   Semaglutide (RYBELSUS) 7 MG TABS Rybelsus 7 mg tablet  Take 1 tablet every day by oral route.     Allergies:   Magnesium sulfate, Other, Cortisone, Iohexol, Ivp dye [iodinated diagnostic agents], Januvia [sitagliptin], Latex, Motrin [ibuprofen], Nsaids, Statins, Sulfa antibiotics, and Adhesive [tape]   Social History   Socioeconomic History   Marital status: Married    Spouse name: Not on file   Number of children: Not on file    Years of education: Not on file   Highest education level: Not on file  Occupational History   Occupation: disabled    Comment: disabled from a Equities trader (Byssinosis)  Tobacco Use   Smoking status: Never   Smokeless tobacco: Current    Types: Snuff  Vaping Use   Vaping Use: Never used  Substance and Sexual Activity   Alcohol use: No   Drug use: No   Sexual activity: Never    Birth control/protection: Surgical  Other Topics Concern   Not on file  Social History Narrative   Not on file   Social Determinants of Health   Financial Resource Strain: Not on file  Food Insecurity: Not on file  Transportation Needs: Not on file  Physical Activity: Not on file  Stress: Not on file  Social Connections: Not on file     Family History: The patient's family history includes Aneurysm in her father; COPD in her mother; Diabetes in her mother; Heart disease in her father.  ROS:   Please see the history of present illness.     All other systems reviewed and are negative.  EKGs/Labs/Other Studies Reviewed:    The following studies were reviewed today:   EKG:  EKG is  ordered today.  The ekg ordered today demonstrates normal sinus rhythm, right axis deviation, nonspecific intraventricular conduction delay, T wave inversions in the inferior leads and V6  Recent Labs: No results found for requested labs within last 8760 hours.  Recent Lipid Panel    Component Value Date/Time   CHOL 166 02/16/2018 1122   TRIG 196 (H) 02/16/2018 1122   HDL 50 02/16/2018 1122   CHOLHDL 3.3 02/16/2018 1122   VLDL 39 02/16/2018 1122   LDLCALC 77 02/16/2018 1122    Physical Exam:    VS:  BP 134/78   Pulse 84   Ht 5' 7.5" (1.715 m)   Wt 281 lb (127.5 kg)   SpO2 96%   BMI 43.36 kg/m     Wt Readings from Last 3 Encounters:  10/12/21 281 lb (127.5 kg)  10/05/20 275 lb 12.8 oz (125.1 kg)  09/10/19 262 lb (118.8 kg)     GEN:  Well nourished, well developed in no acute distress HEENT:  Normal NECK: No JVD; No carotid bruits LYMPHATICS: No lymphadenopathy CARDIAC: RRR, no murmurs, rubs, gallops RESPIRATORY:  Clear to auscultation without rales, wheezing or rhonchi  ABDOMEN: Soft, non-tender, non-distended MUSCULOSKELETAL:  No edema; No deformity  SKIN: Warm and dry NEUROLOGIC:  Alert and oriented x 3 PSYCHIATRIC:  Normal affect   ASSESSMENT:    1. DOE (dyspnea on exertion)   2. Primary hypertension   3. Chest pain of uncertain etiology   4.  PSVT (paroxysmal supraventricular tachycardia) ([place])   5. Hyperlipidemia, unspecified hyperlipidemia type    PLAN:     Dyspnea/chest pain: Chest pain is atypical in description.  Suspect MSK pain as notices it worse when she moves her arm in certain ways.  However she is also having dyspnea with minimal exertion which could represent anginal equivalent.  Discussed obtaining coronary CTA or stress test for further evaluation, but she declines at this time.  Advised that if symptoms worsen, would recommend ischemic evaluation.  Echocardiogram last year at [facility] was unremarkable.  PSVT/atrial tachycardia: Symptomatically stable on Toprol-XL 100 mg daily.  No changes.  Reports palpitations have improved   Hyperlipidemia: Continue pravastatin.  LDL 76 on 03/05/2021.   Near syncope/orthostatic hypotension: Previously thought to be GI losses from chronic diarrhea.  Reports has improved significantly since she stopped metformin.  No symptoms recently   Hypertension: Controlled on present therapy.  No changes.   Right leg DVT: On Xarelto.  Diagnosed in April 2020.     RTC in 6 months   Medication Adjustments/Labs and Tests Ordered: Current medicines are reviewed at length with the patient today.  Concerns regarding medicines are outlined above.  Orders Placed This Encounter  Procedures   EKG 12-Lead   No orders of the defined types were placed in this encounter.   Patient Instructions  Medication Instructions:  Your physician  recommends that you continue on your current medications as directed. Please refer to the Current Medication list given to you today.   Labwork:  None today   Testing/Procedures: None today   Follow-Up: 6 months with MD  Any Other Special Instructions Will Be Listed Below (If Applicable).  If you need a refill on your cardiac medications before your next appointment, please call your pharmacy.   Signed, Donato Heinz, MD  10/12/2021 11:05 PM    [place]

## 2021-10-11 ENCOUNTER — Ambulatory Visit: Payer: Medicare Other | Admitting: Cardiology

## 2021-10-12 ENCOUNTER — Encounter: Payer: Self-pay | Admitting: Cardiology

## 2021-10-12 ENCOUNTER — Ambulatory Visit: Payer: Medicare Other | Admitting: Cardiology

## 2021-10-12 ENCOUNTER — Other Ambulatory Visit: Payer: Self-pay

## 2021-10-12 VITALS — BP 134/78 | HR 84 | Ht 67.5 in | Wt 281.0 lb

## 2021-10-12 DIAGNOSIS — I10 Essential (primary) hypertension: Secondary | ICD-10-CM

## 2021-10-12 DIAGNOSIS — E785 Hyperlipidemia, unspecified: Secondary | ICD-10-CM

## 2021-10-12 DIAGNOSIS — R079 Chest pain, unspecified: Secondary | ICD-10-CM | POA: Diagnosis not present

## 2021-10-12 DIAGNOSIS — I471 Supraventricular tachycardia: Secondary | ICD-10-CM | POA: Diagnosis not present

## 2021-10-12 DIAGNOSIS — R0609 Other forms of dyspnea: Secondary | ICD-10-CM

## 2021-10-12 NOTE — Patient Instructions (Signed)
Medication Instructions:  Your physician recommends that you continue on your current medications as directed. Please refer to the Current Medication list given to you today.   Labwork:  None today   Testing/Procedures: None today   Follow-Up: 6 months with MD  Any Other Special Instructions Will Be Listed Below (If Applicable).  If you need a refill on your cardiac medications before your next appointment, please call your pharmacy.

## 2021-10-16 DIAGNOSIS — E611 Iron deficiency: Secondary | ICD-10-CM | POA: Diagnosis not present

## 2021-10-16 DIAGNOSIS — E1122 Type 2 diabetes mellitus with diabetic chronic kidney disease: Secondary | ICD-10-CM | POA: Diagnosis not present

## 2021-10-16 DIAGNOSIS — I5032 Chronic diastolic (congestive) heart failure: Secondary | ICD-10-CM | POA: Diagnosis not present

## 2021-10-16 DIAGNOSIS — I129 Hypertensive chronic kidney disease with stage 1 through stage 4 chronic kidney disease, or unspecified chronic kidney disease: Secondary | ICD-10-CM | POA: Diagnosis not present

## 2021-10-16 DIAGNOSIS — R809 Proteinuria, unspecified: Secondary | ICD-10-CM | POA: Diagnosis not present

## 2021-10-16 DIAGNOSIS — E559 Vitamin D deficiency, unspecified: Secondary | ICD-10-CM | POA: Diagnosis not present

## 2021-10-16 DIAGNOSIS — N189 Chronic kidney disease, unspecified: Secondary | ICD-10-CM | POA: Diagnosis not present

## 2021-10-16 DIAGNOSIS — E1129 Type 2 diabetes mellitus with other diabetic kidney complication: Secondary | ICD-10-CM | POA: Diagnosis not present

## 2021-10-24 DIAGNOSIS — E1122 Type 2 diabetes mellitus with diabetic chronic kidney disease: Secondary | ICD-10-CM | POA: Diagnosis not present

## 2021-10-24 DIAGNOSIS — I129 Hypertensive chronic kidney disease with stage 1 through stage 4 chronic kidney disease, or unspecified chronic kidney disease: Secondary | ICD-10-CM | POA: Diagnosis not present

## 2021-10-24 DIAGNOSIS — E1129 Type 2 diabetes mellitus with other diabetic kidney complication: Secondary | ICD-10-CM | POA: Diagnosis not present

## 2021-10-24 DIAGNOSIS — N189 Chronic kidney disease, unspecified: Secondary | ICD-10-CM | POA: Diagnosis not present

## 2021-10-24 DIAGNOSIS — Z79899 Other long term (current) drug therapy: Secondary | ICD-10-CM | POA: Diagnosis not present

## 2021-10-24 DIAGNOSIS — E611 Iron deficiency: Secondary | ICD-10-CM | POA: Diagnosis not present

## 2021-10-24 DIAGNOSIS — I5032 Chronic diastolic (congestive) heart failure: Secondary | ICD-10-CM | POA: Diagnosis not present

## 2021-10-24 DIAGNOSIS — E559 Vitamin D deficiency, unspecified: Secondary | ICD-10-CM | POA: Diagnosis not present

## 2021-10-25 DIAGNOSIS — Z09 Encounter for follow-up examination after completed treatment for conditions other than malignant neoplasm: Secondary | ICD-10-CM | POA: Diagnosis not present

## 2021-10-25 DIAGNOSIS — Z1239 Encounter for other screening for malignant neoplasm of breast: Secondary | ICD-10-CM | POA: Diagnosis not present

## 2021-10-25 DIAGNOSIS — R5382 Chronic fatigue, unspecified: Secondary | ICD-10-CM | POA: Diagnosis not present

## 2021-10-25 DIAGNOSIS — I825Z1 Chronic embolism and thrombosis of unspecified deep veins of right distal lower extremity: Secondary | ICD-10-CM | POA: Diagnosis not present

## 2021-10-25 DIAGNOSIS — D509 Iron deficiency anemia, unspecified: Secondary | ICD-10-CM | POA: Diagnosis not present

## 2021-10-25 DIAGNOSIS — E559 Vitamin D deficiency, unspecified: Secondary | ICD-10-CM | POA: Diagnosis not present

## 2021-10-25 DIAGNOSIS — C50412 Malignant neoplasm of upper-outer quadrant of left female breast: Secondary | ICD-10-CM | POA: Diagnosis not present

## 2021-10-25 DIAGNOSIS — C7989 Secondary malignant neoplasm of other specified sites: Secondary | ICD-10-CM | POA: Diagnosis not present

## 2021-10-26 DIAGNOSIS — E1122 Type 2 diabetes mellitus with diabetic chronic kidney disease: Secondary | ICD-10-CM | POA: Diagnosis not present

## 2021-10-26 DIAGNOSIS — I5032 Chronic diastolic (congestive) heart failure: Secondary | ICD-10-CM | POA: Diagnosis not present

## 2021-10-26 DIAGNOSIS — N182 Chronic kidney disease, stage 2 (mild): Secondary | ICD-10-CM | POA: Diagnosis not present

## 2021-10-26 DIAGNOSIS — R809 Proteinuria, unspecified: Secondary | ICD-10-CM | POA: Diagnosis not present

## 2021-10-26 DIAGNOSIS — I129 Hypertensive chronic kidney disease with stage 1 through stage 4 chronic kidney disease, or unspecified chronic kidney disease: Secondary | ICD-10-CM | POA: Diagnosis not present

## 2021-10-26 DIAGNOSIS — N189 Chronic kidney disease, unspecified: Secondary | ICD-10-CM | POA: Diagnosis not present

## 2021-11-05 DIAGNOSIS — Z1231 Encounter for screening mammogram for malignant neoplasm of breast: Secondary | ICD-10-CM | POA: Diagnosis not present

## 2021-11-05 DIAGNOSIS — C7989 Secondary malignant neoplasm of other specified sites: Secondary | ICD-10-CM | POA: Diagnosis not present

## 2021-11-05 DIAGNOSIS — C50912 Malignant neoplasm of unspecified site of left female breast: Secondary | ICD-10-CM | POA: Diagnosis not present

## 2021-11-05 DIAGNOSIS — Z1239 Encounter for other screening for malignant neoplasm of breast: Secondary | ICD-10-CM | POA: Diagnosis not present

## 2021-11-07 DIAGNOSIS — C7989 Secondary malignant neoplasm of other specified sites: Secondary | ICD-10-CM | POA: Diagnosis not present

## 2021-11-07 DIAGNOSIS — C50912 Malignant neoplasm of unspecified site of left female breast: Secondary | ICD-10-CM | POA: Diagnosis not present

## 2021-11-07 DIAGNOSIS — D649 Anemia, unspecified: Secondary | ICD-10-CM | POA: Diagnosis not present

## 2021-11-07 DIAGNOSIS — R5382 Chronic fatigue, unspecified: Secondary | ICD-10-CM | POA: Diagnosis not present

## 2021-11-07 DIAGNOSIS — Z1239 Encounter for other screening for malignant neoplasm of breast: Secondary | ICD-10-CM | POA: Diagnosis not present

## 2021-11-13 DIAGNOSIS — E1122 Type 2 diabetes mellitus with diabetic chronic kidney disease: Secondary | ICD-10-CM | POA: Diagnosis not present

## 2021-11-13 DIAGNOSIS — N189 Chronic kidney disease, unspecified: Secondary | ICD-10-CM | POA: Diagnosis not present

## 2021-11-13 DIAGNOSIS — I129 Hypertensive chronic kidney disease with stage 1 through stage 4 chronic kidney disease, or unspecified chronic kidney disease: Secondary | ICD-10-CM | POA: Diagnosis not present

## 2021-11-13 DIAGNOSIS — E611 Iron deficiency: Secondary | ICD-10-CM | POA: Diagnosis not present

## 2021-11-13 DIAGNOSIS — I5032 Chronic diastolic (congestive) heart failure: Secondary | ICD-10-CM | POA: Diagnosis not present

## 2021-11-13 DIAGNOSIS — E211 Secondary hyperparathyroidism, not elsewhere classified: Secondary | ICD-10-CM | POA: Diagnosis not present

## 2021-12-06 DIAGNOSIS — R928 Other abnormal and inconclusive findings on diagnostic imaging of breast: Secondary | ICD-10-CM | POA: Diagnosis not present

## 2021-12-06 DIAGNOSIS — R922 Inconclusive mammogram: Secondary | ICD-10-CM | POA: Diagnosis not present

## 2021-12-06 DIAGNOSIS — N6315 Unspecified lump in the right breast, overlapping quadrants: Secondary | ICD-10-CM | POA: Diagnosis not present

## 2021-12-11 DIAGNOSIS — C50412 Malignant neoplasm of upper-outer quadrant of left female breast: Secondary | ICD-10-CM | POA: Diagnosis not present

## 2021-12-11 DIAGNOSIS — C50912 Malignant neoplasm of unspecified site of left female breast: Secondary | ICD-10-CM | POA: Diagnosis not present

## 2021-12-11 DIAGNOSIS — C50919 Malignant neoplasm of unspecified site of unspecified female breast: Secondary | ICD-10-CM | POA: Diagnosis not present

## 2021-12-11 DIAGNOSIS — D508 Other iron deficiency anemias: Secondary | ICD-10-CM | POA: Diagnosis not present

## 2021-12-11 DIAGNOSIS — C7989 Secondary malignant neoplasm of other specified sites: Secondary | ICD-10-CM | POA: Diagnosis not present

## 2021-12-11 DIAGNOSIS — I825Z1 Chronic embolism and thrombosis of unspecified deep veins of right distal lower extremity: Secondary | ICD-10-CM | POA: Diagnosis not present

## 2021-12-11 DIAGNOSIS — Z09 Encounter for follow-up examination after completed treatment for conditions other than malignant neoplasm: Secondary | ICD-10-CM | POA: Diagnosis not present

## 2021-12-11 DIAGNOSIS — Z7901 Long term (current) use of anticoagulants: Secondary | ICD-10-CM | POA: Diagnosis not present

## 2021-12-17 DIAGNOSIS — E559 Vitamin D deficiency, unspecified: Secondary | ICD-10-CM | POA: Diagnosis not present

## 2021-12-17 DIAGNOSIS — I129 Hypertensive chronic kidney disease with stage 1 through stage 4 chronic kidney disease, or unspecified chronic kidney disease: Secondary | ICD-10-CM | POA: Diagnosis not present

## 2021-12-17 DIAGNOSIS — E1165 Type 2 diabetes mellitus with hyperglycemia: Secondary | ICD-10-CM | POA: Diagnosis not present

## 2021-12-19 DIAGNOSIS — C50919 Malignant neoplasm of unspecified site of unspecified female breast: Secondary | ICD-10-CM | POA: Diagnosis not present

## 2021-12-19 DIAGNOSIS — I2510 Atherosclerotic heart disease of native coronary artery without angina pectoris: Secondary | ICD-10-CM | POA: Diagnosis not present

## 2021-12-19 DIAGNOSIS — I272 Pulmonary hypertension, unspecified: Secondary | ICD-10-CM | POA: Diagnosis not present

## 2021-12-19 DIAGNOSIS — K3189 Other diseases of stomach and duodenum: Secondary | ICD-10-CM | POA: Diagnosis not present

## 2021-12-19 DIAGNOSIS — C7800 Secondary malignant neoplasm of unspecified lung: Secondary | ICD-10-CM | POA: Diagnosis not present

## 2021-12-19 DIAGNOSIS — C7951 Secondary malignant neoplasm of bone: Secondary | ICD-10-CM | POA: Diagnosis not present

## 2021-12-19 DIAGNOSIS — C50912 Malignant neoplasm of unspecified site of left female breast: Secondary | ICD-10-CM | POA: Diagnosis not present

## 2021-12-19 DIAGNOSIS — I700 Atherosclerosis of aorta: Secondary | ICD-10-CM | POA: Diagnosis not present

## 2021-12-19 DIAGNOSIS — K7689 Other specified diseases of liver: Secondary | ICD-10-CM | POA: Diagnosis not present

## 2021-12-19 DIAGNOSIS — M879 Osteonecrosis, unspecified: Secondary | ICD-10-CM | POA: Diagnosis not present

## 2021-12-24 DIAGNOSIS — T46905S Adverse effect of unspecified agents primarily affecting the cardiovascular system, sequela: Secondary | ICD-10-CM | POA: Diagnosis not present

## 2021-12-24 DIAGNOSIS — C7989 Secondary malignant neoplasm of other specified sites: Secondary | ICD-10-CM | POA: Diagnosis not present

## 2021-12-24 DIAGNOSIS — D509 Iron deficiency anemia, unspecified: Secondary | ICD-10-CM | POA: Diagnosis not present

## 2021-12-24 DIAGNOSIS — C50912 Malignant neoplasm of unspecified site of left female breast: Secondary | ICD-10-CM | POA: Diagnosis not present

## 2021-12-24 DIAGNOSIS — G894 Chronic pain syndrome: Secondary | ICD-10-CM | POA: Diagnosis not present

## 2021-12-24 DIAGNOSIS — E559 Vitamin D deficiency, unspecified: Secondary | ICD-10-CM | POA: Diagnosis not present

## 2021-12-24 DIAGNOSIS — J449 Chronic obstructive pulmonary disease, unspecified: Secondary | ICD-10-CM | POA: Diagnosis not present

## 2021-12-24 DIAGNOSIS — E1122 Type 2 diabetes mellitus with diabetic chronic kidney disease: Secondary | ICD-10-CM | POA: Diagnosis not present

## 2021-12-24 DIAGNOSIS — E782 Mixed hyperlipidemia: Secondary | ICD-10-CM | POA: Diagnosis not present

## 2021-12-24 DIAGNOSIS — J302 Other seasonal allergic rhinitis: Secondary | ICD-10-CM | POA: Diagnosis not present

## 2021-12-24 DIAGNOSIS — I129 Hypertensive chronic kidney disease with stage 1 through stage 4 chronic kidney disease, or unspecified chronic kidney disease: Secondary | ICD-10-CM | POA: Diagnosis not present

## 2021-12-24 DIAGNOSIS — K219 Gastro-esophageal reflux disease without esophagitis: Secondary | ICD-10-CM | POA: Diagnosis not present

## 2021-12-24 DIAGNOSIS — I471 Supraventricular tachycardia: Secondary | ICD-10-CM | POA: Diagnosis not present

## 2021-12-28 DIAGNOSIS — M1990 Unspecified osteoarthritis, unspecified site: Secondary | ICD-10-CM | POA: Diagnosis not present

## 2021-12-28 DIAGNOSIS — C799 Secondary malignant neoplasm of unspecified site: Secondary | ICD-10-CM | POA: Diagnosis not present

## 2021-12-28 DIAGNOSIS — J449 Chronic obstructive pulmonary disease, unspecified: Secondary | ICD-10-CM | POA: Diagnosis not present

## 2021-12-28 DIAGNOSIS — I499 Cardiac arrhythmia, unspecified: Secondary | ICD-10-CM | POA: Diagnosis not present

## 2021-12-31 DIAGNOSIS — T46905S Adverse effect of unspecified agents primarily affecting the cardiovascular system, sequela: Secondary | ICD-10-CM | POA: Diagnosis not present

## 2021-12-31 DIAGNOSIS — C50912 Malignant neoplasm of unspecified site of left female breast: Secondary | ICD-10-CM | POA: Diagnosis not present

## 2021-12-31 DIAGNOSIS — I340 Nonrheumatic mitral (valve) insufficiency: Secondary | ICD-10-CM | POA: Diagnosis not present

## 2021-12-31 DIAGNOSIS — I3481 Nonrheumatic mitral (valve) annulus calcification: Secondary | ICD-10-CM | POA: Diagnosis not present

## 2021-12-31 DIAGNOSIS — C7989 Secondary malignant neoplasm of other specified sites: Secondary | ICD-10-CM | POA: Diagnosis not present

## 2022-01-01 ENCOUNTER — Other Ambulatory Visit (HOSPITAL_COMMUNITY): Payer: Self-pay | Admitting: Hematology and Oncology

## 2022-01-01 DIAGNOSIS — C50912 Malignant neoplasm of unspecified site of left female breast: Secondary | ICD-10-CM

## 2022-01-03 DIAGNOSIS — C50912 Malignant neoplasm of unspecified site of left female breast: Secondary | ICD-10-CM | POA: Diagnosis not present

## 2022-01-03 DIAGNOSIS — C7989 Secondary malignant neoplasm of other specified sites: Secondary | ICD-10-CM | POA: Diagnosis not present

## 2022-01-03 DIAGNOSIS — Z95828 Presence of other vascular implants and grafts: Secondary | ICD-10-CM | POA: Diagnosis not present

## 2022-01-03 DIAGNOSIS — Z171 Estrogen receptor negative status [ER-]: Secondary | ICD-10-CM | POA: Diagnosis not present

## 2022-01-03 DIAGNOSIS — C50412 Malignant neoplasm of upper-outer quadrant of left female breast: Secondary | ICD-10-CM | POA: Diagnosis not present

## 2022-01-07 DIAGNOSIS — R0989 Other specified symptoms and signs involving the circulatory and respiratory systems: Secondary | ICD-10-CM | POA: Diagnosis not present

## 2022-01-07 DIAGNOSIS — C787 Secondary malignant neoplasm of liver and intrahepatic bile duct: Secondary | ICD-10-CM | POA: Diagnosis not present

## 2022-01-07 DIAGNOSIS — C7802 Secondary malignant neoplasm of left lung: Secondary | ICD-10-CM | POA: Diagnosis not present

## 2022-01-07 DIAGNOSIS — C7801 Secondary malignant neoplasm of right lung: Secondary | ICD-10-CM | POA: Diagnosis not present

## 2022-01-07 DIAGNOSIS — R059 Cough, unspecified: Secondary | ICD-10-CM | POA: Diagnosis not present

## 2022-01-07 DIAGNOSIS — U071 COVID-19: Secondary | ICD-10-CM | POA: Diagnosis not present

## 2022-01-07 DIAGNOSIS — J069 Acute upper respiratory infection, unspecified: Secondary | ICD-10-CM | POA: Diagnosis not present

## 2022-01-07 DIAGNOSIS — R509 Fever, unspecified: Secondary | ICD-10-CM | POA: Diagnosis not present

## 2022-01-10 ENCOUNTER — Encounter (HOSPITAL_COMMUNITY)
Admission: RE | Admit: 2022-01-10 | Discharge: 2022-01-10 | Disposition: A | Discharge status: Home / Self Care | Payer: Medicare Other | Source: Ambulatory Visit | Attending: Hematology and Oncology | Admitting: Hematology and Oncology

## 2022-01-10 ENCOUNTER — Other Ambulatory Visit: Payer: Self-pay

## 2022-01-10 DIAGNOSIS — C771 Secondary and unspecified malignant neoplasm of intrathoracic lymph nodes: Secondary | ICD-10-CM | POA: Diagnosis not present

## 2022-01-10 DIAGNOSIS — I2510 Atherosclerotic heart disease of native coronary artery without angina pectoris: Secondary | ICD-10-CM | POA: Diagnosis not present

## 2022-01-10 DIAGNOSIS — C7989 Secondary malignant neoplasm of other specified sites: Secondary | ICD-10-CM | POA: Diagnosis not present

## 2022-01-10 DIAGNOSIS — J984 Other disorders of lung: Secondary | ICD-10-CM | POA: Diagnosis not present

## 2022-01-10 DIAGNOSIS — C50912 Malignant neoplasm of unspecified site of left female breast: Secondary | ICD-10-CM | POA: Insufficient documentation

## 2022-01-10 DIAGNOSIS — C50919 Malignant neoplasm of unspecified site of unspecified female breast: Secondary | ICD-10-CM | POA: Diagnosis not present

## 2022-01-10 MED ORDER — FLUDEOXYGLUCOSE F - 18 (FDG) INJECTION
15.2300 | Freq: Once | INTRAVENOUS | Status: AC | PRN
  Administered 2022-01-10: 15.23 via INTRAVENOUS

## 2022-01-11 DIAGNOSIS — I129 Hypertensive chronic kidney disease with stage 1 through stage 4 chronic kidney disease, or unspecified chronic kidney disease: Secondary | ICD-10-CM | POA: Diagnosis not present

## 2022-01-11 DIAGNOSIS — E1122 Type 2 diabetes mellitus with diabetic chronic kidney disease: Secondary | ICD-10-CM | POA: Diagnosis not present

## 2022-01-11 DIAGNOSIS — E611 Iron deficiency: Secondary | ICD-10-CM | POA: Diagnosis not present

## 2022-01-11 DIAGNOSIS — N189 Chronic kidney disease, unspecified: Secondary | ICD-10-CM | POA: Diagnosis not present

## 2022-01-11 DIAGNOSIS — I5032 Chronic diastolic (congestive) heart failure: Secondary | ICD-10-CM | POA: Diagnosis not present

## 2022-01-21 DIAGNOSIS — I517 Cardiomegaly: Secondary | ICD-10-CM | POA: Diagnosis not present

## 2022-01-21 DIAGNOSIS — Z5111 Encounter for antineoplastic chemotherapy: Secondary | ICD-10-CM | POA: Diagnosis not present

## 2022-01-21 DIAGNOSIS — Z09 Encounter for follow-up examination after completed treatment for conditions other than malignant neoplasm: Secondary | ICD-10-CM | POA: Diagnosis not present

## 2022-01-21 DIAGNOSIS — G8918 Other acute postprocedural pain: Secondary | ICD-10-CM | POA: Diagnosis not present

## 2022-01-21 DIAGNOSIS — C50912 Malignant neoplasm of unspecified site of left female breast: Secondary | ICD-10-CM | POA: Diagnosis not present

## 2022-01-21 DIAGNOSIS — C7989 Secondary malignant neoplasm of other specified sites: Secondary | ICD-10-CM | POA: Diagnosis not present

## 2022-01-24 DIAGNOSIS — I825Z1 Chronic embolism and thrombosis of unspecified deep veins of right distal lower extremity: Secondary | ICD-10-CM | POA: Diagnosis not present

## 2022-01-24 DIAGNOSIS — C50919 Malignant neoplasm of unspecified site of unspecified female breast: Secondary | ICD-10-CM | POA: Diagnosis not present

## 2022-01-24 DIAGNOSIS — D508 Other iron deficiency anemias: Secondary | ICD-10-CM | POA: Diagnosis not present

## 2022-01-24 DIAGNOSIS — C50412 Malignant neoplasm of upper-outer quadrant of left female breast: Secondary | ICD-10-CM | POA: Diagnosis not present

## 2022-01-24 DIAGNOSIS — Z7901 Long term (current) use of anticoagulants: Secondary | ICD-10-CM | POA: Diagnosis not present

## 2022-01-24 DIAGNOSIS — Z09 Encounter for follow-up examination after completed treatment for conditions other than malignant neoplasm: Secondary | ICD-10-CM | POA: Diagnosis not present

## 2022-01-24 DIAGNOSIS — C50912 Malignant neoplasm of unspecified site of left female breast: Secondary | ICD-10-CM | POA: Diagnosis not present

## 2022-01-24 DIAGNOSIS — C7989 Secondary malignant neoplasm of other specified sites: Secondary | ICD-10-CM | POA: Diagnosis not present

## 2022-01-25 DIAGNOSIS — J449 Chronic obstructive pulmonary disease, unspecified: Secondary | ICD-10-CM | POA: Diagnosis not present

## 2022-01-25 DIAGNOSIS — M1990 Unspecified osteoarthritis, unspecified site: Secondary | ICD-10-CM | POA: Diagnosis not present

## 2022-01-25 DIAGNOSIS — C799 Secondary malignant neoplasm of unspecified site: Secondary | ICD-10-CM | POA: Diagnosis not present

## 2022-01-25 DIAGNOSIS — I499 Cardiac arrhythmia, unspecified: Secondary | ICD-10-CM | POA: Diagnosis not present

## 2022-01-28 DIAGNOSIS — C50412 Malignant neoplasm of upper-outer quadrant of left female breast: Secondary | ICD-10-CM | POA: Diagnosis not present

## 2022-01-28 DIAGNOSIS — Z171 Estrogen receptor negative status [ER-]: Secondary | ICD-10-CM | POA: Diagnosis not present

## 2022-01-29 DIAGNOSIS — C50412 Malignant neoplasm of upper-outer quadrant of left female breast: Secondary | ICD-10-CM | POA: Diagnosis not present

## 2022-01-29 DIAGNOSIS — Z171 Estrogen receptor negative status [ER-]: Secondary | ICD-10-CM | POA: Diagnosis not present

## 2022-01-30 DIAGNOSIS — E211 Secondary hyperparathyroidism, not elsewhere classified: Secondary | ICD-10-CM | POA: Diagnosis not present

## 2022-01-30 DIAGNOSIS — I5042 Chronic combined systolic (congestive) and diastolic (congestive) heart failure: Secondary | ICD-10-CM | POA: Diagnosis not present

## 2022-01-30 DIAGNOSIS — E871 Hypo-osmolality and hyponatremia: Secondary | ICD-10-CM | POA: Diagnosis not present

## 2022-01-30 DIAGNOSIS — I129 Hypertensive chronic kidney disease with stage 1 through stage 4 chronic kidney disease, or unspecified chronic kidney disease: Secondary | ICD-10-CM | POA: Diagnosis not present

## 2022-01-30 DIAGNOSIS — E8722 Chronic metabolic acidosis: Secondary | ICD-10-CM | POA: Diagnosis not present

## 2022-01-30 DIAGNOSIS — E1122 Type 2 diabetes mellitus with diabetic chronic kidney disease: Secondary | ICD-10-CM | POA: Diagnosis not present

## 2022-01-30 DIAGNOSIS — N189 Chronic kidney disease, unspecified: Secondary | ICD-10-CM | POA: Diagnosis not present

## 2022-02-02 DIAGNOSIS — Z171 Estrogen receptor negative status [ER-]: Secondary | ICD-10-CM | POA: Diagnosis not present

## 2022-02-02 DIAGNOSIS — C50412 Malignant neoplasm of upper-outer quadrant of left female breast: Secondary | ICD-10-CM | POA: Diagnosis not present

## 2022-02-06 DIAGNOSIS — Z171 Estrogen receptor negative status [ER-]: Secondary | ICD-10-CM | POA: Diagnosis not present

## 2022-02-06 DIAGNOSIS — C50412 Malignant neoplasm of upper-outer quadrant of left female breast: Secondary | ICD-10-CM | POA: Diagnosis not present

## 2022-02-12 DIAGNOSIS — Z171 Estrogen receptor negative status [ER-]: Secondary | ICD-10-CM | POA: Diagnosis not present

## 2022-02-12 DIAGNOSIS — Z1159 Encounter for screening for other viral diseases: Secondary | ICD-10-CM | POA: Diagnosis not present

## 2022-02-12 DIAGNOSIS — C50412 Malignant neoplasm of upper-outer quadrant of left female breast: Secondary | ICD-10-CM | POA: Diagnosis not present

## 2022-02-12 DIAGNOSIS — C7802 Secondary malignant neoplasm of left lung: Secondary | ICD-10-CM | POA: Diagnosis not present

## 2022-02-12 DIAGNOSIS — C7989 Secondary malignant neoplasm of other specified sites: Secondary | ICD-10-CM | POA: Diagnosis not present

## 2022-02-12 DIAGNOSIS — C50912 Malignant neoplasm of unspecified site of left female breast: Secondary | ICD-10-CM | POA: Diagnosis not present

## 2022-02-12 DIAGNOSIS — C7801 Secondary malignant neoplasm of right lung: Secondary | ICD-10-CM | POA: Diagnosis not present

## 2022-02-13 ENCOUNTER — Telehealth: Payer: Self-pay | Admitting: Cardiology

## 2022-02-13 ENCOUNTER — Encounter: Payer: Self-pay | Admitting: *Deleted

## 2022-02-13 MED ORDER — NITROGLYCERIN 0.4 MG SL SUBL: 0.4000 mg | SUBLINGUAL_TABLET | SUBLINGUAL | 3 refills | Status: DC | PRN

## 2022-02-13 NOTE — Telephone Encounter (Signed)
Refill complete. Will request cardiac test from [facility].  ?

## 2022-02-13 NOTE — Telephone Encounter (Signed)
Patient hasn't called pharmacy yet but, she needs a refill on nitroglycerin .'4mg'$  she hasn't had the need to take it in over 2 years but, she fears she may need because she was told she has CHF now wants to have it on hand just in case it's needed. She had cardiac testing done at [facility] she said we need to get the results.  She said she knew one was a echo, unsure of the other test. She is scheduled to see Gardiner Rhyme in 5 months.  Thank you Malanie.  ?

## 2022-02-18 DIAGNOSIS — C7802 Secondary malignant neoplasm of left lung: Secondary | ICD-10-CM | POA: Diagnosis not present

## 2022-02-18 DIAGNOSIS — C50412 Malignant neoplasm of upper-outer quadrant of left female breast: Secondary | ICD-10-CM | POA: Diagnosis not present

## 2022-02-18 DIAGNOSIS — Z171 Estrogen receptor negative status [ER-]: Secondary | ICD-10-CM | POA: Diagnosis not present

## 2022-02-18 DIAGNOSIS — C7801 Secondary malignant neoplasm of right lung: Secondary | ICD-10-CM | POA: Diagnosis not present

## 2022-02-18 DIAGNOSIS — Z09 Encounter for follow-up examination after completed treatment for conditions other than malignant neoplasm: Secondary | ICD-10-CM | POA: Diagnosis not present

## 2022-02-21 DIAGNOSIS — C7802 Secondary malignant neoplasm of left lung: Secondary | ICD-10-CM | POA: Diagnosis not present

## 2022-02-21 DIAGNOSIS — C50412 Malignant neoplasm of upper-outer quadrant of left female breast: Secondary | ICD-10-CM | POA: Diagnosis not present

## 2022-02-21 DIAGNOSIS — Z5111 Encounter for antineoplastic chemotherapy: Secondary | ICD-10-CM | POA: Diagnosis not present

## 2022-02-21 DIAGNOSIS — Z1159 Encounter for screening for other viral diseases: Secondary | ICD-10-CM | POA: Diagnosis not present

## 2022-02-21 DIAGNOSIS — C7801 Secondary malignant neoplasm of right lung: Secondary | ICD-10-CM | POA: Diagnosis not present

## 2022-02-21 DIAGNOSIS — Z171 Estrogen receptor negative status [ER-]: Secondary | ICD-10-CM | POA: Diagnosis not present

## 2022-02-25 DIAGNOSIS — I499 Cardiac arrhythmia, unspecified: Secondary | ICD-10-CM | POA: Diagnosis not present

## 2022-02-25 DIAGNOSIS — C799 Secondary malignant neoplasm of unspecified site: Secondary | ICD-10-CM | POA: Diagnosis not present

## 2022-02-25 DIAGNOSIS — J449 Chronic obstructive pulmonary disease, unspecified: Secondary | ICD-10-CM | POA: Diagnosis not present

## 2022-02-25 DIAGNOSIS — M1990 Unspecified osteoarthritis, unspecified site: Secondary | ICD-10-CM | POA: Diagnosis not present

## 2022-02-27 DIAGNOSIS — C787 Secondary malignant neoplasm of liver and intrahepatic bile duct: Secondary | ICD-10-CM | POA: Diagnosis not present

## 2022-03-07 DIAGNOSIS — Z5111 Encounter for antineoplastic chemotherapy: Secondary | ICD-10-CM | POA: Diagnosis not present

## 2022-03-07 DIAGNOSIS — I5030 Unspecified diastolic (congestive) heart failure: Secondary | ICD-10-CM | POA: Diagnosis not present

## 2022-03-07 DIAGNOSIS — E785 Hyperlipidemia, unspecified: Secondary | ICD-10-CM | POA: Diagnosis not present

## 2022-03-07 DIAGNOSIS — Z79899 Other long term (current) drug therapy: Secondary | ICD-10-CM | POA: Diagnosis not present

## 2022-03-07 DIAGNOSIS — C50912 Malignant neoplasm of unspecified site of left female breast: Secondary | ICD-10-CM | POA: Diagnosis not present

## 2022-03-07 DIAGNOSIS — I951 Orthostatic hypotension: Secondary | ICD-10-CM | POA: Diagnosis not present

## 2022-03-07 DIAGNOSIS — E1140 Type 2 diabetes mellitus with diabetic neuropathy, unspecified: Secondary | ICD-10-CM | POA: Diagnosis not present

## 2022-03-07 DIAGNOSIS — Z923 Personal history of irradiation: Secondary | ICD-10-CM | POA: Diagnosis not present

## 2022-03-07 DIAGNOSIS — C7802 Secondary malignant neoplasm of left lung: Secondary | ICD-10-CM | POA: Diagnosis not present

## 2022-03-07 DIAGNOSIS — J449 Chronic obstructive pulmonary disease, unspecified: Secondary | ICD-10-CM | POA: Diagnosis not present

## 2022-03-07 DIAGNOSIS — C7801 Secondary malignant neoplasm of right lung: Secondary | ICD-10-CM | POA: Diagnosis not present

## 2022-03-07 DIAGNOSIS — C787 Secondary malignant neoplasm of liver and intrahepatic bile duct: Secondary | ICD-10-CM | POA: Diagnosis not present

## 2022-03-07 DIAGNOSIS — Z7984 Long term (current) use of oral hypoglycemic drugs: Secondary | ICD-10-CM | POA: Diagnosis not present

## 2022-03-07 DIAGNOSIS — E11649 Type 2 diabetes mellitus with hypoglycemia without coma: Secondary | ICD-10-CM | POA: Diagnosis not present

## 2022-03-07 DIAGNOSIS — C7989 Secondary malignant neoplasm of other specified sites: Secondary | ICD-10-CM | POA: Diagnosis not present

## 2022-03-07 DIAGNOSIS — C50412 Malignant neoplasm of upper-outer quadrant of left female breast: Secondary | ICD-10-CM | POA: Diagnosis not present

## 2022-03-07 DIAGNOSIS — E1122 Type 2 diabetes mellitus with diabetic chronic kidney disease: Secondary | ICD-10-CM | POA: Diagnosis not present

## 2022-03-07 DIAGNOSIS — Z7901 Long term (current) use of anticoagulants: Secondary | ICD-10-CM | POA: Diagnosis not present

## 2022-03-07 DIAGNOSIS — Z171 Estrogen receptor negative status [ER-]: Secondary | ICD-10-CM | POA: Diagnosis not present

## 2022-03-07 DIAGNOSIS — N186 End stage renal disease: Secondary | ICD-10-CM | POA: Diagnosis not present

## 2022-03-07 DIAGNOSIS — I429 Cardiomyopathy, unspecified: Secondary | ICD-10-CM | POA: Diagnosis not present

## 2022-03-07 DIAGNOSIS — R768 Other specified abnormal immunological findings in serum: Secondary | ICD-10-CM | POA: Diagnosis not present

## 2022-03-12 ENCOUNTER — Telehealth: Payer: Self-pay | Admitting: Cardiology

## 2022-03-12 ENCOUNTER — Emergency Department (HOSPITAL_COMMUNITY): Payer: Medicare Other

## 2022-03-12 ENCOUNTER — Encounter (HOSPITAL_COMMUNITY): Payer: Self-pay | Admitting: *Deleted

## 2022-03-12 ENCOUNTER — Emergency Department (HOSPITAL_COMMUNITY)
Admission: EM | Admit: 2022-03-12 | Discharge: 2022-03-12 | Disposition: A | Discharge status: Home / Self Care | Payer: Medicare Other | Attending: Emergency Medicine | Admitting: Emergency Medicine

## 2022-03-12 ENCOUNTER — Other Ambulatory Visit: Payer: Self-pay

## 2022-03-12 DIAGNOSIS — Z711 Person with feared health complaint in whom no diagnosis is made: Secondary | ICD-10-CM | POA: Diagnosis not present

## 2022-03-12 DIAGNOSIS — E119 Type 2 diabetes mellitus without complications: Secondary | ICD-10-CM | POA: Insufficient documentation

## 2022-03-12 DIAGNOSIS — R079 Chest pain, unspecified: Secondary | ICD-10-CM | POA: Diagnosis not present

## 2022-03-12 DIAGNOSIS — C7989 Secondary malignant neoplasm of other specified sites: Secondary | ICD-10-CM | POA: Diagnosis not present

## 2022-03-12 DIAGNOSIS — C7802 Secondary malignant neoplasm of left lung: Secondary | ICD-10-CM | POA: Diagnosis not present

## 2022-03-12 DIAGNOSIS — Z171 Estrogen receptor negative status [ER-]: Secondary | ICD-10-CM | POA: Diagnosis not present

## 2022-03-12 DIAGNOSIS — Z79899 Other long term (current) drug therapy: Secondary | ICD-10-CM | POA: Insufficient documentation

## 2022-03-12 DIAGNOSIS — Z7984 Long term (current) use of oral hypoglycemic drugs: Secondary | ICD-10-CM | POA: Diagnosis not present

## 2022-03-12 DIAGNOSIS — R531 Weakness: Secondary | ICD-10-CM | POA: Diagnosis not present

## 2022-03-12 DIAGNOSIS — R252 Cramp and spasm: Secondary | ICD-10-CM | POA: Insufficient documentation

## 2022-03-12 DIAGNOSIS — R059 Cough, unspecified: Secondary | ICD-10-CM | POA: Diagnosis not present

## 2022-03-12 DIAGNOSIS — Z91040 Latex allergy status: Secondary | ICD-10-CM | POA: Diagnosis not present

## 2022-03-12 DIAGNOSIS — R110 Nausea: Secondary | ICD-10-CM | POA: Insufficient documentation

## 2022-03-12 DIAGNOSIS — Z5321 Procedure and treatment not carried out due to patient leaving prior to being seen by health care provider: Secondary | ICD-10-CM | POA: Diagnosis not present

## 2022-03-12 DIAGNOSIS — R0789 Other chest pain: Secondary | ICD-10-CM | POA: Diagnosis not present

## 2022-03-12 DIAGNOSIS — Z85118 Personal history of other malignant neoplasm of bronchus and lung: Secondary | ICD-10-CM | POA: Insufficient documentation

## 2022-03-12 DIAGNOSIS — I10 Essential (primary) hypertension: Secondary | ICD-10-CM | POA: Insufficient documentation

## 2022-03-12 DIAGNOSIS — Z7901 Long term (current) use of anticoagulants: Secondary | ICD-10-CM | POA: Insufficient documentation

## 2022-03-12 DIAGNOSIS — C50412 Malignant neoplasm of upper-outer quadrant of left female breast: Secondary | ICD-10-CM | POA: Diagnosis not present

## 2022-03-12 DIAGNOSIS — C7801 Secondary malignant neoplasm of right lung: Secondary | ICD-10-CM | POA: Diagnosis not present

## 2022-03-12 DIAGNOSIS — R768 Other specified abnormal immunological findings in serum: Secondary | ICD-10-CM | POA: Diagnosis not present

## 2022-03-12 DIAGNOSIS — R0602 Shortness of breath: Secondary | ICD-10-CM | POA: Diagnosis not present

## 2022-03-12 DIAGNOSIS — R911 Solitary pulmonary nodule: Secondary | ICD-10-CM | POA: Diagnosis not present

## 2022-03-12 HISTORY — DX: Malignant (primary) neoplasm, unspecified: C80.1

## 2022-03-12 LAB — CBC
HCT: 33.5 % — ABNORMAL LOW (ref 36.0–46.0)
Hemoglobin: 10.7 g/dL — ABNORMAL LOW (ref 12.0–15.0)
MCH: 26.4 pg (ref 26.0–34.0)
MCHC: 31.9 g/dL (ref 30.0–36.0)
MCV: 82.7 fL (ref 80.0–100.0)
Platelets: 315 10*3/uL (ref 150–400)
RBC: 4.05 MIL/uL (ref 3.87–5.11)
RDW: 15.3 % (ref 11.5–15.5)
WBC: 4.6 10*3/uL (ref 4.0–10.5)
nRBC: 0 % (ref 0.0–0.2)

## 2022-03-12 LAB — BASIC METABOLIC PANEL
Anion gap: 10 (ref 5–15)
BUN: 20 mg/dL (ref 8–23)
CO2: 22 mmol/L (ref 22–32)
Calcium: 8.6 mg/dL — ABNORMAL LOW (ref 8.9–10.3)
Chloride: 101 mmol/L (ref 98–111)
Creatinine, Ser: 1.49 mg/dL — ABNORMAL HIGH (ref 0.44–1.00)
GFR, Estimated: 37 mL/min — ABNORMAL LOW (ref >60)
Glucose, Bld: 165 mg/dL — ABNORMAL HIGH (ref 70–99)
Potassium: 3.7 mmol/L (ref 3.5–5.1)
Sodium: 133 mmol/L — ABNORMAL LOW (ref 135–145)

## 2022-03-12 LAB — TROPONIN I (HIGH SENSITIVITY)
Troponin I (High Sensitivity): 9 ng/L (ref <18)
Troponin I (High Sensitivity): 8 ng/L (ref <18)

## 2022-03-12 LAB — BRAIN NATRIURETIC PEPTIDE: B Natriuretic Peptide: 56 pg/mL (ref 0.0–100.0)

## 2022-03-12 MED ORDER — LACTATED RINGERS IV BOLUS
500.0000 mL | Freq: Once | INTRAVENOUS | Status: AC
Start: 2022-03-12 — End: 2022-03-12
  Administered 2022-03-12: 500 mL via INTRAVENOUS

## 2022-03-12 MED ORDER — HEPARIN SOD (PORK) LOCK FLUSH 100 UNIT/ML IV SOLN
500.0000 [IU] | Freq: Once | INTRAVENOUS | Status: AC
Start: 2022-03-12 — End: 2022-03-12
  Administered 2022-03-12: 500 [IU]
  Filled 2022-03-12: qty 5

## 2022-03-12 NOTE — Telephone Encounter (Signed)
Patient walked into office requesting evaluation for SOB and CP. See Care Everywhere note today from [facility].  ? ? ?I advised patient to go to the ED here at [facility] for evaluation. She has agreed to go. ?

## 2022-03-12 NOTE — Discharge Instructions (Addendum)
You were seen today for chest tightness. EKG and lab work shows no signs of new heart damage. Based on your history and with the tightness beginning with this chemo-therapy regimen, recommend discussing with your oncologist. Try to hydrate as possible. I recommend follow up with your PCP about your muscle cramps. Your potassium level was normal at today's visit. Return to the emergency department if you develop worsening chest pain, shortness of breath, or other life-threatening conditions ?

## 2022-03-12 NOTE — ED Triage Notes (Signed)
Pt went to her appointment at cancer center and was told to come to ED to be seen because she may need fluids; pt c/o sob x 1 week;  ?

## 2022-03-12 NOTE — Telephone Encounter (Signed)
Patient is calling stating the cancer center sent her to [facility] hospital to have an EKG. Patient states there was two people in front of her with chest pain so she ended up leaving instead of waiting. She is requesting to come in today to have an EKG performed. Patient did not complain of any active symptoms at time of call. Please advise.  ?

## 2022-03-12 NOTE — ED Provider Notes (Signed)
?[facility] ?Provider Note ? ? ?CSN: [id number] ?Arrival date & time: 03/12/22  1348 ? ?  ? ?History ? ?Chief Complaint  ?Patient presents with  ? Shortness of Breath  ? ? ?Kelli Wilson is a 71 y.o. female.  Patient presents to the hospital complaining of chest tightness, cough, muscle cramps, and nausea.  Patient was at the cancer center earlier today for lab work and chemotherapy and had similar complaints and it was recommended that she come to the emergency department for further evaluation.  Patient currently endorses chest tightness in the left side of her chest rated 2 out of 10 while lying still, and states that it increases with exertion.  Patient has a cough which started in the past few days.  Patient states that she has shortness of breath with exertion with mild shortness of breath with rest.  Patient complains of muscle cramps in her back shoulders that began last night.  Patient had nausea while at the cancer center and was treated with fluid and possibly medication and feels better at the current moment.  Patient denies vomiting, denies abdominal pain, denies urinary symptoms.  PMH significant for modified radical mastectomy left, byssinosis, PSVT, DM, GERD, fibromyalgia, peptic ulcer disease, hypertension, chronic pain, reported stage IV lung cancer, ejection fraction left ventricle 28% ? ?HPI ? ?  ? ?Home Medications ?Prior to Admission medications   ?Medication Sig Start Date End Date Taking? Authorizing Provider  ?ALPRAZolam (XANAX) 0.5 MG tablet Take by mouth. 08/19/18  Yes [provider]  ?calcitRIOL (ROCALTROL) 0.25 MCG capsule TAKE ONE CAPSULE BY MOUTH 3 TIMES A WEEK ON MONDAY, WEDNESDAY AND FRIDAY. 12/11/21  Yes [provider]  ?hydrOXYzine (ATARAX) 50 MG tablet Take by mouth. 08/05/18  Yes [provider]  ?metFORMIN (GLUCOPHAGE) 500 MG tablet Take by mouth. 06/08/18  Yes [provider]  ?omeprazole (PRILOSEC) 40 MG capsule Take by  mouth. 08/06/18  Yes [provider]  ?ondansetron (ZOFRAN) 8 MG tablet ondansetron HCl 8 mg tablet ? TAKE ONE TABLET BY MOUTH EVERY 8 HOURS AS NEEDED FOR NAUSEA 02/14/22  Yes [provider]  ?ALPRAZolam (XANAX) 0.5 MG tablet Take 0.5 mg by mouth 4 (four) times daily as needed for anxiety.     [provider]  ?FARXIGA 10 MG TABS tablet Take 10 mg by mouth daily. 02/22/22   [provider]  ?FARXIGA 5 MG TABS tablet Take 5 mg by mouth daily. 09/09/20   [provider]  ?glipiZIDE (GLUCOTROL XL) 5 MG 24 hr tablet Take 5 mg by mouth 2 (two) times daily. 02/22/22   [provider]  ?HYDROcodone-acetaminophen ([place]) 7.5-325 MG tablet TAKE ONE TABLET BY MOUTH FOUR TIMES DAILY AS NEEDED FOR PAIN. 08/19/18   [provider]  ?hydrOXYzine (ATARAX/VISTARIL) 50 MG tablet Take 50 mg by mouth every other day. Every other day at bedtime and as needed    [provider]  ?magnesium oxide (MAG-OX) 400 (240 Mg) MG tablet Take 1 tablet by mouth daily. 02/22/22   [provider]  ?magnesium oxide (MAG-OX) 400 (241.3 Mg) MG tablet Take 1 tablet (400 mg total) by mouth 3 (three) times daily between meals. ?Patient taking differently: Take 400 mg by mouth daily. 04/02/18   Lucia Gaskins, MD  ?metFORMIN (GLUCOPHAGE) 500 MG tablet Take 500 mg by mouth 2 (two) times daily with a meal.  ?Patient not taking: Reported on 10/12/2021    [provider]  ?metoprolol succinate (TOPROL-XL) 100  MG 24 hr tablet Take 1 tablet (100 mg total) by mouth daily. Take with or immediately following a meal. 04/02/18   Lucia Gaskins, MD  ?nitroGLYCERIN (NITROSTAT) 0.4 MG SL tablet Place 1 tablet (0.4 mg total) under the tongue every 5 (five) minutes as needed for chest pain. 02/13/22   Donato Heinz, MD  ?nitroGLYCERIN (NITROSTAT) 0.4 MG SL tablet Place under the tongue.    [provider]  ?omeprazole (PRILOSEC) 40 MG capsule Take 40 mg every other day  by mouth. At night    [provider]  ?pravastatin (PRAVACHOL) 40 MG tablet Take 40 mg by mouth daily.    [provider]  ?rivaroxaban (XARELTO) 10 MG TABS tablet Take 1 tablet by mouth daily. 05/02/20   [provider]  ?Semaglutide (RYBELSUS) 7 MG TABS Rybelsus 7 mg tablet ? Take 1 tablet every day by oral route.    [provider]  ?   ? ?Allergies    ?Magnesium sulfate, Other, Cortisone, Iohexol, Ivp dye [iodinated contrast media], Januvia [sitagliptin], Latex, Motrin [ibuprofen], Nsaids, Statins, Sulfa antibiotics, and Adhesive [tape]   ? ?Review of Systems   ?Review of Systems  ?Constitutional:  Negative for fever.  ?Respiratory:  Positive for cough and chest tightness. Negative for shortness of breath.   ?Gastrointestinal:  Positive for nausea. Negative for abdominal pain and vomiting.  ?Musculoskeletal:   ?     Back and shoulder cramping  ? ?Physical Exam ?Updated Vital Signs ?BP (!) 115/56   Pulse 80   Temp (!) 97.5 ?F (36.4 ?C) (Oral)   Resp (!) 21   Ht 5' 7.5" (1.715 m)   Wt 124.7 kg   SpO2 98%   BMI 42.44 kg/m?  ?Physical Exam ?Vitals and nursing note reviewed.  ?Constitutional:   ?   General: She is not in acute distress. ?HENT:  ?   Head: Normocephalic.  ?   Mouth/Throat:  ?   Mouth: Mucous membranes are moist.  ?Eyes:  ?   Pupils: Pupils are equal, round, and reactive to light.  ?Cardiovascular:  ?   Rate and Rhythm: Normal rate and regular rhythm.  ?   Pulses: Normal pulses.  ?Pulmonary:  ?   Effort: Pulmonary effort is normal.  ?   Breath sounds: Examination of the left-upper field reveals rhonchi. Rhonchi present.  ?Chest:  ?   Chest wall: No tenderness.  ?Abdominal:  ?   Palpations: Abdomen is soft.  ?   Tenderness: There is no abdominal tenderness.  ?Musculoskeletal:  ?   Cervical back: Normal range of motion and neck supple.  ?Skin: ?   General: Skin is warm and dry.  ?Neurological:  ?   Mental Status: She is alert and oriented to person, place, and  time.  ? ? ?ED Results / Procedures / Treatments   ?Labs ?(all labs ordered are listed, but only abnormal results are displayed) ?Labs Reviewed  ?BASIC METABOLIC PANEL - Abnormal; Notable for the following components:  ?    Result Value  ? Sodium 133 (*)   ? Glucose, Bld 165 (*)   ? Creatinine, Ser 1.49 (*)   ? Calcium 8.6 (*)   ? GFR, Estimated 37 (*)   ? All other components within normal limits  ?CBC - Abnormal; Notable for the following components:  ? Hemoglobin 10.7 (*)   ? HCT 33.5 (*)   ? All other components within normal limits  ?BRAIN NATRIURETIC PEPTIDE  ?TROPONIN I (HIGH SENSITIVITY)  ?  TROPONIN I (HIGH SENSITIVITY)  ? ? ?EKG ?None ? ?Radiology ?DG Chest Port 1 View ? ?Result Date: 03/12/2022 ?CLINICAL DATA:  Chest pain and weakness. EXAM: PORTABLE CHEST 1 VIEW COMPARISON:  May of 2019 and CT of the chest of January of 2023. FINDINGS: Kg leads project over the chest. RIGHT-sided Port-A-Cath terminates in the area of the distal superior vena cava. Heart size is stable. Central pulmonary vasculature and hilar structures unremarkable. Pulmonary nodule in the LEFT upper lobe at approximately 14 mm better seen on prior chest CT. Other pulmonary nodules not visualized on the current study. No lobar consolidation. No sign of pleural effusion or pneumothorax. On limited assessment there is no acute skeletal process. IMPRESSION: 1. LEFT upper lobe pulmonary nodule better seen on prior chest CT. Other pulmonary nodules not visualized on the current study. 2. No acute cardiopulmonary disease. Electronically Signed   By: Zetta Bills M.D.   On: 03/12/2022 14:52   ? ?Procedures ?Procedures  ? ? ?Medications Ordered in ED ?Medications  ?lactated ringers bolus 500 mL (0 mLs Intravenous Stopped 03/12/22 1726)  ? ? ?ED Course/ Medical Decision Making/ A&P ?  ?                        ?Medical Decision Making ?Amount and/or Complexity of Data Reviewed ?Labs: ordered. ?Radiology: ordered. ? ? ?This patient presents to the ED  for concern of chest tightness, this involves an extensive number of treatment options, and is a complaint that carries with it a high risk of complications and morbidity.  The differential diagnosis includes

## 2022-03-12 NOTE — Telephone Encounter (Signed)
Left message to return call ? ? ?Sent by [facility] cancer center to [facility] ED for SOB, CP ? ? ?Patient did not want to stay to be evaluated. ? ? ?Will advise patient to go to ED as we cannot work her up in office with merely an EKG. ? ? ?

## 2022-03-21 DIAGNOSIS — C50919 Malignant neoplasm of unspecified site of unspecified female breast: Secondary | ICD-10-CM | POA: Diagnosis not present

## 2022-03-21 DIAGNOSIS — C50412 Malignant neoplasm of upper-outer quadrant of left female breast: Secondary | ICD-10-CM | POA: Diagnosis not present

## 2022-03-21 DIAGNOSIS — C7801 Secondary malignant neoplasm of right lung: Secondary | ICD-10-CM | POA: Diagnosis not present

## 2022-03-21 DIAGNOSIS — C7802 Secondary malignant neoplasm of left lung: Secondary | ICD-10-CM | POA: Diagnosis not present

## 2022-03-25 DIAGNOSIS — R7301 Impaired fasting glucose: Secondary | ICD-10-CM | POA: Diagnosis not present

## 2022-03-25 DIAGNOSIS — I129 Hypertensive chronic kidney disease with stage 1 through stage 4 chronic kidney disease, or unspecified chronic kidney disease: Secondary | ICD-10-CM | POA: Diagnosis not present

## 2022-03-27 DIAGNOSIS — J449 Chronic obstructive pulmonary disease, unspecified: Secondary | ICD-10-CM | POA: Diagnosis not present

## 2022-03-27 DIAGNOSIS — C799 Secondary malignant neoplasm of unspecified site: Secondary | ICD-10-CM | POA: Diagnosis not present

## 2022-03-27 DIAGNOSIS — I499 Cardiac arrhythmia, unspecified: Secondary | ICD-10-CM | POA: Diagnosis not present

## 2022-03-27 DIAGNOSIS — M1990 Unspecified osteoarthritis, unspecified site: Secondary | ICD-10-CM | POA: Diagnosis not present

## 2022-04-01 DIAGNOSIS — I129 Hypertensive chronic kidney disease with stage 1 through stage 4 chronic kidney disease, or unspecified chronic kidney disease: Secondary | ICD-10-CM | POA: Diagnosis not present

## 2022-04-01 DIAGNOSIS — N189 Chronic kidney disease, unspecified: Secondary | ICD-10-CM | POA: Diagnosis not present

## 2022-04-01 DIAGNOSIS — E8722 Chronic metabolic acidosis: Secondary | ICD-10-CM | POA: Diagnosis not present

## 2022-04-01 DIAGNOSIS — E211 Secondary hyperparathyroidism, not elsewhere classified: Secondary | ICD-10-CM | POA: Diagnosis not present

## 2022-04-01 DIAGNOSIS — E1122 Type 2 diabetes mellitus with diabetic chronic kidney disease: Secondary | ICD-10-CM | POA: Diagnosis not present

## 2022-04-02 DIAGNOSIS — E1122 Type 2 diabetes mellitus with diabetic chronic kidney disease: Secondary | ICD-10-CM | POA: Diagnosis not present

## 2022-04-02 DIAGNOSIS — I129 Hypertensive chronic kidney disease with stage 1 through stage 4 chronic kidney disease, or unspecified chronic kidney disease: Secondary | ICD-10-CM | POA: Diagnosis not present

## 2022-04-02 DIAGNOSIS — C50912 Malignant neoplasm of unspecified site of left female breast: Secondary | ICD-10-CM | POA: Diagnosis not present

## 2022-04-02 DIAGNOSIS — C7802 Secondary malignant neoplasm of left lung: Secondary | ICD-10-CM | POA: Diagnosis not present

## 2022-04-02 DIAGNOSIS — K219 Gastro-esophageal reflux disease without esophagitis: Secondary | ICD-10-CM | POA: Diagnosis not present

## 2022-04-02 DIAGNOSIS — I471 Supraventricular tachycardia: Secondary | ICD-10-CM | POA: Diagnosis not present

## 2022-04-02 DIAGNOSIS — G894 Chronic pain syndrome: Secondary | ICD-10-CM | POA: Diagnosis not present

## 2022-04-02 DIAGNOSIS — J449 Chronic obstructive pulmonary disease, unspecified: Secondary | ICD-10-CM | POA: Diagnosis not present

## 2022-04-02 DIAGNOSIS — C7801 Secondary malignant neoplasm of right lung: Secondary | ICD-10-CM | POA: Diagnosis not present

## 2022-04-02 DIAGNOSIS — E782 Mixed hyperlipidemia: Secondary | ICD-10-CM | POA: Diagnosis not present

## 2022-04-02 DIAGNOSIS — J302 Other seasonal allergic rhinitis: Secondary | ICD-10-CM | POA: Diagnosis not present

## 2022-04-10 DIAGNOSIS — I5042 Chronic combined systolic (congestive) and diastolic (congestive) heart failure: Secondary | ICD-10-CM | POA: Diagnosis not present

## 2022-04-10 DIAGNOSIS — E211 Secondary hyperparathyroidism, not elsewhere classified: Secondary | ICD-10-CM | POA: Diagnosis not present

## 2022-04-10 DIAGNOSIS — D638 Anemia in other chronic diseases classified elsewhere: Secondary | ICD-10-CM | POA: Diagnosis not present

## 2022-04-10 DIAGNOSIS — N189 Chronic kidney disease, unspecified: Secondary | ICD-10-CM | POA: Diagnosis not present

## 2022-04-10 DIAGNOSIS — E1122 Type 2 diabetes mellitus with diabetic chronic kidney disease: Secondary | ICD-10-CM | POA: Diagnosis not present

## 2022-04-10 DIAGNOSIS — I129 Hypertensive chronic kidney disease with stage 1 through stage 4 chronic kidney disease, or unspecified chronic kidney disease: Secondary | ICD-10-CM | POA: Diagnosis not present

## 2022-04-15 ENCOUNTER — Ambulatory Visit: Payer: Medicare Other | Admitting: Cardiology

## 2022-04-15 ENCOUNTER — Encounter: Payer: Self-pay | Admitting: Cardiology

## 2022-04-15 VITALS — BP 138/88 | HR 80 | Ht 67.5 in | Wt 275.4 lb

## 2022-04-15 DIAGNOSIS — I5042 Chronic combined systolic (congestive) and diastolic (congestive) heart failure: Secondary | ICD-10-CM

## 2022-04-15 DIAGNOSIS — I471 Supraventricular tachycardia: Secondary | ICD-10-CM

## 2022-04-15 DIAGNOSIS — I824Z1 Acute embolism and thrombosis of unspecified deep veins of right distal lower extremity: Secondary | ICD-10-CM | POA: Diagnosis not present

## 2022-04-15 NOTE — Progress Notes (Signed)
Cardiology Office Note:    Date:  04/15/2022   ID:  TEQUILA ROTTMANN, DOB 14-Sep-1951, MRN [id number]  PCP:  Celene Squibb, MD  Cardiologist:  Kate Sable, MD (Inactive)  Electrophysiologist:  None   Referring MD: Celene Squibb, MD   Chief Complaint  Patient presents with   Congestive Heart Failure     History of Present Illness:    Kelli Wilson is a 71 y.o. female with a hx of HFrEF, SVT, T2DM, hypertension, hyperlipidemia, metastatic breast cancer, fibromyalgia, DVT who presents for follow-up.  Coronary angiogram on 04/2004 was normal.  Nuclear stress test 02/2010 was normal.  Echocardiogram 03/2018 showed EF 55 to 76%, grade 1 diastolic dysfunction.  Most recent echo 01/18/2020 at [facility] showed normal biventricular function, no significant valvular disease.  Cardiac monitor on 05/06/2018 was worn for 1 day, no arrhythmias.  Cr 1.75 on 04/01/22.    Since last clinic visit, she was found to have progression of her breast cancer, with metastatic disease to the lungs.  She underwent echocardiogram on 12/31/2021 which showed EF 30 to 35%.  MUGA scan showed EF 28%.  She was tried on Taxol but had hypersensitivity reaction.  She stated she does not want any more chemotherapy.  Had chest pain when she received Taxol.  Reports has been feeling short of breath.  Weight has been stable, down 6 pounds from last clinic visit.  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 dx 03/2018 - long RP likely  atrial tachycardia.    Past Surgical History:  Procedure Laterality Date   APPENDECTOMY     bladder rectal repair     BREAST SURGERY Left    breast biopsy- benign   CHOLECYSTECTOMY     DILATION AND CURETTAGE OF UTERUS     GANGLION CYST EXCISION Left    MASTECTOMY MODIFIED RADICAL Left 09/15/2017   Procedure: LEFT MODIFIED RADICAL MASTECTOMY;  Surgeon: Aviva Signs, MD;  Location: [facility];  Service: General;  Laterality: Left;   PORTACATH PLACEMENT Right 10/13/2017   Procedure: INSERTION PORT-A-CATH RIGHT SUBCLAVIAN;  Surgeon: Aviva Signs, MD;  Location: [facility];  Service: General;  Laterality: Right;   TOTAL ABDOMINAL HYSTERECTOMY      Current Medications: Current Meds  Medication Sig   ALPRAZolam (XANAX) 0.5 MG tablet Take 0.5 mg by mouth 4 (four) times daily as needed for anxiety.    calcitRIOL (ROCALTROL) 0.25 MCG capsule TAKE ONE CAPSULE BY MOUTH 3 TIMES A WEEK ON MONDAY, WEDNESDAY AND FRIDAY.   FARXIGA 10 MG TABS tablet Take 10 mg by mouth daily.   glipiZIDE (GLUCOTROL XL) 5 MG 24 hr tablet Take 5 mg by mouth 2 (two) times daily.   HYDROcodone-acetaminophen ([place]) 7.5-325 MG tablet TAKE ONE TABLET BY MOUTH FOUR TIMES DAILY AS NEEDED  FOR PAIN.   hydrOXYzine (ATARAX) 50 MG tablet Take by mouth.   magnesium oxide (MAG-OX) 400 (241.3 Mg) MG tablet Take 1 tablet (400 mg total) by mouth 3 (three) times daily between meals. (Patient taking differently: Take 400 mg by mouth daily.)   metoprolol succinate (TOPROL-XL) 100 MG 24 hr tablet Take 1 tablet (100 mg total) by mouth daily. Take with or immediately following a meal.   nitroGLYCERIN (NITROSTAT) 0.4 MG SL tablet Place 1 tablet (0.4 mg total) under the tongue every 5 (five) minutes as needed for chest pain.   omeprazole (PRILOSEC) 40 MG capsule Take 40 mg every other day by mouth. At night   ondansetron (ZOFRAN) 8 MG tablet ondansetron HCl 8 mg tablet  TAKE ONE TABLET BY MOUTH EVERY 8 HOURS AS NEEDED FOR NAUSEA   pravastatin  (PRAVACHOL) 40 MG tablet Take 40 mg by mouth daily.   rivaroxaban (XARELTO) 10 MG TABS tablet Take 1 tablet by mouth daily.     Allergies:   Magnesium sulfate, Other, Cortisone, Iohexol, Ivp dye [iodinated contrast media], Januvia [sitagliptin], Latex, Motrin [ibuprofen], Nsaids, Statins, Sulfa antibiotics, and Adhesive [tape]   Social History   Socioeconomic History   Marital status: Married    Spouse name: Not on file   Number of children: Not on file   Years of education: Not on file   Highest education level: Not on file  Occupational History   Occupation: disabled    Comment: disabled from a Equities trader (Byssinosis)  Tobacco Use   Smoking status: Never   Smokeless tobacco: Current    Types: Snuff  Vaping Use   Vaping Use: Never used  Substance and Sexual Activity   Alcohol use: No   Drug use: No   Sexual activity: Never    Birth control/protection: Surgical  Other Topics Concern   Not on file  Social History Narrative   Not on file   Social Determinants of Health   Financial Resource Strain: Not on file  Food Insecurity: Not on file  Transportation Needs: Not on file  Physical Activity: Not on file  Stress: Not on file  Social Connections: Not on file     Family History: The patient's family history includes Aneurysm in her father; COPD in her mother; Diabetes in her mother; Heart disease in her father.  ROS:   Please see the history of present illness.     All other systems reviewed and are negative.  EKGs/Labs/Other Studies Reviewed:    The following studies were reviewed today:   EKG:  EKG is  ordered today.  The ekg ordered today demonstrates normal sinus rhythm, right axis deviation, nonspecific intraventricular conduction delay, T wave inversions in the inferior leads and V6  Recent Labs: 03/12/2022: B Natriuretic Peptide 56.0; BUN 20; Creatinine, Ser 1.49; Hemoglobin 10.7; Platelets 315; Potassium 3.7; Sodium 133  Recent Lipid Panel    Component  Value Date/Time   CHOL 166 02/16/2018 1122   TRIG 196 (H) 02/16/2018 1122   HDL 50 02/16/2018 1122   CHOLHDL 3.3 02/16/2018 1122   VLDL 39 02/16/2018 1122   LDLCALC 77 02/16/2018 1122    Physical Exam:    VS:  BP 138/88   Pulse 80   Ht 5' 7.5" (1.715 m)   Wt 275 lb 6.4 oz (124.9 kg)   SpO2 96%   BMI 42.50 kg/m     Wt Readings from Last 3 Encounters:  04/15/22 275 lb 6.4 oz (124.9 kg)  03/12/22 275  lb (124.7 kg)  10/12/21 281 lb (127.5 kg)     GEN:  Well nourished, well developed in no acute distress HEENT: Normal NECK: No JVD; No carotid bruits LYMPHATICS: No lymphadenopathy CARDIAC: RRR, no murmurs, rubs, gallops RESPIRATORY:  Clear to auscultation without rales, wheezing or rhonchi  ABDOMEN: Soft, non-tender, non-distended MUSCULOSKELETAL:  No edema; No deformity  SKIN: Warm and dry NEUROLOGIC:  Alert and oriented x 3 PSYCHIATRIC:  Normal affect   ASSESSMENT:    1. Chronic combined systolic and diastolic heart failure ([place])   2. PSVT (paroxysmal supraventricular tachycardia) ([place])   3. Deep vein thrombosis (DVT) of distal vein of right lower extremity, unspecified chronicity (H[facility])     PLAN:    Chronic combined systolic and diastolic heart failure: EF 30 to 35% on echo 12/31/2021.  She was recently found to have metastatic breast cancer and is declining further treatment.  She declines any further work-up of her cardiomyopathy at this time.  Does not want to repeat echocardiogram for monitoring systolic function and does not want stress test done to evaluate for ischemia as a cause of her cardiomyopathy -Continue Toprol-XL 100 mg daily -Continue Farxiga 10 mg daily -Hold off on ACE/ARB/ARNI and spironolactone given her kidney function (creatinine up to 1.75 on 04/01/2022) -Appears euvolemic.  Recommend monitoring daily weights and call if gains more than 3 pounds in 1 day or 5 pounds in 1 week  Metastatic breast cancer: Follows with oncology.  Recently had  hypersensitivity reaction to Taxol.  Reports she does not want further treatment  PSVT/atrial tachycardia: Continue Toprol-XL 100 mg daily   Right leg DVT: On Xarelto.        Medication Adjustments/Labs and Tests Ordered: Current medicines are reviewed at length with the patient today.  Concerns regarding medicines are outlined above.  No orders of the defined types were placed in this encounter.  No orders of the defined types were placed in this encounter.    Patient Instructions  Medication Instructions:  Your physician recommends that you continue on your current medications as directed. Please refer to the Current Medication list given to you today.   Labwork: None  Testing/Procedures: None  Follow-Up: Follow up with Dr. Gardiner Rhyme in 6 months.  Any Other Special Instructions Will Be Listed Below (If Applicable).  Please weigh yourself daily. If you gain 3 lbs overnight or 5 lbs in a week, please give our office a call.    If you need a refill on your cardiac medications before your next appointment, please call your pharmacy.    Signed, Donato Heinz, MD  04/15/2022 1:28 PM    [facility]

## 2022-04-15 NOTE — Patient Instructions (Signed)
Medication Instructions:  Your physician recommends that you continue on your current medications as directed. Please refer to the Current Medication list given to you today.   Labwork: None  Testing/Procedures: None  Follow-Up: Follow up with Dr. Gardiner Rhyme in 6 months.  Any Other Special Instructions Will Be Listed Below (If Applicable).  Please weigh yourself daily. If you gain 3 lbs overnight or 5 lbs in a week, please give our office a call.    If you need a refill on your cardiac medications before your next appointment, please call your pharmacy.

## 2022-04-19 DIAGNOSIS — C50412 Malignant neoplasm of upper-outer quadrant of left female breast: Secondary | ICD-10-CM | POA: Diagnosis not present

## 2022-04-19 DIAGNOSIS — G893 Neoplasm related pain (acute) (chronic): Secondary | ICD-10-CM | POA: Diagnosis not present

## 2022-04-19 DIAGNOSIS — K529 Noninfective gastroenteritis and colitis, unspecified: Secondary | ICD-10-CM | POA: Diagnosis not present

## 2022-04-19 DIAGNOSIS — C7801 Secondary malignant neoplasm of right lung: Secondary | ICD-10-CM | POA: Diagnosis not present

## 2022-04-19 DIAGNOSIS — C7802 Secondary malignant neoplasm of left lung: Secondary | ICD-10-CM | POA: Diagnosis not present

## 2022-04-19 DIAGNOSIS — C50919 Malignant neoplasm of unspecified site of unspecified female breast: Secondary | ICD-10-CM | POA: Diagnosis not present

## 2022-04-27 DIAGNOSIS — C799 Secondary malignant neoplasm of unspecified site: Secondary | ICD-10-CM | POA: Diagnosis not present

## 2022-04-27 DIAGNOSIS — I499 Cardiac arrhythmia, unspecified: Secondary | ICD-10-CM | POA: Diagnosis not present

## 2022-04-27 DIAGNOSIS — M1990 Unspecified osteoarthritis, unspecified site: Secondary | ICD-10-CM | POA: Diagnosis not present

## 2022-04-27 DIAGNOSIS — J449 Chronic obstructive pulmonary disease, unspecified: Secondary | ICD-10-CM | POA: Diagnosis not present

## 2022-05-27 DIAGNOSIS — J449 Chronic obstructive pulmonary disease, unspecified: Secondary | ICD-10-CM | POA: Diagnosis not present

## 2022-05-27 DIAGNOSIS — M1990 Unspecified osteoarthritis, unspecified site: Secondary | ICD-10-CM | POA: Diagnosis not present

## 2022-05-27 DIAGNOSIS — I499 Cardiac arrhythmia, unspecified: Secondary | ICD-10-CM | POA: Diagnosis not present

## 2022-05-27 DIAGNOSIS — C799 Secondary malignant neoplasm of unspecified site: Secondary | ICD-10-CM | POA: Diagnosis not present

## 2022-06-27 DIAGNOSIS — J449 Chronic obstructive pulmonary disease, unspecified: Secondary | ICD-10-CM | POA: Diagnosis not present

## 2022-06-27 DIAGNOSIS — M1990 Unspecified osteoarthritis, unspecified site: Secondary | ICD-10-CM | POA: Diagnosis not present

## 2022-06-27 DIAGNOSIS — I499 Cardiac arrhythmia, unspecified: Secondary | ICD-10-CM | POA: Diagnosis not present

## 2022-06-27 DIAGNOSIS — C799 Secondary malignant neoplasm of unspecified site: Secondary | ICD-10-CM | POA: Diagnosis not present

## 2023-02-24 ENCOUNTER — Encounter: Payer: Self-pay | Admitting: *Deleted

## 2023-02-24 ENCOUNTER — Ambulatory Visit: Payer: Medicare Other | Admitting: *Deleted

## 2023-02-24 NOTE — Patient Instructions (Signed)
Visit Information  Thank you for taking time to visit with me today. Please don't hesitate to contact me if I can be of assistance to you.   Please call the care guide team at [phone] if you need to cancel or reschedule your appointment.   If you are experiencing a Mental Health or Behavioral Health Crisis or need someone to talk to, please call the Suicide and Crisis Lifeline: 988 call the [place] National Suicide Prevention Lifeline: 1-800-273-8255 or TTY: 1-800-799-4 TTY (1-800-799-4889) to talk to a trained counselor call 1-800-273-TALK (toll free, 24 hour hotline) go to [facility] [address], [place] ([phone]) call the [place] Crisis Line: 800-939-9988 call 911  Patient verbalizes understanding of instructions and care plan provided today and agrees to view in MyChart. Active MyChart status and patient understanding of how to access instructions and care plan via MyChart confirmed with patient.     No further follow up required.  Lajuane Leatham, BSW, MSW, LCSW  Licensed Clinical Social Worker  [facility] [place] [facility]  Mailing Address-[address], [place], [place] [address] Physical Address-[address], [place], [place] [address] Toll Free Main # [phone] Fax # [phone] Cell # [phone] [email or web address]            

## 2023-02-24 NOTE — Patient Outreach (Signed)
  Care Coordination   Initial Visit Note   02/24/2023  Name: Kelli Wilson MRN: [id number] DOB: [birth year]  Kelli Wilson is a 72 y.o. year old female who sees Nevada Crane, Edwinna Areola, MD for primary care. I spoke with daughter, Kelli Wilson by phone today.  What matters to the patients health and wellness today?  No interventions identified.  Daughter denies need for social work involvement at this time.  Patient is currently under Hospice care.  SDOH assessments and interventions completed:  Yes.  SDOH Interventions Today    Flowsheet Row Most Recent Value  SDOH Interventions   Food Insecurity Interventions Intervention Not Indicated  [Verified by Daughter, [place] Interventions Intervention Not Indicated  [Verified by Daughter, Patent examiner Johnson]  Transportation Interventions Intervention Not Indicated, Patient Resources (Friends/Family), Payor Benefit  [Verified by Daughter, Patent examiner Johnson]  Utilities Interventions Intervention Not Indicated  [Verified by Daughter, Patent examiner Johnson]  Alcohol Usage Interventions Intervention Not Indicated (Score <7)  [Verified by Daughter, Corporate treasurer Strain Interventions Intervention Not Indicated  [Verified by Daughter, Patent examiner Johnson]  Physical Activity Interventions Intervention Not Indicated  [Verified by Daughter, Patent examiner Johnson]  Stress Interventions Intervention Not Indicated  [Verified by Daughter, Patent examiner Johnson]  Social Connections Interventions Intervention Not Indicated  [Verified by Daughter, Alice Johnson]     Care Coordination Interventions:  No, not indicated.   Follow up plan: No further intervention required.   Encounter Outcome:  Pt. Visit Completed.   Kelli Wilson, BSW, MSW, LCSW  Licensed Education officer, [facility]  Mailing [place] [address], [place], [place] [address] Physical Address-[address] [place], [place] [address] Toll Free Main #  [phone] Fax # [phone] Cell # [phone] [email or web address]

## 2023-04-25 DIAGNOSIS — F17220 Nicotine dependence, chewing tobacco, uncomplicated: Secondary | ICD-10-CM | POA: Diagnosis not present

## 2023-04-25 DIAGNOSIS — M79604 Pain in right leg: Secondary | ICD-10-CM | POA: Diagnosis not present

## 2023-04-25 DIAGNOSIS — Z515 Encounter for palliative care: Secondary | ICD-10-CM | POA: Diagnosis not present

## 2023-04-25 DIAGNOSIS — Z91041 Radiographic dye allergy status: Secondary | ICD-10-CM | POA: Diagnosis not present

## 2023-04-25 DIAGNOSIS — M1990 Unspecified osteoarthritis, unspecified site: Secondary | ICD-10-CM | POA: Diagnosis not present

## 2023-04-25 DIAGNOSIS — Z7984 Long term (current) use of oral hypoglycemic drugs: Secondary | ICD-10-CM | POA: Diagnosis not present

## 2023-04-25 DIAGNOSIS — Z7902 Long term (current) use of antithrombotics/antiplatelets: Secondary | ICD-10-CM | POA: Diagnosis not present

## 2023-04-25 DIAGNOSIS — E785 Hyperlipidemia, unspecified: Secondary | ICD-10-CM | POA: Diagnosis not present

## 2023-04-25 DIAGNOSIS — C50919 Malignant neoplasm of unspecified site of unspecified female breast: Secondary | ICD-10-CM | POA: Diagnosis not present

## 2023-04-25 DIAGNOSIS — M79662 Pain in left lower leg: Secondary | ICD-10-CM | POA: Diagnosis not present

## 2023-04-25 DIAGNOSIS — M79661 Pain in right lower leg: Secondary | ICD-10-CM | POA: Diagnosis not present

## 2023-04-25 DIAGNOSIS — M797 Fibromyalgia: Secondary | ICD-10-CM | POA: Diagnosis not present

## 2023-04-25 DIAGNOSIS — I82401 Acute embolism and thrombosis of unspecified deep veins of right lower extremity: Secondary | ICD-10-CM | POA: Diagnosis not present

## 2023-04-25 DIAGNOSIS — Z91040 Latex allergy status: Secondary | ICD-10-CM | POA: Diagnosis not present

## 2023-04-25 DIAGNOSIS — G43909 Migraine, unspecified, not intractable, without status migrainosus: Secondary | ICD-10-CM | POA: Diagnosis not present

## 2023-04-25 DIAGNOSIS — J449 Chronic obstructive pulmonary disease, unspecified: Secondary | ICD-10-CM | POA: Diagnosis not present

## 2023-04-25 DIAGNOSIS — Z886 Allergy status to analgesic agent status: Secondary | ICD-10-CM | POA: Diagnosis not present

## 2023-04-25 DIAGNOSIS — Z882 Allergy status to sulfonamides status: Secondary | ICD-10-CM | POA: Diagnosis not present

## 2023-04-25 DIAGNOSIS — F32A Depression, unspecified: Secondary | ICD-10-CM | POA: Diagnosis not present

## 2023-04-25 DIAGNOSIS — E119 Type 2 diabetes mellitus without complications: Secondary | ICD-10-CM | POA: Diagnosis not present

## 2023-04-29 DIAGNOSIS — Z888 Allergy status to other drugs, medicaments and biological substances status: Secondary | ICD-10-CM | POA: Diagnosis not present

## 2023-04-29 DIAGNOSIS — E559 Vitamin D deficiency, unspecified: Secondary | ICD-10-CM | POA: Diagnosis not present

## 2023-04-29 DIAGNOSIS — C799 Secondary malignant neoplasm of unspecified site: Secondary | ICD-10-CM | POA: Diagnosis not present

## 2023-04-29 DIAGNOSIS — E785 Hyperlipidemia, unspecified: Secondary | ICD-10-CM | POA: Diagnosis not present

## 2023-04-29 DIAGNOSIS — K3189 Other diseases of stomach and duodenum: Secondary | ICD-10-CM | POA: Diagnosis not present

## 2023-04-29 DIAGNOSIS — Z7901 Long term (current) use of anticoagulants: Secondary | ICD-10-CM | POA: Diagnosis not present

## 2023-04-29 DIAGNOSIS — M87852 Other osteonecrosis, left femur: Secondary | ICD-10-CM | POA: Diagnosis not present

## 2023-04-29 DIAGNOSIS — Z91040 Latex allergy status: Secondary | ICD-10-CM | POA: Diagnosis not present

## 2023-04-29 DIAGNOSIS — M87052 Idiopathic aseptic necrosis of left femur: Secondary | ICD-10-CM | POA: Diagnosis not present

## 2023-04-29 DIAGNOSIS — R6889 Other general symptoms and signs: Secondary | ICD-10-CM | POA: Diagnosis not present

## 2023-04-29 DIAGNOSIS — M87051 Idiopathic aseptic necrosis of right femur: Secondary | ICD-10-CM | POA: Diagnosis not present

## 2023-04-29 DIAGNOSIS — E876 Hypokalemia: Secondary | ICD-10-CM | POA: Diagnosis not present

## 2023-04-29 DIAGNOSIS — R58 Hemorrhage, not elsewhere classified: Secondary | ICD-10-CM | POA: Diagnosis not present

## 2023-04-29 DIAGNOSIS — Z882 Allergy status to sulfonamides status: Secondary | ICD-10-CM | POA: Diagnosis not present

## 2023-04-29 DIAGNOSIS — M1990 Unspecified osteoarthritis, unspecified site: Secondary | ICD-10-CM | POA: Diagnosis not present

## 2023-04-29 DIAGNOSIS — R911 Solitary pulmonary nodule: Secondary | ICD-10-CM | POA: Diagnosis not present

## 2023-04-29 DIAGNOSIS — Z91041 Radiographic dye allergy status: Secondary | ICD-10-CM | POA: Diagnosis not present

## 2023-04-29 DIAGNOSIS — G620 Drug-induced polyneuropathy: Secondary | ICD-10-CM | POA: Diagnosis not present

## 2023-04-29 DIAGNOSIS — M87851 Other osteonecrosis, right femur: Secondary | ICD-10-CM | POA: Diagnosis not present

## 2023-04-29 DIAGNOSIS — F32A Depression, unspecified: Secondary | ICD-10-CM | POA: Diagnosis not present

## 2023-04-29 DIAGNOSIS — Z7984 Long term (current) use of oral hypoglycemic drugs: Secondary | ICD-10-CM | POA: Diagnosis not present

## 2023-04-29 DIAGNOSIS — I82401 Acute embolism and thrombosis of unspecified deep veins of right lower extremity: Secondary | ICD-10-CM | POA: Diagnosis not present

## 2023-04-29 DIAGNOSIS — T451X5D Adverse effect of antineoplastic and immunosuppressive drugs, subsequent encounter: Secondary | ICD-10-CM | POA: Diagnosis not present

## 2023-04-29 DIAGNOSIS — R059 Cough, unspecified: Secondary | ICD-10-CM | POA: Diagnosis not present

## 2023-04-29 DIAGNOSIS — M810 Age-related osteoporosis without current pathological fracture: Secondary | ICD-10-CM | POA: Diagnosis not present

## 2023-04-29 DIAGNOSIS — R1111 Vomiting without nausea: Secondary | ICD-10-CM | POA: Diagnosis not present

## 2023-04-29 DIAGNOSIS — M79606 Pain in leg, unspecified: Secondary | ICD-10-CM | POA: Diagnosis not present

## 2023-04-29 DIAGNOSIS — M797 Fibromyalgia: Secondary | ICD-10-CM | POA: Diagnosis not present

## 2023-04-29 DIAGNOSIS — Z853 Personal history of malignant neoplasm of breast: Secondary | ICD-10-CM | POA: Diagnosis not present

## 2023-04-29 DIAGNOSIS — Z743 Need for continuous supervision: Secondary | ICD-10-CM | POA: Diagnosis not present

## 2023-04-29 DIAGNOSIS — J449 Chronic obstructive pulmonary disease, unspecified: Secondary | ICD-10-CM | POA: Diagnosis not present

## 2023-04-29 DIAGNOSIS — M79604 Pain in right leg: Secondary | ICD-10-CM | POA: Diagnosis not present

## 2023-04-29 DIAGNOSIS — K5730 Diverticulosis of large intestine without perforation or abscess without bleeding: Secondary | ICD-10-CM | POA: Diagnosis not present

## 2023-04-29 DIAGNOSIS — R1031 Right lower quadrant pain: Secondary | ICD-10-CM | POA: Diagnosis not present

## 2023-04-29 DIAGNOSIS — R918 Other nonspecific abnormal finding of lung field: Secondary | ICD-10-CM | POA: Diagnosis not present

## 2024-01-05 DIAGNOSIS — Z7901 Long term (current) use of anticoagulants: Secondary | ICD-10-CM | POA: Diagnosis not present

## 2024-01-05 DIAGNOSIS — M1711 Unilateral primary osteoarthritis, right knee: Secondary | ICD-10-CM | POA: Diagnosis not present

## 2024-01-05 DIAGNOSIS — S72301A Unspecified fracture of shaft of right femur, initial encounter for closed fracture: Secondary | ICD-10-CM | POA: Diagnosis not present

## 2024-01-05 DIAGNOSIS — M84551A Pathological fracture in neoplastic disease, right femur, initial encounter for fracture: Secondary | ICD-10-CM | POA: Diagnosis not present

## 2024-01-05 DIAGNOSIS — M47816 Spondylosis without myelopathy or radiculopathy, lumbar region: Secondary | ICD-10-CM | POA: Diagnosis not present

## 2024-01-05 DIAGNOSIS — C801 Malignant (primary) neoplasm, unspecified: Secondary | ICD-10-CM | POA: Diagnosis not present

## 2024-01-05 DIAGNOSIS — M25572 Pain in left ankle and joints of left foot: Secondary | ICD-10-CM | POA: Diagnosis not present

## 2024-01-05 DIAGNOSIS — S79101A Unspecified physeal fracture of lower end of right femur, initial encounter for closed fracture: Secondary | ICD-10-CM | POA: Diagnosis not present

## 2024-01-05 DIAGNOSIS — C7989 Secondary malignant neoplasm of other specified sites: Secondary | ICD-10-CM | POA: Diagnosis not present

## 2024-01-05 DIAGNOSIS — N1832 Chronic kidney disease, stage 3b: Secondary | ICD-10-CM | POA: Diagnosis not present

## 2024-01-05 DIAGNOSIS — R9431 Abnormal electrocardiogram [ECG] [EKG]: Secondary | ICD-10-CM | POA: Diagnosis not present

## 2024-01-05 DIAGNOSIS — R531 Weakness: Secondary | ICD-10-CM | POA: Diagnosis not present

## 2024-01-05 DIAGNOSIS — R749 Abnormal serum enzyme level, unspecified: Secondary | ICD-10-CM | POA: Diagnosis not present

## 2024-01-05 DIAGNOSIS — M84451A Pathological fracture, right femur, initial encounter for fracture: Secondary | ICD-10-CM | POA: Diagnosis not present

## 2024-01-05 DIAGNOSIS — I77810 Thoracic aortic ectasia: Secondary | ICD-10-CM | POA: Diagnosis not present

## 2024-01-05 DIAGNOSIS — E1140 Type 2 diabetes mellitus with diabetic neuropathy, unspecified: Secondary | ICD-10-CM | POA: Diagnosis not present

## 2024-01-05 DIAGNOSIS — J4489 Other specified chronic obstructive pulmonary disease: Secondary | ICD-10-CM | POA: Diagnosis not present

## 2024-01-05 DIAGNOSIS — K219 Gastro-esophageal reflux disease without esophagitis: Secondary | ICD-10-CM | POA: Diagnosis not present

## 2024-01-05 DIAGNOSIS — M87051 Idiopathic aseptic necrosis of right femur: Secondary | ICD-10-CM | POA: Diagnosis not present

## 2024-01-05 DIAGNOSIS — Z743 Need for continuous supervision: Secondary | ICD-10-CM | POA: Diagnosis not present

## 2024-01-05 DIAGNOSIS — R918 Other nonspecific abnormal finding of lung field: Secondary | ICD-10-CM | POA: Diagnosis not present

## 2024-01-05 DIAGNOSIS — Z7401 Bed confinement status: Secondary | ICD-10-CM | POA: Diagnosis not present

## 2024-01-05 DIAGNOSIS — Z86711 Personal history of pulmonary embolism: Secondary | ICD-10-CM | POA: Diagnosis not present

## 2024-01-05 DIAGNOSIS — Z515 Encounter for palliative care: Secondary | ICD-10-CM | POA: Diagnosis not present

## 2024-01-05 DIAGNOSIS — C3490 Malignant neoplasm of unspecified part of unspecified bronchus or lung: Secondary | ICD-10-CM | POA: Diagnosis not present

## 2024-01-05 DIAGNOSIS — E876 Hypokalemia: Secondary | ICD-10-CM | POA: Diagnosis not present

## 2024-01-05 DIAGNOSIS — R000 Tachycardia, unspecified: Secondary | ICD-10-CM | POA: Diagnosis not present

## 2024-01-05 DIAGNOSIS — I130 Hypertensive heart and chronic kidney disease with heart failure and stage 1 through stage 4 chronic kidney disease, or unspecified chronic kidney disease: Secondary | ICD-10-CM | POA: Diagnosis not present

## 2024-01-05 DIAGNOSIS — I2699 Other pulmonary embolism without acute cor pulmonale: Secondary | ICD-10-CM | POA: Diagnosis not present

## 2024-01-05 DIAGNOSIS — I5042 Chronic combined systolic (congestive) and diastolic (congestive) heart failure: Secondary | ICD-10-CM | POA: Diagnosis not present

## 2024-01-05 DIAGNOSIS — S7291XA Unspecified fracture of right femur, initial encounter for closed fracture: Secondary | ICD-10-CM | POA: Diagnosis not present

## 2024-01-05 DIAGNOSIS — W1839XA Other fall on same level, initial encounter: Secondary | ICD-10-CM | POA: Diagnosis not present

## 2024-01-05 DIAGNOSIS — W19XXXA Unspecified fall, initial encounter: Secondary | ICD-10-CM | POA: Diagnosis not present

## 2024-01-05 DIAGNOSIS — E1122 Type 2 diabetes mellitus with diabetic chronic kidney disease: Secondary | ICD-10-CM | POA: Diagnosis not present

## 2024-01-05 DIAGNOSIS — I471 Supraventricular tachycardia, unspecified: Secondary | ICD-10-CM | POA: Diagnosis not present

## 2024-01-05 DIAGNOSIS — N189 Chronic kidney disease, unspecified: Secondary | ICD-10-CM | POA: Diagnosis not present

## 2024-01-05 DIAGNOSIS — Z66 Do not resuscitate: Secondary | ICD-10-CM | POA: Diagnosis not present

## 2024-01-05 DIAGNOSIS — Z86718 Personal history of other venous thrombosis and embolism: Secondary | ICD-10-CM | POA: Diagnosis not present

## 2024-01-05 DIAGNOSIS — G43909 Migraine, unspecified, not intractable, without status migrainosus: Secondary | ICD-10-CM | POA: Diagnosis not present

## 2024-01-05 DIAGNOSIS — R7989 Other specified abnormal findings of blood chemistry: Secondary | ICD-10-CM | POA: Diagnosis not present

## 2024-01-05 DIAGNOSIS — Z8719 Personal history of other diseases of the digestive system: Secondary | ICD-10-CM | POA: Diagnosis not present

## 2024-01-05 DIAGNOSIS — R600 Localized edema: Secondary | ICD-10-CM | POA: Diagnosis not present

## 2024-01-05 DIAGNOSIS — E119 Type 2 diabetes mellitus without complications: Secondary | ICD-10-CM | POA: Diagnosis not present

## 2024-01-05 DIAGNOSIS — S7291XD Unspecified fracture of right femur, subsequent encounter for closed fracture with routine healing: Secondary | ICD-10-CM | POA: Diagnosis not present

## 2024-01-05 DIAGNOSIS — Z79899 Other long term (current) drug therapy: Secondary | ICD-10-CM | POA: Diagnosis not present

## 2024-01-05 DIAGNOSIS — D509 Iron deficiency anemia, unspecified: Secondary | ICD-10-CM | POA: Diagnosis not present

## 2024-01-05 DIAGNOSIS — M25551 Pain in right hip: Secondary | ICD-10-CM | POA: Diagnosis not present

## 2024-01-05 DIAGNOSIS — M87851 Other osteonecrosis, right femur: Secondary | ICD-10-CM | POA: Diagnosis not present

## 2024-01-05 DIAGNOSIS — F17220 Nicotine dependence, chewing tobacco, uncomplicated: Secondary | ICD-10-CM | POA: Diagnosis not present

## 2024-01-05 DIAGNOSIS — I071 Rheumatic tricuspid insufficiency: Secondary | ICD-10-CM | POA: Diagnosis not present

## 2024-01-05 DIAGNOSIS — S72351A Displaced comminuted fracture of shaft of right femur, initial encounter for closed fracture: Secondary | ICD-10-CM | POA: Diagnosis not present

## 2024-01-05 DIAGNOSIS — E785 Hyperlipidemia, unspecified: Secondary | ICD-10-CM | POA: Diagnosis not present

## 2024-01-05 DIAGNOSIS — I447 Left bundle-branch block, unspecified: Secondary | ICD-10-CM | POA: Diagnosis not present

## 2024-01-05 DIAGNOSIS — C50912 Malignant neoplasm of unspecified site of left female breast: Secondary | ICD-10-CM | POA: Diagnosis not present

## 2024-01-05 DIAGNOSIS — S72001A Fracture of unspecified part of neck of right femur, initial encounter for closed fracture: Secondary | ICD-10-CM | POA: Diagnosis not present

## 2024-01-05 DIAGNOSIS — C787 Secondary malignant neoplasm of liver and intrahepatic bile duct: Secondary | ICD-10-CM | POA: Diagnosis not present

## 2024-01-05 DIAGNOSIS — I5020 Unspecified systolic (congestive) heart failure: Secondary | ICD-10-CM | POA: Diagnosis not present

## 2024-01-05 DIAGNOSIS — M1611 Unilateral primary osteoarthritis, right hip: Secondary | ICD-10-CM | POA: Diagnosis not present

## 2024-01-05 DIAGNOSIS — R0600 Dyspnea, unspecified: Secondary | ICD-10-CM | POA: Diagnosis not present

## 2024-01-05 DIAGNOSIS — Z8639 Personal history of other endocrine, nutritional and metabolic disease: Secondary | ICD-10-CM | POA: Diagnosis not present

## 2024-01-05 DIAGNOSIS — E7800 Pure hypercholesterolemia, unspecified: Secondary | ICD-10-CM | POA: Diagnosis not present

## 2024-01-05 DIAGNOSIS — S79911A Unspecified injury of right hip, initial encounter: Secondary | ICD-10-CM | POA: Diagnosis not present

## 2024-01-05 DIAGNOSIS — C50919 Malignant neoplasm of unspecified site of unspecified female breast: Secondary | ICD-10-CM | POA: Diagnosis not present

## 2024-01-13 DIAGNOSIS — S7291XD Unspecified fracture of right femur, subsequent encounter for closed fracture with routine healing: Secondary | ICD-10-CM | POA: Diagnosis not present

## 2024-01-13 DIAGNOSIS — I825Z1 Chronic embolism and thrombosis of unspecified deep veins of right distal lower extremity: Secondary | ICD-10-CM | POA: Diagnosis not present

## 2024-01-13 DIAGNOSIS — R5381 Other malaise: Secondary | ICD-10-CM | POA: Diagnosis not present

## 2024-01-13 DIAGNOSIS — C7800 Secondary malignant neoplasm of unspecified lung: Secondary | ICD-10-CM | POA: Diagnosis not present

## 2024-01-13 DIAGNOSIS — J449 Chronic obstructive pulmonary disease, unspecified: Secondary | ICD-10-CM | POA: Diagnosis not present

## 2024-01-13 DIAGNOSIS — E559 Vitamin D deficiency, unspecified: Secondary | ICD-10-CM | POA: Diagnosis not present

## 2024-01-13 DIAGNOSIS — I10 Essential (primary) hypertension: Secondary | ICD-10-CM | POA: Diagnosis not present

## 2024-01-13 DIAGNOSIS — E1122 Type 2 diabetes mellitus with diabetic chronic kidney disease: Secondary | ICD-10-CM | POA: Diagnosis not present

## 2024-01-13 DIAGNOSIS — M6281 Muscle weakness (generalized): Secondary | ICD-10-CM | POA: Diagnosis not present

## 2024-01-13 DIAGNOSIS — K219 Gastro-esophageal reflux disease without esophagitis: Secondary | ICD-10-CM | POA: Diagnosis not present

## 2024-01-13 DIAGNOSIS — C787 Secondary malignant neoplasm of liver and intrahepatic bile duct: Secondary | ICD-10-CM | POA: Diagnosis not present

## 2024-01-19 DIAGNOSIS — S7291XA Unspecified fracture of right femur, initial encounter for closed fracture: Secondary | ICD-10-CM | POA: Diagnosis not present

## 2024-01-19 DIAGNOSIS — R5381 Other malaise: Secondary | ICD-10-CM | POA: Diagnosis not present

## 2024-01-22 DIAGNOSIS — S72331D Displaced oblique fracture of shaft of right femur, subsequent encounter for closed fracture with routine healing: Secondary | ICD-10-CM | POA: Diagnosis not present

## 2024-01-22 DIAGNOSIS — M25551 Pain in right hip: Secondary | ICD-10-CM | POA: Diagnosis not present

## 2024-01-22 DIAGNOSIS — R936 Abnormal findings on diagnostic imaging of limbs: Secondary | ICD-10-CM | POA: Diagnosis not present

## 2024-01-22 DIAGNOSIS — Z9889 Other specified postprocedural states: Secondary | ICD-10-CM | POA: Diagnosis not present

## 2024-01-24 DIAGNOSIS — R9431 Abnormal electrocardiogram [ECG] [EKG]: Secondary | ICD-10-CM | POA: Diagnosis not present

## 2024-01-24 DIAGNOSIS — I447 Left bundle-branch block, unspecified: Secondary | ICD-10-CM | POA: Diagnosis not present

## 2024-01-24 DIAGNOSIS — M797 Fibromyalgia: Secondary | ICD-10-CM | POA: Diagnosis not present

## 2024-01-24 DIAGNOSIS — I825Z1 Chronic embolism and thrombosis of unspecified deep veins of right distal lower extremity: Secondary | ICD-10-CM | POA: Diagnosis not present

## 2024-01-24 DIAGNOSIS — R531 Weakness: Secondary | ICD-10-CM | POA: Diagnosis not present

## 2024-01-24 DIAGNOSIS — J449 Chronic obstructive pulmonary disease, unspecified: Secondary | ICD-10-CM | POA: Diagnosis not present

## 2024-01-24 DIAGNOSIS — R1319 Other dysphagia: Secondary | ICD-10-CM | POA: Diagnosis not present

## 2024-01-24 DIAGNOSIS — I5020 Unspecified systolic (congestive) heart failure: Secondary | ICD-10-CM | POA: Diagnosis not present

## 2024-01-24 DIAGNOSIS — E559 Vitamin D deficiency, unspecified: Secondary | ICD-10-CM | POA: Diagnosis not present

## 2024-01-24 DIAGNOSIS — C50919 Malignant neoplasm of unspecified site of unspecified female breast: Secondary | ICD-10-CM | POA: Diagnosis not present

## 2024-01-24 DIAGNOSIS — C7800 Secondary malignant neoplasm of unspecified lung: Secondary | ICD-10-CM | POA: Diagnosis not present

## 2024-01-24 DIAGNOSIS — R262 Difficulty in walking, not elsewhere classified: Secondary | ICD-10-CM | POA: Diagnosis not present

## 2024-01-24 DIAGNOSIS — S7291XA Unspecified fracture of right femur, initial encounter for closed fracture: Secondary | ICD-10-CM | POA: Diagnosis not present

## 2024-01-24 DIAGNOSIS — E1122 Type 2 diabetes mellitus with diabetic chronic kidney disease: Secondary | ICD-10-CM | POA: Diagnosis not present

## 2024-01-24 DIAGNOSIS — S7291XD Unspecified fracture of right femur, subsequent encounter for closed fracture with routine healing: Secondary | ICD-10-CM | POA: Diagnosis not present

## 2024-01-24 DIAGNOSIS — J302 Other seasonal allergic rhinitis: Secondary | ICD-10-CM | POA: Diagnosis not present

## 2024-01-24 DIAGNOSIS — I10 Essential (primary) hypertension: Secondary | ICD-10-CM | POA: Diagnosis not present

## 2024-01-24 DIAGNOSIS — J99 Respiratory disorders in diseases classified elsewhere: Secondary | ICD-10-CM | POA: Diagnosis not present

## 2024-01-24 DIAGNOSIS — R5381 Other malaise: Secondary | ICD-10-CM | POA: Diagnosis not present

## 2024-01-24 DIAGNOSIS — M6281 Muscle weakness (generalized): Secondary | ICD-10-CM | POA: Diagnosis not present

## 2024-01-24 DIAGNOSIS — K219 Gastro-esophageal reflux disease without esophagitis: Secondary | ICD-10-CM | POA: Diagnosis not present

## 2024-01-24 DIAGNOSIS — E441 Mild protein-calorie malnutrition: Secondary | ICD-10-CM | POA: Diagnosis not present

## 2024-01-24 DIAGNOSIS — C787 Secondary malignant neoplasm of liver and intrahepatic bile duct: Secondary | ICD-10-CM | POA: Diagnosis not present

## 2024-01-24 DIAGNOSIS — Z7401 Bed confinement status: Secondary | ICD-10-CM | POA: Diagnosis not present

## 2024-02-03 DIAGNOSIS — I10 Essential (primary) hypertension: Secondary | ICD-10-CM | POA: Diagnosis not present

## 2024-02-03 DIAGNOSIS — I825Z1 Chronic embolism and thrombosis of unspecified deep veins of right distal lower extremity: Secondary | ICD-10-CM | POA: Diagnosis not present

## 2024-02-03 DIAGNOSIS — R5381 Other malaise: Secondary | ICD-10-CM | POA: Diagnosis not present

## 2024-02-03 DIAGNOSIS — S7291XA Unspecified fracture of right femur, initial encounter for closed fracture: Secondary | ICD-10-CM | POA: Diagnosis not present

## 2024-02-03 DIAGNOSIS — S7291XD Unspecified fracture of right femur, subsequent encounter for closed fracture with routine healing: Secondary | ICD-10-CM | POA: Diagnosis not present

## 2024-02-04 DIAGNOSIS — S7291XD Unspecified fracture of right femur, subsequent encounter for closed fracture with routine healing: Secondary | ICD-10-CM | POA: Diagnosis not present

## 2024-02-04 DIAGNOSIS — M6281 Muscle weakness (generalized): Secondary | ICD-10-CM | POA: Diagnosis not present

## 2024-02-04 DIAGNOSIS — C7800 Secondary malignant neoplasm of unspecified lung: Secondary | ICD-10-CM | POA: Diagnosis not present

## 2024-02-04 DIAGNOSIS — K219 Gastro-esophageal reflux disease without esophagitis: Secondary | ICD-10-CM | POA: Diagnosis not present

## 2024-02-04 DIAGNOSIS — J449 Chronic obstructive pulmonary disease, unspecified: Secondary | ICD-10-CM | POA: Diagnosis not present

## 2024-02-04 DIAGNOSIS — S7291XA Unspecified fracture of right femur, initial encounter for closed fracture: Secondary | ICD-10-CM | POA: Diagnosis not present

## 2024-02-04 DIAGNOSIS — E559 Vitamin D deficiency, unspecified: Secondary | ICD-10-CM | POA: Diagnosis not present

## 2024-02-04 DIAGNOSIS — R5381 Other malaise: Secondary | ICD-10-CM | POA: Diagnosis not present

## 2024-02-04 DIAGNOSIS — E1122 Type 2 diabetes mellitus with diabetic chronic kidney disease: Secondary | ICD-10-CM | POA: Diagnosis not present

## 2024-02-04 DIAGNOSIS — C787 Secondary malignant neoplasm of liver and intrahepatic bile duct: Secondary | ICD-10-CM | POA: Diagnosis not present

## 2024-02-04 DIAGNOSIS — I10 Essential (primary) hypertension: Secondary | ICD-10-CM | POA: Diagnosis not present

## 2024-02-04 DIAGNOSIS — I825Z1 Chronic embolism and thrombosis of unspecified deep veins of right distal lower extremity: Secondary | ICD-10-CM | POA: Diagnosis not present

## 2024-02-05 DIAGNOSIS — I447 Left bundle-branch block, unspecified: Secondary | ICD-10-CM | POA: Diagnosis not present

## 2024-02-05 DIAGNOSIS — R9431 Abnormal electrocardiogram [ECG] [EKG]: Secondary | ICD-10-CM | POA: Diagnosis not present

## 2024-02-24 DEATH — deceased
